# Patient Record
Sex: Female | Born: 1995 | Race: White | Hispanic: No | Marital: Single | Smoking: Never smoker
Health system: Southern US, Community
[De-identification: ages and names within clinical notes are randomized; demographics above are authoritative.]

## PROBLEM LIST (undated history)

## (undated) ENCOUNTER — Ambulatory Visit (HOSPITAL_COMMUNITY): Admission: EM | Payer: Self-pay | Source: Home / Self Care

## (undated) ENCOUNTER — Inpatient Hospital Stay (HOSPITAL_COMMUNITY): Payer: Self-pay

## (undated) DIAGNOSIS — Z789 Other specified health status: Secondary | ICD-10-CM

## (undated) DIAGNOSIS — N812 Incomplete uterovaginal prolapse: Secondary | ICD-10-CM

## (undated) HISTORY — PX: EYE SURGERY: SHX253

## (undated) HISTORY — DX: Other specified health status: Z78.9

---
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
## 2017-01-09 ENCOUNTER — Ambulatory Visit: Payer: BLUE CROSS/BLUE SHIELD | Admitting: Adult Health

## 2017-01-09 ENCOUNTER — Encounter: Payer: Self-pay | Admitting: Adult Health

## 2017-01-09 ENCOUNTER — Encounter (INDEPENDENT_AMBULATORY_CARE_PROVIDER_SITE_OTHER): Payer: Self-pay

## 2017-01-09 VITALS — BP 98/50 | HR 70 | Resp 18 | Ht 59.0 in | Wt 91.0 lb

## 2017-01-09 DIAGNOSIS — N898 Other specified noninflammatory disorders of vagina: Secondary | ICD-10-CM | POA: Diagnosis not present

## 2017-01-09 DIAGNOSIS — B373 Candidiasis of vulva and vagina: Secondary | ICD-10-CM | POA: Insufficient documentation

## 2017-01-09 DIAGNOSIS — B3731 Acute candidiasis of vulva and vagina: Secondary | ICD-10-CM

## 2017-01-09 LAB — POCT WET PREP (WET MOUNT)

## 2017-01-09 MED ORDER — FLUCONAZOLE 150 MG PO TABS: ORAL_TABLET | ORAL | 0 refills | Status: DC

## 2017-01-09 NOTE — Progress Notes (Signed)
Subjective:     Patient ID: Mary Singleton, female   DOB: 04-30-1995, 21 y.o.   MRN: [id number]  HPI Mary Singleton is a 21 year old white female, in complaining of vaginal discharge for about a week, with some burning. She says it can be yellowish.   Review of Systems +vaginal discharge +burns in vagina No sex since 4315 Reviewed past medical,surgical, social and family history. Reviewed medications and allergies.     Objective:   Physical Exam BP (!) 98/50 (BP Location: Left Arm, Patient Position: Sitting, Cuff Size: Normal)   Pulse 70   Resp 18   Ht 4\' 11"  (1.499 m)   Wt 91 lb (41.3 kg)   BMI 18.38 kg/m PHQ 2 score 0. Skin warm and dry.Pelvic: external genitalia is normal in appearance no lesions, vagina: white discharge without odor,side walls red,urethra has no lesions or masses noted, cervix:smooth and tiny, uterus: normal size, shape and contour, non tender, no masses felt, adnexa: no masses or tenderness noted. Bladder is non tender and no masses felt. Wet prep: + few yeast and +WBCs.     Assessment:       1. Vaginal discharge   2. Yeast vaginitis       Plan:    Review handout on yeast infections Rx Diflucan 150 mg #2 take 1 now and 1 in 3 days if needed F/U prn

## 2017-01-09 NOTE — Patient Instructions (Signed)

## 2017-03-12 ENCOUNTER — Other Ambulatory Visit (HOSPITAL_COMMUNITY): Payer: Self-pay | Admitting: Family Medicine

## 2017-03-12 ENCOUNTER — Ambulatory Visit (HOSPITAL_COMMUNITY)
Admission: RE | Admit: 2017-03-12 | Discharge: 2017-03-12 | Disposition: A | Discharge status: Home / Self Care | Payer: BLUE CROSS/BLUE SHIELD | Source: Ambulatory Visit | Attending: Family Medicine | Admitting: Family Medicine

## 2017-03-12 DIAGNOSIS — R1031 Right lower quadrant pain: Secondary | ICD-10-CM | POA: Diagnosis not present

## 2017-03-12 MED ORDER — IOPAMIDOL (ISOVUE-300) INJECTION 61%
100.0000 mL | Freq: Once | INTRAVENOUS | Status: AC | PRN
  Administered 2017-03-12: 100 mL via INTRAVENOUS

## 2017-03-22 ENCOUNTER — Encounter (INDEPENDENT_AMBULATORY_CARE_PROVIDER_SITE_OTHER): Payer: Self-pay | Admitting: Internal Medicine

## 2017-03-22 ENCOUNTER — Encounter (INDEPENDENT_AMBULATORY_CARE_PROVIDER_SITE_OTHER): Payer: Self-pay | Admitting: *Deleted

## 2017-03-22 ENCOUNTER — Ambulatory Visit (INDEPENDENT_AMBULATORY_CARE_PROVIDER_SITE_OTHER): Payer: BLUE CROSS/BLUE SHIELD | Admitting: Internal Medicine

## 2017-03-22 ENCOUNTER — Encounter (HOSPITAL_COMMUNITY)
Admission: RE | Admit: 2017-03-22 | Discharge: 2017-03-22 | Disposition: A | Discharge status: Home / Self Care | Payer: BLUE CROSS/BLUE SHIELD | Source: Ambulatory Visit | Attending: Internal Medicine | Admitting: Internal Medicine

## 2017-03-22 VITALS — BP 108/80 | HR 76 | Temp 98.4°F | Ht 59.0 in | Wt 91.2 lb

## 2017-03-22 DIAGNOSIS — K311 Adult hypertrophic pyloric stenosis: Secondary | ICD-10-CM | POA: Diagnosis not present

## 2017-03-22 NOTE — Progress Notes (Signed)
   Subjective:    Patient ID: Mary Singleton, female    DOB: January 14, 1996, 22 y.o.   MRN: [id number]  HPI Referred by DR. Fusco for possible outlet obstruction. Saw Dr Sherwood GamblerFusco with rt upper abdominal. She says the pain started about 2 weeks ago. She says she can feel a lump in her abdomen. No fever. No nausea or vomiting.  Having 2 -3 BMs a day.  Stools are loose and watery. Stools have been loose for about 2 weeks. Her father states her appetite is not as good as it usually is. The pain usually starts after she eats.  Occasionally takes Advil when she has her period. Denies ever having this type of pain before.  Rates the pain 6/10. She has not eaten today.      On birth control. She is not sexually active. LMP 1 day ago.   Underwent a CT abdomen/pelvis with CM for abdominal pain which revealed;  IMPRESSION: 1. No evidence of mass in the subcutaneous tissues of the infraumbilical region in the area of palpable concern. The distal stomach underlies this location. 2. Wall thickening involving the gastric antrum, duodenal bulb and descending duodenum. 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 If the patient currently has abdominal pain, gastritis and duodenitis can have a similar appearance, though this is felt less likely. 3. No acute or significant abnormalities otherwise.    Review of Systems History reviewed. No pertinent past medical history.  Past Surgical History:  Procedure Laterality Date  . EYE SURGERY      No Known Allergies  Current Outpatient Medications on File Prior to Visit  Medication Sig Dispense Refill  . JUNEL 1/20 1-20 MG-MCG tablet Take 1 tablet by mouth daily.  6   No current facility-administered medications on file prior to visit.         Objective:   Physical Exam Blood pressure  108/80, pulse 76, temperature 98.4 F (36.9 C), height 4\' 11"  (1.499 m), weight 91 lb 3.2 oz (41.4 kg), last menstrual period 02/26/2017. Alert and oriented. Skin warm and dry. Oral mucosa is moist.   . Sclera anicteric, conjunctivae is pink. Thyroid not enlarged. No cervical lymphadenopathy. Lungs clear. Heart regular rate and rhythm.  Abdomen is soft. Bowel sounds are positive. No hepatomegaly. No abdominal masses felt. Tenderness rt mid abdomen.  No edema to lower extremities.  .        Assessment & Plan:  Possible gastric out obstruction: I discussed with Dr. Karilyn Cotaehman, EGD.

## 2017-03-22 NOTE — Patient Instructions (Signed)
Mary Singleton  03/22/2017     @PREFPERIOPPHARMACY @   Your procedure is scheduled on  03/25/2017 .  Report to [facility] at   1045   A.M.  Call this number if you have problems the morning of surgery:  [phone]   Remember:  Do not eat food or drink liquids after midnight.  Take these medicines the morning of surgery with A SIP OF WATER  None   Do not wear jewelry, make-up or nail polish.  Do not wear lotions, powders, or perfumes, or deodorant.  Do not shave 48 hours prior to surgery.  Men may shave face and neck.  Do not bring valuables to the hospital.  [facility] is not responsible for any belongings or valuables.  Contacts, dentures or bridgework may not be worn into surgery.  Leave your suitcase in the car.  After surgery it may be brought to your room.  For patients admitted to the hospital, discharge time will be determined by your treatment team.  Patients discharged the day of surgery will not be allowed to drive home.   Name and phone number of your driver:   family Special instructions:  Follow the diet instructions given to you by Dr Patty Sermons office.  Please read over the following fact sheets that you were given. Anesthesia Post-op Instructions and Care and Recovery After Surgery       Esophagogastroduodenoscopy Esophagogastroduodenoscopy (EGD) is a procedure to examine the lining of the esophagus, stomach, and first part of the small intestine (duodenum). This procedure is done to check for problems such as inflammation, bleeding, ulcers, or growths. During this procedure, a long, flexible, lighted tube with a camera attached (endoscope) is inserted down the throat. Tell a health care provider about:  Any allergies you have.  All medicines you are taking, including vitamins, herbs, eye drops, creams, and over-the-counter medicines.  Any problems you or family members have had with anesthetic medicines.  Any blood disorders you  have.  Any surgeries you have had.  Any medical conditions you have.  Whether you are pregnant or may be pregnant. What are the risks? Generally, this is a safe procedure. However, problems may occur, including:  Infection.  Bleeding.  A tear (perforation) in the esophagus, stomach, or duodenum.  Trouble breathing.  Excessive sweating.  Spasms of the larynx.  A slowed heartbeat.  Low blood pressure.  What happens before the procedure?  Follow instructions from your health care provider about eating or drinking restrictions.  Ask your health care provider about: ? Changing or stopping your regular medicines. This is especially important if you are taking diabetes medicines or blood thinners. ? Taking medicines such as aspirin and ibuprofen. These medicines can thin your blood. Do not take these medicines before your procedure if your health care provider instructs you not to.  Plan to have someone take you home after the procedure.  If you wear dentures, be ready to remove them before the procedure. What happens during the procedure?  To reduce your risk of infection, your health care team will wash or sanitize their hands.  An IV tube will be put in a vein in your hand or arm. You will get medicines and fluids through this tube.  You will be given one or more of the following: ? A medicine to help you relax (sedative). ? A medicine to numb the area (local anesthetic). This medicine may be sprayed into  your throat. It will make you feel more comfortable and keep you from gagging or coughing during the procedure. ? A medicine for pain.  A mouth guard may be placed in your mouth to protect your teeth and to keep you from biting on the endoscope.  You will be asked to lie on your left side.  The endoscope will be lowered down your throat into your esophagus, stomach, and duodenum.  Air will be put into the endoscope. This will help your health care provider see  better.  The lining of your esophagus, stomach, and duodenum will be examined.  Your health care provider may: ? Take a tissue sample so it can be looked at in a lab (biopsy). ? Remove growths. ? Remove objects (foreign bodies) that are stuck. ? Treat any bleeding with medicines or other devices that stop tissue from bleeding. ? Widen (dilate) or stretch narrowed areas of your esophagus and stomach.  The endoscope will be taken out. The procedure may vary among health care providers and hospitals. What happens after the procedure?  Your blood pressure, heart rate, breathing rate, and blood oxygen level will be monitored often until the medicines you were given have worn off.  Do not eat or drink anything until the numbing medicine has worn off and your gag reflex has returned. This information is not intended to replace advice given to you by your health care provider. Make sure you discuss any questions you have with your health care provider. Document Released: 06/22/2004 Document Revised: 07/28/2015 Document Reviewed: 01/13/2015 Elsevier Interactive Patient Education  2018 [employer]. Esophagogastroduodenoscopy, Care After Refer to this sheet in the next few weeks. These instructions provide you with information about caring for yourself after your procedure. Your health care provider may also give you more specific instructions. Your treatment has been planned according to current medical practices, but problems sometimes occur. Call your health care provider if you have any problems or questions after your procedure. What can I expect after the procedure? After the procedure, it is common to have:  A sore throat.  Nausea.  Bloating.  Dizziness.  Fatigue.  Follow these instructions at home:  Do not eat or drink anything until the numbing medicine (local anesthetic) has worn off and your gag reflex has returned. You will know that the local anesthetic has worn off when you  can swallow comfortably.  Do not drive for 24 hours if you received a medicine to help you relax (sedative).  If your health care provider took a tissue sample for testing during the procedure, make sure to get your test results. This is your responsibility. Ask your health care provider or the department performing the test when your results will be ready.  Keep all follow-up visits as told by your health care provider. This is important. Contact a health care provider if:  You cannot stop coughing.  You are not urinating.  You are urinating less than usual. Get help right away if:  You have trouble swallowing.  You cannot eat or drink.  You have throat or chest pain that gets worse.  You are dizzy or light-headed.  You faint.  You have nausea or vomiting.  You have chills.  You have a fever.  You have severe abdominal pain.  You have black, tarry, or bloody stools. This information is not intended to replace advice given to you by your health care provider. Make sure you discuss any questions you have with your health care  provider. Document Released: 02/06/2012 Document Revised: 07/28/2015 Document Reviewed: 01/13/2015 Elsevier Interactive Patient Education  2018 [employer]  Monitored Anesthesia Care Anesthesia is a term that refers to techniques, procedures, and medicines that help a person stay safe and comfortable during a medical procedure. Monitored anesthesia care, or sedation, is one type of anesthesia. Your anesthesia specialist may recommend sedation if you will be having a procedure that does not require you to be unconscious, such as:  Cataract surgery.  A dental procedure.  A biopsy.  A colonoscopy.  During the procedure, you may receive a medicine to help you relax (sedative). There are three levels of sedation:  Mild sedation. At this level, you may feel awake and relaxed. You will be able to follow directions.  Moderate sedation. At this  level, you will be sleepy. You may not remember the procedure.  Deep sedation. At this level, you will be asleep. You will not remember the procedure.  The more medicine you are given, the deeper your level of sedation will be. Depending on how you respond to the procedure, the anesthesia specialist may change your level of sedation or the type of anesthesia to fit your needs. An anesthesia specialist will monitor you closely during the procedure. Let your health care provider know about:  Any allergies you have.  All medicines you are taking, including vitamins, herbs, eye drops, creams, and over-the-counter medicines.  Any use of steroids (by mouth or as a cream).  Any problems you or family members have had with sedatives and anesthetic medicines.  Any blood disorders you have.  Any surgeries you have had.  Any medical conditions you have, such as sleep apnea.  Whether you are pregnant or may be pregnant.  Any use of cigarettes, alcohol, or street drugs. What are the risks? Generally, this is a safe procedure. However, problems may occur, including:  Getting too much medicine (oversedation).  Nausea.  Allergic reaction to medicines.  Trouble breathing. If this happens, a breathing tube may be used to help with breathing. It will be removed when you are awake and breathing on your own.  Heart trouble.  Lung trouble.  Before the procedure Staying hydrated Follow instructions from your health care provider about hydration, which may include:  Up to 2 hours before the procedure - you may continue to drink clear liquids, such as water, clear fruit juice, black coffee, and plain tea.  Eating and drinking restrictions Follow instructions from your health care provider about eating and drinking, which may include:  8 hours before the procedure - stop eating heavy meals or foods such as meat, fried foods, or fatty foods.  6 hours before the procedure - stop eating light  meals or foods, such as toast or cereal.  6 hours before the procedure - stop drinking milk or drinks that contain milk.  2 hours before the procedure - stop drinking clear liquids.  Medicines Ask your health care provider about:  Changing or stopping your regular medicines. This is especially important if you are taking diabetes medicines or blood thinners.  Taking medicines such as aspirin and ibuprofen. These medicines can thin your blood. Do not take these medicines before your procedure if your health care provider instructs you not to.  Tests and exams  You will have a physical exam.  You may have blood tests done to show: ? How well your kidneys and liver are working. ? How well your blood can clot.  General instructions  Plan to  have someone take you home from the hospital or clinic.  If you will be going home right after the procedure, plan to have someone with you for 24 hours.  What happens during the procedure?  Your blood pressure, heart rate, breathing, level of pain and overall condition will be monitored.  An IV tube will be inserted into one of your veins.  Your anesthesia specialist will give you medicines as needed to keep you comfortable during the procedure. This may mean changing the level of sedation.  The procedure will be performed. After the procedure  Your blood pressure, heart rate, breathing rate, and blood oxygen level will be monitored until the medicines you were given have worn off.  Do not drive for 24 hours if you received a sedative.  You may: ? Feel sleepy, clumsy, or nauseous. ? Feel forgetful about what happened after the procedure. ? Have a sore throat if you had a breathing tube during the procedure. ? Vomit. This information is not intended to replace advice given to you by your health care provider. Make sure you discuss any questions you have with your health care provider. Document Released: 11/15/2004 Document Revised:  07/29/2015 Document Reviewed: 06/12/2015 Elsevier Interactive Patient Education  2018 [employer]. Monitored Anesthesia Care, Care After These instructions provide you with information about caring for yourself after your procedure. Your health care provider may also give you more specific instructions. Your treatment has been planned according to current medical practices, but problems sometimes occur. Call your health care provider if you have any problems or questions after your procedure. What can I expect after the procedure? After your procedure, it is common to:  Feel sleepy for several hours.  Feel clumsy and have poor balance for several hours.  Feel forgetful about what happened after the procedure.  Have poor judgment for several hours.  Feel nauseous or vomit.  Have a sore throat if you had a breathing tube during the procedure.  Follow these instructions at home: For at least 24 hours after the procedure:   Do not: ? Participate in activities in which you could fall or become injured. ? Drive. ? Use heavy machinery. ? Drink alcohol. ? Take sleeping pills or medicines that cause drowsiness. ? Make important decisions or sign legal documents. ? Take care of children on your own.  Rest. Eating and drinking  Follow the diet that is recommended by your health care provider.  If you vomit, drink water, juice, or soup when you can drink without vomiting.  Make sure you have little or no nausea before eating solid foods. General instructions  Have a responsible adult stay with you until you are awake and alert.  Take over-the-counter and prescription medicines only as told by your health care provider.  If you smoke, do not smoke without supervision.  Keep all follow-up visits as told by your health care provider. This is important. Contact a health care provider if:  You keep feeling nauseous or you keep vomiting.  You feel light-headed.  You develop a  rash.  You have a fever. Get help right away if:  You have trouble breathing. This information is not intended to replace advice given to you by your health care provider. Make sure you discuss any questions you have with your health care provider. Document Released: 06/12/2015 Document Revised: 10/12/2015 Document Reviewed: 06/12/2015 Elsevier Interactive Patient Education  [employer].

## 2017-03-22 NOTE — Progress Notes (Signed)
Pt. Arrived for pre-op interview with her father.  Upon assessment pt. Stated that she did not understand procedure & did not want to have it done.  Pt. Became very tearful & emotional. Father was very adamant that she needed to have the procedure done.  I explained to the pt. & father that it was ultimately her decision & that I would get Dr. Karilyn Cotaehman to come & speak with her.  Dr. Karilyn Cotaehman came & talked to the pt. & her father.  And it was decided that the procedure could held off for now.  Pt. Was agreeable.

## 2017-03-25 ENCOUNTER — Ambulatory Visit (HOSPITAL_COMMUNITY)
Admission: RE | Admit: 2017-03-25 | Payer: BLUE CROSS/BLUE SHIELD | Source: Ambulatory Visit | Admitting: Internal Medicine

## 2017-03-25 ENCOUNTER — Encounter (HOSPITAL_COMMUNITY): Admission: RE | Payer: Self-pay | Source: Ambulatory Visit

## 2017-03-25 SURGERY — ESOPHAGOGASTRODUODENOSCOPY (EGD) WITH PROPOFOL
Anesthesia: Monitor Anesthesia Care

## 2017-07-11 ENCOUNTER — Other Ambulatory Visit (INDEPENDENT_AMBULATORY_CARE_PROVIDER_SITE_OTHER): Payer: Self-pay | Admitting: Internal Medicine

## 2017-07-11 ENCOUNTER — Encounter (INDEPENDENT_AMBULATORY_CARE_PROVIDER_SITE_OTHER): Payer: Self-pay | Admitting: Internal Medicine

## 2017-07-11 ENCOUNTER — Ambulatory Visit (INDEPENDENT_AMBULATORY_CARE_PROVIDER_SITE_OTHER): Payer: BLUE CROSS/BLUE SHIELD | Admitting: Internal Medicine

## 2017-07-11 VITALS — BP 118/74 | HR 80 | Temp 98.2°F | Ht 59.0 in | Wt 86.5 lb

## 2017-07-11 DIAGNOSIS — K267 Chronic duodenal ulcer without hemorrhage or perforation: Secondary | ICD-10-CM | POA: Diagnosis not present

## 2017-07-11 DIAGNOSIS — K311 Adult hypertrophic pyloric stenosis: Secondary | ICD-10-CM

## 2017-07-11 NOTE — Progress Notes (Signed)
   Subjective:    Patient ID: Mary Singleton, female    DOB: 02-27-1996, 22 y.o.   MRN: [id number]  HPIHere today for f/u. Last seen in January as a new referral for possible outlet obstruction.  She was scheduled for an EGD but cancelled the procedure. She tells me she still has a "knot" in her abdomen. She is having pain in her epigastric region.  She is eating okay. She is having a BM daily. Stools are formed. She states she is ready to go ahead and have the procedure done.  Rates pain 6/10.  She rarely take ASA.  Takes Advil when she has a period (1) a day.    Underwent a CT abdomen/pelvis with CM for abdominal pain which revealed;  IMPRESSION: 1. No evidence of mass in the subcutaneous tissues of the infraumbilical region in the area of palpable concern. The distal stomach underlies this location. 2. Wall thickening involving the gastric antrum, duodenal bulb and descending duodenum. 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 If the patient currently has abdominal pain, gastritis and duodenitis can have a similar appearance, though this is felt less likely. 3. No acute or significant abnormalities otherwise.  Review of Systems History reviewed. No pertinent past medical history.  Past Surgical History:  Procedure Laterality Date  . EYE SURGERY      No Known Allergies  Current Outpatient Medications on File Prior to Visit  Medication Sig Dispense Refill  . JUNEL 1/20 1-20 MG-MCG tablet Take 1 tablet by mouth daily.  6   No current facility-administered medications on file prior to visit.         Objective:   Physical Exam Blood pressure 118/74, pulse 80, temperature 98.2 F (36.8 C), height  (1.499 m), weight 86 lb 8 oz (39.2 kg). Alert and oriented. Skin warm and dry. Oral mucosa is moist.   . Sclera  anicteric, conjunctivae is pink. Thyroid not enlarged. No cervical lymphadenopathy. Lungs clear. Heart regular rate and rhythm.  Abdomen is soft. Bowel sounds are positive. No hepatomegaly. No abdominal masses   Tenderness just below umbilicus.  No edema to lower extremities.         Assessment & Plan:  Possible outlet obstruction. Needs EGD with propofol.

## 2017-07-12 ENCOUNTER — Encounter (INDEPENDENT_AMBULATORY_CARE_PROVIDER_SITE_OTHER): Payer: Self-pay | Admitting: *Deleted

## 2017-08-12 ENCOUNTER — Encounter (HOSPITAL_COMMUNITY)
Admission: RE | Admit: 2017-08-12 | Discharge: 2017-08-12 | Disposition: A | Discharge status: Home / Self Care | Payer: BLUE CROSS/BLUE SHIELD | Source: Ambulatory Visit | Attending: Internal Medicine | Admitting: Internal Medicine

## 2017-08-12 ENCOUNTER — Other Ambulatory Visit: Payer: Self-pay

## 2017-08-12 ENCOUNTER — Encounter (HOSPITAL_COMMUNITY): Payer: Self-pay

## 2017-08-12 DIAGNOSIS — K311 Adult hypertrophic pyloric stenosis: Secondary | ICD-10-CM | POA: Diagnosis not present

## 2017-08-12 DIAGNOSIS — Z01812 Encounter for preprocedural laboratory examination: Secondary | ICD-10-CM | POA: Diagnosis present

## 2017-08-12 LAB — CBC WITH DIFFERENTIAL/PLATELET
BASOS PCT: 0 %
Basophils Absolute: 0 10*3/uL (ref 0.0–0.1)
EOS ABS: 0.3 10*3/uL (ref 0.0–0.7)
EOS PCT: 4 %
HCT: 38.9 % (ref 36.0–46.0)
HEMOGLOBIN: 12.3 g/dL (ref 12.0–15.0)
LYMPHS ABS: 2.8 10*3/uL (ref 0.7–4.0)
Lymphocytes Relative: 37 %
MCH: 29.1 pg (ref 26.0–34.0)
MCHC: 31.6 g/dL (ref 30.0–36.0)
MCV: 92.2 fL (ref 78.0–100.0)
MONOS PCT: 6 %
Monocytes Absolute: 0.5 10*3/uL (ref 0.1–1.0)
NEUTROS PCT: 52 %
Neutro Abs: 3.9 10*3/uL (ref 1.7–7.7)
PLATELETS: 247 10*3/uL (ref 150–400)
RBC: 4.22 MIL/uL (ref 3.87–5.11)
RDW: 13.2 % (ref 11.5–15.5)
WBC: 7.6 10*3/uL (ref 4.0–10.5)

## 2017-08-12 LAB — HCG, SERUM, QUALITATIVE: PREG SERUM: NEGATIVE

## 2017-08-16 ENCOUNTER — Ambulatory Visit (HOSPITAL_COMMUNITY): Payer: BLUE CROSS/BLUE SHIELD | Admitting: Anesthesiology

## 2017-08-16 ENCOUNTER — Encounter (HOSPITAL_COMMUNITY): Payer: Self-pay | Admitting: *Deleted

## 2017-08-16 ENCOUNTER — Encounter (HOSPITAL_COMMUNITY)
Admission: RE | Disposition: A | Discharge status: Home / Self Care | Payer: Self-pay | Source: Ambulatory Visit | Attending: Internal Medicine

## 2017-08-16 ENCOUNTER — Ambulatory Visit (HOSPITAL_COMMUNITY)
Admission: RE | Admit: 2017-08-16 | Discharge: 2017-08-16 | Disposition: A | Discharge status: Home / Self Care | Payer: BLUE CROSS/BLUE SHIELD | Source: Ambulatory Visit | Attending: Internal Medicine | Admitting: Internal Medicine

## 2017-08-16 DIAGNOSIS — Z809 Family history of malignant neoplasm, unspecified: Secondary | ICD-10-CM | POA: Insufficient documentation

## 2017-08-16 DIAGNOSIS — K449 Diaphragmatic hernia without obstruction or gangrene: Secondary | ICD-10-CM | POA: Insufficient documentation

## 2017-08-16 DIAGNOSIS — R933 Abnormal findings on diagnostic imaging of other parts of digestive tract: Secondary | ICD-10-CM | POA: Diagnosis not present

## 2017-08-16 DIAGNOSIS — F419 Anxiety disorder, unspecified: Secondary | ICD-10-CM | POA: Diagnosis not present

## 2017-08-16 DIAGNOSIS — R1013 Epigastric pain: Secondary | ICD-10-CM | POA: Insufficient documentation

## 2017-08-16 DIAGNOSIS — K311 Adult hypertrophic pyloric stenosis: Secondary | ICD-10-CM | POA: Insufficient documentation

## 2017-08-16 DIAGNOSIS — K228 Other specified diseases of esophagus: Secondary | ICD-10-CM | POA: Diagnosis not present

## 2017-08-16 HISTORY — PX: BIOPSY: SHX5522

## 2017-08-16 HISTORY — PX: ESOPHAGOGASTRODUODENOSCOPY (EGD) WITH PROPOFOL: SHX5813

## 2017-08-16 SURGERY — ESOPHAGOGASTRODUODENOSCOPY (EGD) WITH PROPOFOL
Anesthesia: Monitor Anesthesia Care

## 2017-08-16 MED ORDER — PROMETHAZINE HCL 25 MG/ML IJ SOLN: 6.2500 mg | INTRAMUSCULAR | Status: DC | PRN

## 2017-08-16 MED ORDER — MIDAZOLAM HCL 5 MG/5ML IJ SOLN
INTRAMUSCULAR | Status: DC | PRN
  Administered 2017-08-16: 2 mg via INTRAVENOUS
  Administered 2017-08-16 (×2): 1 mg via INTRAVENOUS

## 2017-08-16 MED ORDER — CHLORHEXIDINE GLUCONATE CLOTH 2 % EX PADS: 6.0000 | MEDICATED_PAD | Freq: Once | CUTANEOUS | Status: DC

## 2017-08-16 MED ORDER — SODIUM CHLORIDE 0.9 % IJ SOLN
INTRAMUSCULAR | Status: AC
  Filled 2017-08-16: qty 10

## 2017-08-16 MED ORDER — PROPOFOL 500 MG/50ML IV EMUL
INTRAVENOUS | Status: DC | PRN
  Administered 2017-08-16: 100 ug/kg/min via INTRAVENOUS

## 2017-08-16 MED ORDER — HYDROMORPHONE HCL 1 MG/ML IJ SOLN: 0.2500 mg | INTRAMUSCULAR | Status: DC | PRN

## 2017-08-16 MED ORDER — MIDAZOLAM HCL 2 MG/2ML IJ SOLN
INTRAMUSCULAR | Status: AC
  Filled 2017-08-16: qty 2

## 2017-08-16 MED ORDER — MEPERIDINE HCL 100 MG/ML IJ SOLN: 6.2500 mg | INTRAMUSCULAR | Status: DC | PRN

## 2017-08-16 MED ORDER — HYDROCODONE-ACETAMINOPHEN 7.5-325 MG PO TABS: 1.0000 | ORAL_TABLET | Freq: Once | ORAL | Status: DC | PRN

## 2017-08-16 MED ORDER — GLYCOPYRROLATE 0.2 MG/ML IJ SOLN
INTRAMUSCULAR | Status: AC
  Filled 2017-08-16: qty 1

## 2017-08-16 MED ORDER — LACTATED RINGERS IV SOLN: INTRAVENOUS | Status: DC

## 2017-08-16 MED ORDER — MIDAZOLAM HCL 2 MG/2ML IJ SOLN: INTRAMUSCULAR | Status: DC | PRN

## 2017-08-16 MED ORDER — PROPOFOL 10 MG/ML IV BOLUS
INTRAVENOUS | Status: AC
  Filled 2017-08-16: qty 20

## 2017-08-16 MED ORDER — LACTATED RINGERS IV SOLN
INTRAVENOUS | Status: DC
  Administered 2017-08-16: 07:00:00 via INTRAVENOUS

## 2017-08-16 MED ORDER — EPHEDRINE SULFATE 50 MG/ML IJ SOLN
INTRAMUSCULAR | Status: AC
  Filled 2017-08-16: qty 1

## 2017-08-16 MED ORDER — GLYCOPYRROLATE 0.2 MG/ML IJ SOLN
INTRAMUSCULAR | Status: DC | PRN
  Administered 2017-08-16: 0.2 mg via INTRAVENOUS

## 2017-08-16 NOTE — Op Note (Signed)
[facility] Patient Name: Mary KidneyChristen Mower Procedure Date: 08/16/2017 8:30 AM MRN: [id number] Date of Birth: Nov 26, 1995 Attending MD: Lionel DecemberNajeeb Marney Treloar , MD CSN: [id number] Age: [age] Admit Type: Outpatient Procedure:                Upper GI endoscopy Indications:              Epigastric abdominal pain, Abnormal CT of the GI                            tract Providers:                Lionel DecemberNajeeb Matisse Roskelley, MD, Loma MessingLurae B. Patsy LagerAlbert RN, RN, Burke Keelsrisann                            Tilley, Technician Referring MD:             Elfredia NevinsLawrence Fusco, MD Medicines:                Propofol per Anesthesia Complications:            No immediate complications. Estimated Blood Loss:     Estimated blood loss was minimal. Procedure:                Pre-Anesthesia Assessment:                           - Prior to the procedure, a History and Physical                            was performed, and patient medications and                            allergies were reviewed. The patient's tolerance of                            previous anesthesia was also reviewed. The risks                            and benefits of the procedure and the sedation                            options and risks were discussed with the patient.                            All questions were answered, and informed consent                            was obtained. Prior Anticoagulants: The patient has                            taken no previous anticoagulant or antiplatelet                            agents. ASA Grade Assessment: II - A patient with  mild systemic disease. After reviewing the risks                            and benefits, the patient was deemed in                            satisfactory condition to undergo the procedure.                           After obtaining informed consent, the endoscope was                            passed under direct vision. Throughout the                            procedure, the  patient's blood pressure, pulse, and                            oxygen saturations were monitored continuously. The                            EG-[id number] ([id number]) scope was introduced through the                            and advanced to the third part of duodenum. The                            upper GI endoscopy was accomplished without                            difficulty. The patient tolerated the procedure                            well. Scope In: 8:56:33 AM Scope Out: 9:06:02 AM Total Procedure Duration: 0 hours 9 minutes 29 seconds  Findings:      The examined esophagus was normal.      The Z-line was irregular and was found 35 cm from the incisors.      A 2 cm hiatal hernia was present.      The entire examined stomach was normal.      The duodenal bulb was normal.      Dilated 2nd and 3rd part of duodenum. This was biopsied with a cold       forceps for histology. Impression:               - Normal esophagus.                           - Z-line irregular, 35 cm from the incisors.                           - 2 cm hiatal hernia.                           - Dilated but normal stomach.                           -  Normal duodenal bulb.                           -Dilated 2nd and 3rd part od duodenum. Biopsied.                           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 The signs and symptoms of potential                            delayed complications were discussed with the                            patient. Return to normal activities tomorrow.                            Written discharge instructions were provided to the                            patient.                           - Resume previous diet today.                           - Continue present medications.                           - Await pathology  results. Procedure Code(s):        --- Professional ---                           [phone], Esophagogastroduodenoscopy, flexible,                            transoral; with biopsy, single or multiple Diagnosis Code(s):        --- Professional ---                           K22.8, Other specified diseases of esophagus                           K44.9, Diaphragmatic hernia without obstruction or                            gangrene                           R10.13, Epigastric pain                           R93.3, Abnormal findings on diagnostic imaging of  other parts of digestive tract CPT copyright 2017 American Medical Association. All rights reserved. The codes documented in this report are preliminary and upon coder review may  be revised to meet current compliance requirements. Lionel December, MD Lionel December, MD 08/16/2017 9:19:56 AM This report has been signed electronically. Number of Addenda: 0

## 2017-08-16 NOTE — H&P (Signed)
Mary Singleton is an 22 y.o. female.   Chief Complaint: Patient is here for EGD. HPI: Patient is 22 year old Caucasian female who according to her father has had stomach problems for years.  She has been having postprandial epigastric pain and has noted lump in her upper abdomen.  She has sporadic vomiting when she vomited food that she just seeking.  There is no history of hematemesis melena or rectal bleeding.  She does not take OTC NSAIDs.  She had abdominopelvic CT on 03/12/2017 revealing markedly dilated stomach with wall thickening to gastric antrum bulb and descending duodenum.  This study also suggested not correct her duodenum.  History reviewed. No pertinent past medical history.  Past Surgical History:  Procedure Laterality Date  . EYE SURGERY      Family History  Problem Relation Age of Onset  . Cancer Maternal Grandmother    Social History:  reports that she has never smoked. She has never used smokeless tobacco. She reports that she does not drink alcohol or use drugs.  Allergies: No Known Allergies  Medications Prior to Admission  Medication Sig Dispense Refill  . JUNEL 1/20 1-20 MG-MCG tablet Take 1 tablet by mouth daily.  6    No results found for this or any previous visit (from the past 48 hour(s)). No results found.  ROS  Blood pressure 113/71, pulse 95, resp. rate 15, last menstrual period 08/05/2017, SpO2 100 %. Physical Exam  Constitutional:  Thin Caucasian female who appears quite anxious.  HENT:  Mouth/Throat: Oropharynx is clear and moist.  Eyes: Conjunctivae are normal.  Neck: No thyromegaly present.  Cardiovascular: Normal rate, regular rhythm and normal heart sounds.  No murmur heard. Respiratory: Effort normal and breath sounds normal.  GI:  Abdomen is flat.  It is soft with mild tenderness below the right costal margin.  No organomegaly or masses.  Musculoskeletal: She exhibits no edema.  Lymphadenopathy:    She has no cervical adenopathy.   Neurological: She is alert.  Skin: Skin is warm and dry.     Assessment/Plan Epigastric pain nausea and vomiting. Markedly dilated stomach with thickening to antral and duodenal wall. Diagnostic EGD.  Lionel DecemberNajeeb Rehman, MD 08/16/2017, 7:32 AM

## 2017-08-16 NOTE — Anesthesia Preprocedure Evaluation (Signed)
Anesthesia Evaluation  Patient identified by MRN, date of birth, ID band Patient awake    Reviewed: Allergy & Precautions, H&P , NPO status , Patient's Chart, lab work & pertinent test results, reviewed documented beta blocker date and time   Airway Mallampati: II  TM Distance: >3 FB Neck ROM: full    Dental no notable dental hx. 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 Father denies any pre-existing medical hx, no home meds  Reproductive/Obstetrics negative OB ROS                             Anesthesia Physical Anesthesia Plan  ASA: II  Anesthesia Plan: MAC   Post-op Pain Management:    Induction:   PONV Risk Score and Plan:   Airway Management Planned:   Additional Equipment:   Intra-op Plan:   Post-operative Plan:   Informed Consent: I have reviewed the patients History and Physical, chart, labs and discussed the procedure including the risks, benefits and alternatives for the proposed anesthesia with the patient or authorized representative who has indicated his/her understanding and acceptance.   Dental Advisory Given  Plan Discussed with: CRNA and Anesthesiologist  Anesthesia Plan Comments:         Anesthesia Quick Evaluation

## 2017-08-16 NOTE — Anesthesia Postprocedure Evaluation (Signed)
Anesthesia Post Note  Patient: Mary Singleton  Procedure(s) Performed: ESOPHAGOGASTRODUODENOSCOPY (EGD) WITH PROPOFOL (N/A ) BIOPSY  Patient location during evaluation: PACU Anesthesia Type: MAC Level of consciousness: awake and patient cooperative Pain management: pain level controlled Vital Signs Assessment: post-procedure vital signs reviewed and stable Respiratory status: spontaneous breathing, nonlabored ventilation and respiratory function stable Cardiovascular status: blood pressure returned to baseline Postop Assessment: no apparent nausea or vomiting Anesthetic complications: no     Last Vitals:  Vitals:   08/16/17 0835 08/16/17 0840  BP:    Pulse:    Resp: 16 15  SpO2: 100% 100%    Last Pain:  Vitals:   08/16/17 0851  PainSc: 0-No pain                 Calista Crain J

## 2017-08-16 NOTE — Transfer of Care (Signed)
Immediate Anesthesia Transfer of Care Note  Patient: Mary Singleton  Procedure(s) Performed: ESOPHAGOGASTRODUODENOSCOPY (EGD) WITH PROPOFOL (N/A ) BIOPSY  Patient Location: PACU  Anesthesia Type:MAC  Level of Consciousness: awake and patient cooperative  Airway & Oxygen Therapy: Patient Spontanous Breathing  Post-op Assessment: Report given to RN, Post -op Vital signs reviewed and stable and Patient moving all extremities  Post vital signs: Reviewed and stable  Last Vitals:  Vitals Value Taken Time  BP    Temp    Pulse    Resp    SpO2      Last Pain:  Vitals:   08/16/17 0851  PainSc: 0-No pain      Patients Stated Pain Goal: 5 (11/55/20 8022)  Complications: No apparent anesthesia complications

## 2017-08-16 NOTE — Discharge Instructions (Addendum)
No aspirin or NSAIDs for 24 hours Resume usual medications and diet as before. No driving for 24 hours. Physician will call with biopsy results. 

## 2017-08-23 ENCOUNTER — Encounter (HOSPITAL_COMMUNITY): Payer: Self-pay | Admitting: Internal Medicine

## 2017-12-06 ENCOUNTER — Ambulatory Visit: Payer: BLUE CROSS/BLUE SHIELD | Admitting: Adult Health

## 2017-12-23 ENCOUNTER — Ambulatory Visit: Payer: BLUE CROSS/BLUE SHIELD | Admitting: Adult Health

## 2017-12-23 ENCOUNTER — Encounter: Payer: Self-pay | Admitting: Adult Health

## 2017-12-23 ENCOUNTER — Other Ambulatory Visit: Payer: Self-pay

## 2017-12-23 VITALS — BP 110/68 | HR 94 | Ht 59.0 in | Wt 89.0 lb

## 2017-12-23 DIAGNOSIS — N361 Urethral diverticulum: Secondary | ICD-10-CM

## 2017-12-23 NOTE — Progress Notes (Signed)
  Subjective:     Patient ID: Mary Singleton, female   DOB: 08-Jan-1996, 22 y.o.   MRN: [id number]  HPI Demetris is a 22 year old white female in complaining of lump in vagina, has noticed about 8 months.Occasional discomfort. Her boyfriend is with her.  Review of Systems +lump in vagina Reviewed past medical,surgical, social and family history. Reviewed medications and allergies.     Objective:   Physical Exam BP 110/68 (BP Location: Right Arm, Patient Position: Sitting, Cuff Size: Normal)   Pulse 94   Ht 4\' 11"  (1.499 m)   Wt 89 lb (40.4 kg)   LMP 12/17/2017   BMI 17.98 kg/m  Skin warm and dry.Pelvic: external genitalia is normal in appearance no lesions, vagina: pink with good moisture and rugae,urethra has no lesions, has small mid uretal diverticulum, cervix is nulliparous, everted at os, deviated to right and low, no CMT, uterus: normal size, shape and contour, non tender, no masses felt, adnexa: no masses or tenderness noted. Bladder is non tender and no masses felt. Co-exam with Dr Despina Hidden.  Tried to explain to pt and partner.     Assessment:     1. Urethral diverticulum       Plan:     F/U prn

## 2018-02-12 ENCOUNTER — Telehealth (INDEPENDENT_AMBULATORY_CARE_PROVIDER_SITE_OTHER): Payer: Self-pay | Admitting: *Deleted

## 2018-02-12 NOTE — Telephone Encounter (Signed)
Patient currently has appointment with Terri on 02/20/2018, may change this for her to see Dr.Rehman , per Dr. Karilyn Cotaehman.

## 2018-02-20 ENCOUNTER — Ambulatory Visit (INDEPENDENT_AMBULATORY_CARE_PROVIDER_SITE_OTHER): Payer: BLUE CROSS/BLUE SHIELD | Admitting: Internal Medicine

## 2018-03-04 ENCOUNTER — Ambulatory Visit (INDEPENDENT_AMBULATORY_CARE_PROVIDER_SITE_OTHER): Payer: BLUE CROSS/BLUE SHIELD | Admitting: Internal Medicine

## 2018-03-11 ENCOUNTER — Encounter (INDEPENDENT_AMBULATORY_CARE_PROVIDER_SITE_OTHER): Payer: Self-pay | Admitting: Internal Medicine

## 2018-03-11 ENCOUNTER — Ambulatory Visit (INDEPENDENT_AMBULATORY_CARE_PROVIDER_SITE_OTHER): Payer: BLUE CROSS/BLUE SHIELD | Admitting: Internal Medicine

## 2018-03-11 VITALS — BP 98/70 | HR 74 | Temp 97.9°F | Resp 18 | Ht 59.0 in | Wt 93.9 lb

## 2018-03-11 DIAGNOSIS — K5900 Constipation, unspecified: Secondary | ICD-10-CM

## 2018-03-11 DIAGNOSIS — K551 Chronic vascular disorders of intestine: Secondary | ICD-10-CM | POA: Diagnosis not present

## 2018-03-11 MED ORDER — BISACODYL 10 MG RE SUPP: 10.0000 mg | RECTAL | 0 refills | Status: DC | PRN

## 2018-03-11 NOTE — Progress Notes (Signed)
Presenting complaint;  Follow-up for SMA syndrome. Patient complains of constipation.  Database and subjective:  Patient is 23 year old Caucasian female who is here for scheduled visit accompanied by her father. She was initially seen 1 year ago for 2-week history of epigastric pain and CT revealed dilated stomach with antral and duodenal wall thickening.  There was question of SMA syndrome on CT.  Or she did not had any vomiting.  It was decided to monitor patient's symptoms.  She returned in May last year and was complaining of postprandial fullness not in her upper abdomen.  She therefore underwent EGD in June 2019.  She was found to have small sliding hiatal hernia without changes of reflux esophagitis large stomach but mucosa was normal.  Pyloric channel was patent.  Bowel was normal.  Second and third part of duodenum was dilated but no mucosal abnormalities noted biopsy was similarly unremarkable.  Patient is a new patient was advised to try to gain weight.  She was also complaining of constipation and was advised to try Colace and MiraLAX on as-needed basis.  She states her appetite is fair.  She eats 1 meal a day.  She eats snacks in between.  She has gained 7 pounds since her last visit.  She denies postprandial fullness nausea vomiting or pain.  She complains of constipation.  She says she did very well for a while but she has had problems for about 2 to 3 months.  She can go as long as 1 week without a bowel movement.  If she goes too many days her stool becomes hard and she is has difficulty expelling it.  She denies melena or rectal bleeding. She is planning to go back to school.  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 Her father is very concerned about her short stature.  He states he also had growth issues.  He recalls he was sent to [facility] and was given some shots and gained some height.  She is never been seen by an endocrinologist.  Current Medications: Outpatient Encounter  Medications as of 03/11/2018  Medication Sig  . JUNEL 1/20 1-20 MG-MCG tablet Take 1 tablet by mouth daily.   No facility-administered encounter medications on file as of 03/11/2018.      Objective: Blood pressure 98/70, pulse 74, temperature 97.9 F (36.6 C), temperature source Oral, resp. rate 18, height 4\' 11"  (1.499 m), weight 93 lb 14.4 oz (42.6 kg). Patient is alert and in no acute distress. Conjunctiva is pink. Sclera is nonicteric Oropharyngeal mucosa is normal. No neck masses or thyromegaly noted. Cardiac exam with regular rhythm normal S1 and S2. No murmur or gallop noted. Lungs are clear to auscultation. Abdomen is symmetrical.  On palpation is soft and nontender.  No succussion splash noted over epigastric region. No LE edema or clubbing noted.   Assessment:  #1.  Superior mesenteric artery syndrome.  She has quite impressive for focal narrowing involving third part of the duodenum with aorta in the back and SMA in the front.  However she does not have any symptoms to suggest outlet syndrome.  It is reassuring to note that she has gained few pounds.  Hopefully she can get a few more pounds.  #2.  Constipation.  Constipation appears to be due to her poor eating habits.   Plan:  Patient advised to eat 3 meals a day.  She needs to be eating fiber rich foods.  She also needs to drink more fluids/water. She can use Dulcolax  suppository every third day unless she has spontaneous bowel movement. Check serum TSH and calcium. She will keep stool diary for the next 8 weeks. Weight check in 8 weeks. Patient advised to talk with Dr. Sherwood Gambler about patient's short stature issue. Office visit in 6 months.

## 2018-03-11 NOTE — Patient Instructions (Addendum)
Increase intake of fiber rich foods as discussed. Try to eat at least two meals a day if not three. Use Dulcolax suppository if you go 3 days without a bowel movement. Stool diary for 8 weeks.

## 2018-03-12 LAB — CALCIUM: Calcium: 9.9 mg/dL (ref 8.6–10.2)

## 2018-03-12 LAB — TSH: TSH: 1.87 mIU/L

## 2018-04-13 IMAGING — CT CT ABD-PELV W/ CM
2 of 4 series · 14 of 46 positions shown, 16 images · IV contrast (Isovue)
Comparison: No prior CT. Acute abdomen series 01/04/2014. Upper GI
series 05/12/2004.

CLINICAL DATA: 21-year-old presenting with an area of palpable
concern in the infraumbilical anterior abdominal wall initially
noted approximately 1 week ago.

EXAM:
CT ABDOMEN AND PELVIS WITH CONTRAST
TECHNIQUE: Multidetector CT imaging of the abdomen and pelvis was performed
using the standard protocol following bolus administration of
intravenous contrast. The area of palpable concern was marked with
metallic BBs.
CONTRAST:  100mL [id number] IOPAMIDOL INJECTION 61% IV. Oral
contrast was also administered.

[Series 2: axial st · axial · 0.57mm/px · z∈[+1030,+1350]mm · 11 of 78 slices shown, 13 images]
[im 7/78  soft-tissue]
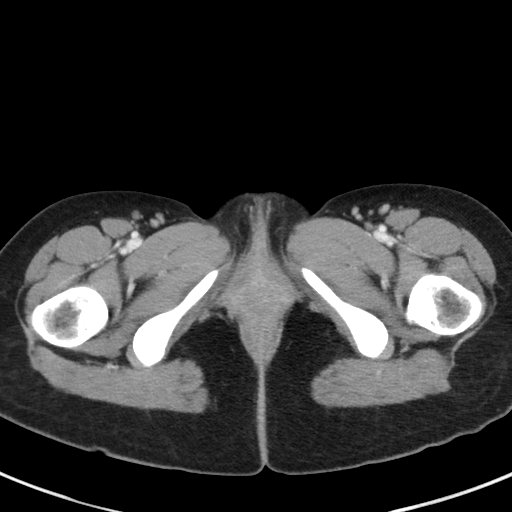
[im 7/78  bone]
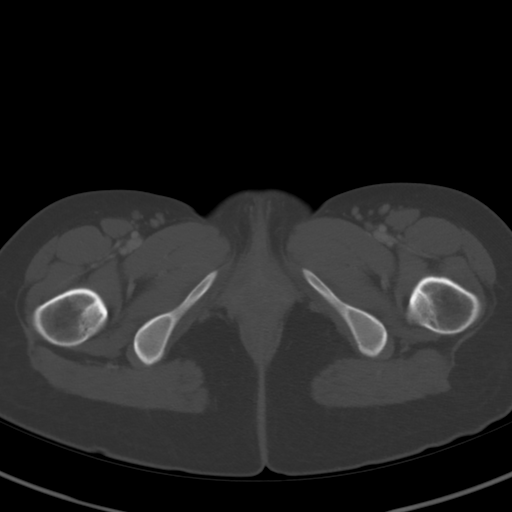
[im 13/78  soft-tissue]
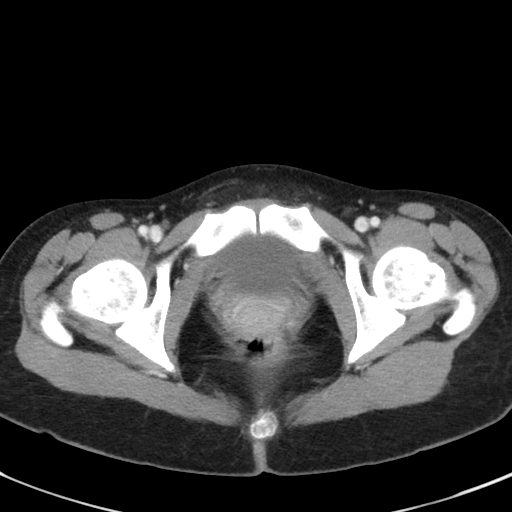
[im 19/78  soft-tissue]
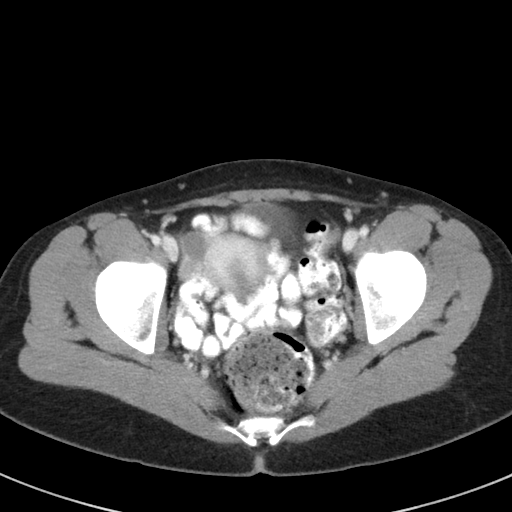
[im 25/78  soft-tissue]
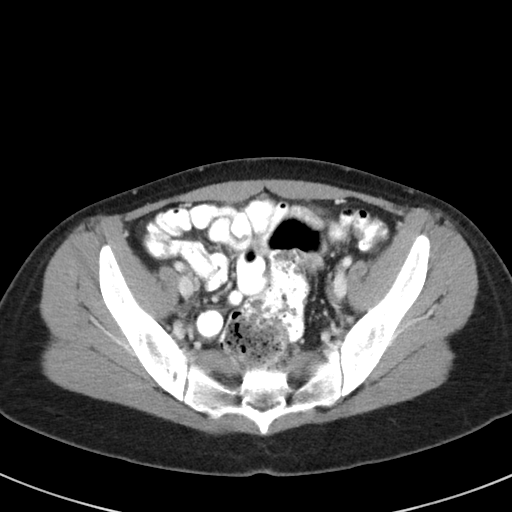
[im 31/78  soft-tissue]
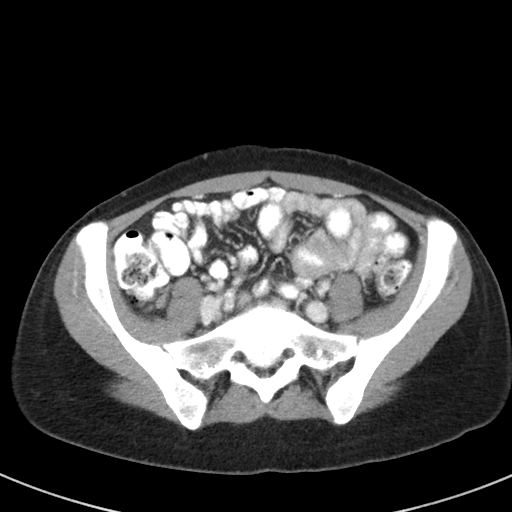
[im 41/78  soft-tissue]
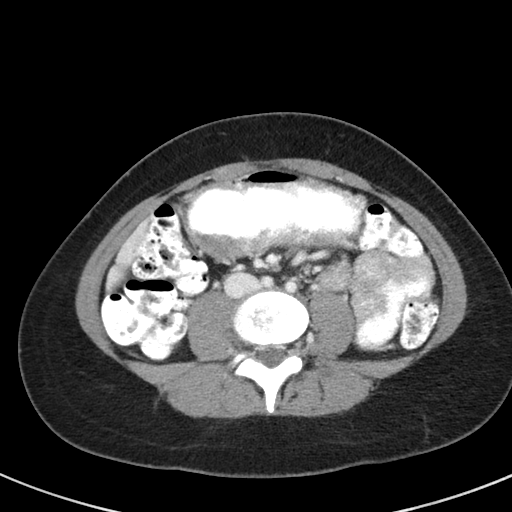
[im 47/78  soft-tissue]
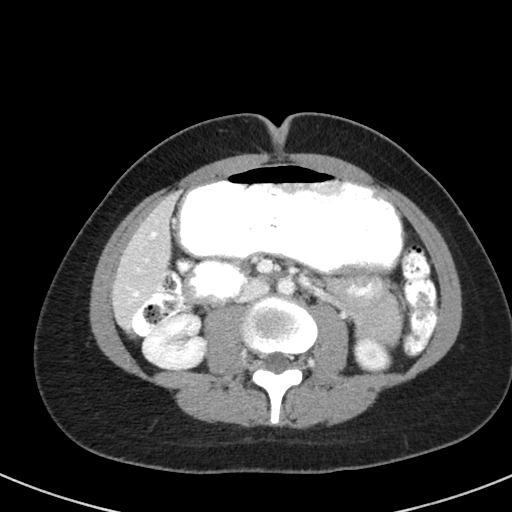
[im 53/78  soft-tissue]
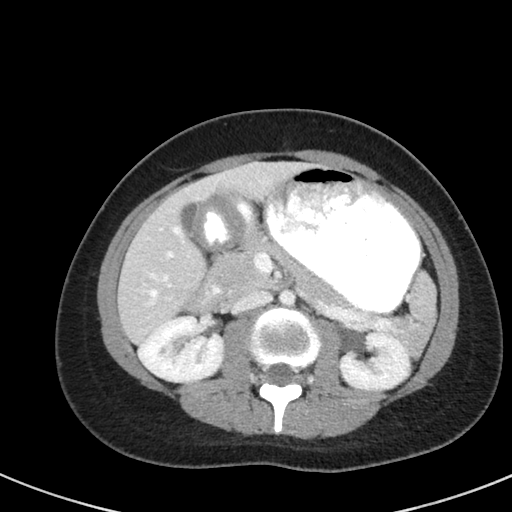
[im 59/78  soft-tissue]
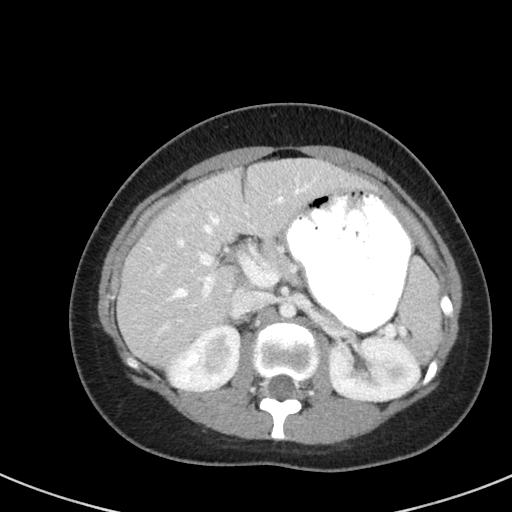
[im 59/78  bone]
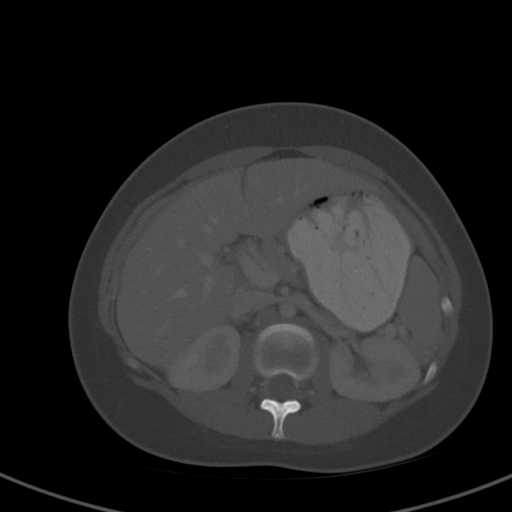
[im 65/78  soft-tissue]
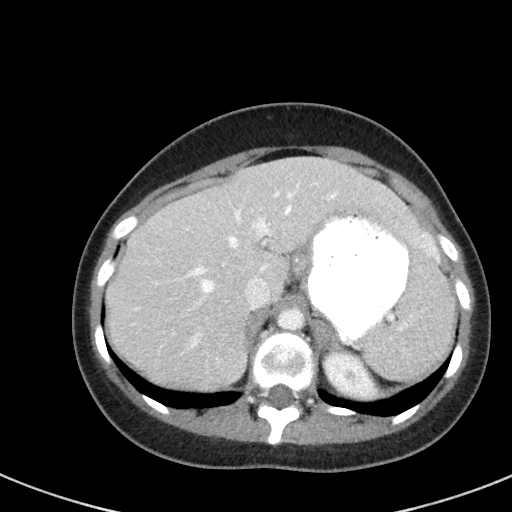
[im 71/78  soft-tissue]
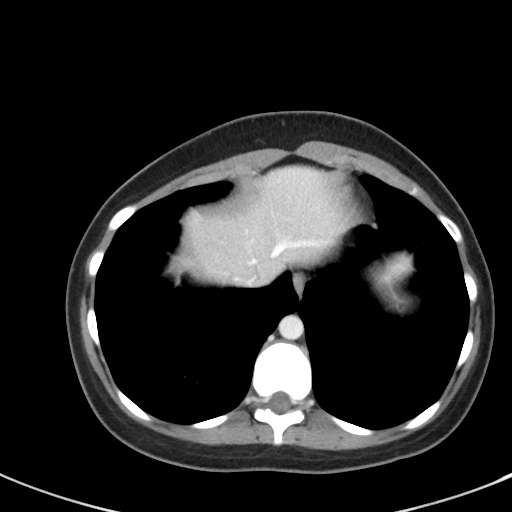

[Series 5: coronal st · coronal · 0.53mm/px · 3 of 68 slices shown]
[im 23/68  soft-tissue]
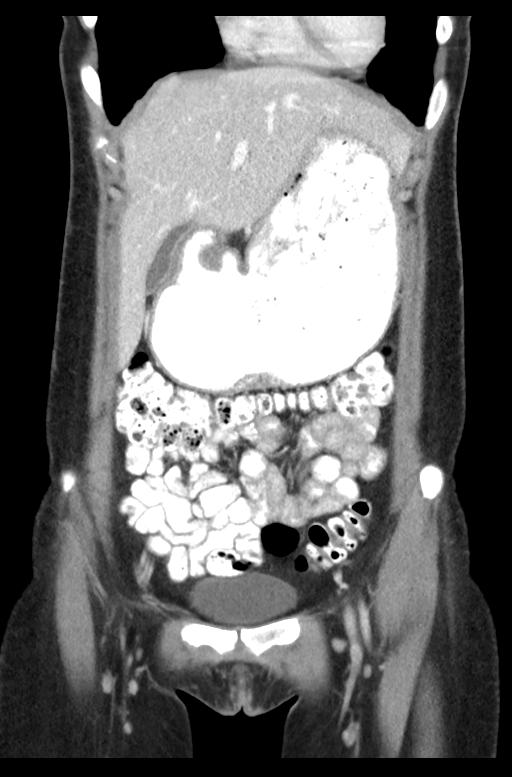
[im 30/68  soft-tissue]
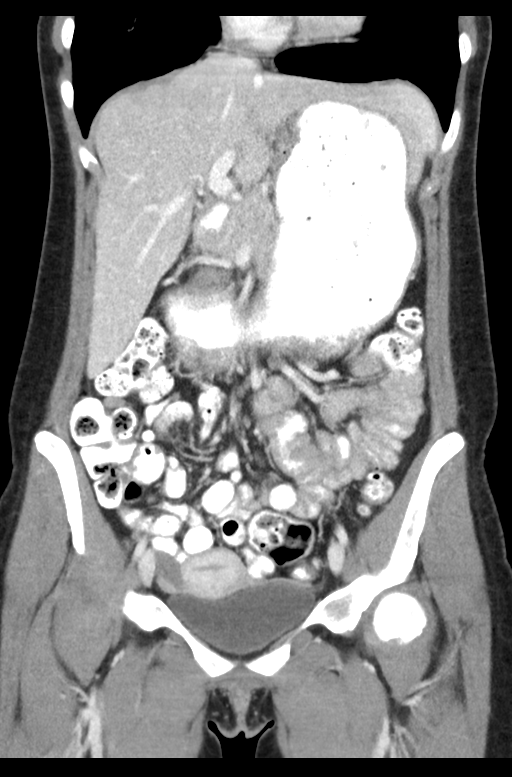
[im 38/68  soft-tissue]
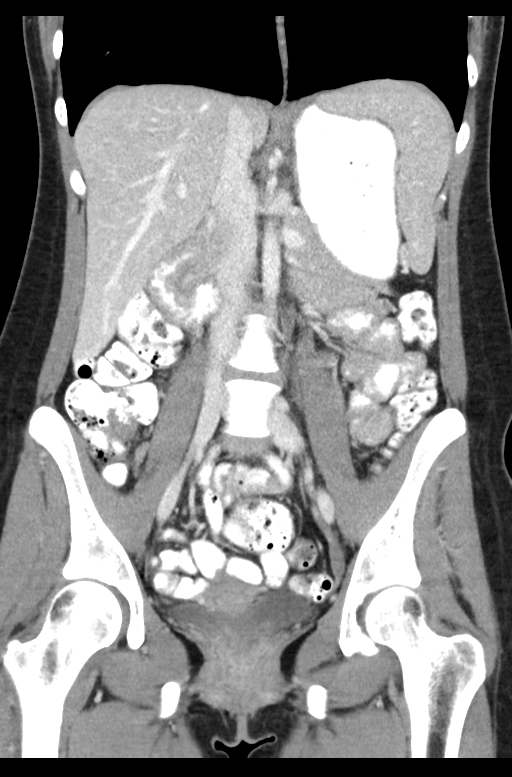

[14 of 46 positions shown; findings below may reference images not displayed]

FINDINGS: Lower chest: Heart size normal.  Visualized lung bases clear.

Hepatobiliary: Liver normal in size and appearance. Gallbladder
normal in appearance without calcified gallstones. No biliary ductal
dilation. Normal anatomic variants of the liver include a Riedel's
lobe and extension of the left lobe across the midline into the left
upper quadrant.

Pancreas: Normal in appearance without evidence of mass, ductal
dilation, or inflammation.

Spleen: Normal in size and appearance.

Adrenals/Urinary Tract: Normal appearing adrenal glands. Kidneys
normal in size and appearance without focal parenchymal abnormality.
No evidence of urinary tract calculi or obstruction.
Normal-appearing urinary bladder.

Stomach/Bowel: Stomach distended with oral contrast. Thickening of
the wall of the gastric antrum, duodenal bulb and descending
duodenum. Compression of the transverse duodenum between the aorta
and the superior mesenteric artery. Remainder of the small bowel
normal in appearance. Moderate colonic stool burden, with a large
rectal stool burden. Normal appearing colon. Nondistended short
appendix in the right upper pelvis which does not fill with oral
contrast.

Vascular/Lymphatic: No visible aortoiliofemoral atherosclerosis.
Widely patent visceral arteries. Normal-appearing portal venous and
systemic venous systems.

No pathologic lymphadenopathy.

Reproductive: Normal appearing uterus. Approximate 2.3 cm cyst
arising from the right ovary. No left ovarian cysts. No free fluid
in the pelvic cul-de-sac.

Other: No mass in the subcutaneous tissues of the infraumbilical
anterior abdominal wall in the area of palpable concern. The distal
portion of the stomach underlies the area of palpable concern.

Musculoskeletal: Schmorl's node involving the lower endplate of L5.
No acute osseous abnormalities.
IMPRESSION: 1. No evidence of mass in the subcutaneous tissues of the
infraumbilical region in the area of palpable concern. The distal
stomach underlies this location.
2. Wall thickening involving the gastric antrum, duodenal bulb and
descending duodenum. As there is suggestion of "nutcracker"
phenomenon (compression of the transverse duodenum between the aorta
and the SMA), this may represent muscular hypertrophy due to mild
chronic outlet obstruction. If the patient currently has abdominal
pain, gastritis and duodenitis can have a similar appearance, though
this is felt less likely.
3. No acute or significant abnormalities otherwise.

## 2018-04-15 ENCOUNTER — Telehealth: Payer: Self-pay | Admitting: Adult Health

## 2018-04-15 NOTE — Telephone Encounter (Signed)
Wants to talk to Mary Singleton about changing her birth control causing her to have cramping and her cycles are not right she said

## 2018-04-15 NOTE — Telephone Encounter (Signed)
Spoke with pt. Pt is on Junel. She is having cramps and is just bleeding one day and usually bleeds 3 days. Has been on this birth control for a while. Pt wants something different. Does she need an appt for this? Thanks!! JSY

## 2018-04-16 NOTE — Telephone Encounter (Signed)
Left message to call and schedule an appt. JSY 

## 2018-04-18 ENCOUNTER — Encounter: Payer: Self-pay | Admitting: Adult Health

## 2018-04-18 ENCOUNTER — Ambulatory Visit: Payer: BLUE CROSS/BLUE SHIELD | Admitting: Adult Health

## 2018-04-18 ENCOUNTER — Other Ambulatory Visit: Payer: Self-pay

## 2018-04-18 VITALS — BP 97/63 | HR 95 | Ht 59.0 in | Wt 93.0 lb

## 2018-04-18 DIAGNOSIS — N946 Dysmenorrhea, unspecified: Secondary | ICD-10-CM

## 2018-04-18 DIAGNOSIS — Z3041 Encounter for surveillance of contraceptive pills: Secondary | ICD-10-CM | POA: Diagnosis not present

## 2018-04-18 MED ORDER — NORGESTIMATE-ETH ESTRADIOL 0.25-35 MG-MCG PO TABS: 1.0000 | ORAL_TABLET | Freq: Every day | ORAL | 11 refills | Status: DC

## 2018-04-18 NOTE — Progress Notes (Signed)
Patient ID: [name], female   DOB: Dec 29, 1995, 23 y.o.   MRN: [id number] History of Present Illness: Mary Singleton is a 58 year old white female, single,G0P0 in with boyfriend complaining of period cramps and periods spotty. PCP is Dr Sherwood Gambler.    Current Medications, Allergies, Past Medical History, Past Surgical History, Family History and Social History were reviewed in [employer] electronic medical record.     Review of Systems: Having period cramps and sometimes when take the pill Periods spotty   Physical Exam:BP 97/63 (BP Location: Right Arm, Patient Position: Sitting, Cuff Size: Normal)   Pulse 95   Ht 4\' 11"  (1.499 m)   Wt 93 lb (42.2 kg)   LMP 04/08/2018   BMI 18.78 kg/m  General:  Well developed, well nourished, no acute distress Skin:  Warm and dry Lungs; Clear to auscultation bilaterally Cardiovascular: Regular rate and rhythm Psych:  No mood changes, alert and cooperative,seems happy Fall risk is low. Finish current pack of OCs, then start new pack, and use condoms for 1 pack.Eat before taking the pill.   Impression:  1. Menstrual cramps   2. Encounter for surveillance of contraceptive pills      Plan: Meds ordered this encounter  Medications  . norgestimate-ethinyl estradiol (ORTHO-CYCLEN,SPRINTEC,PREVIFEM) 0.25-35 MG-MCG tablet    Sig: Take 1 tablet by mouth daily.    Dispense:  1 Package    Refill:  11    Order Specific Question:   Supervising Provider    Answer:   Duane Lope H [[id number]]  F/U in 3 months

## 2018-06-08 ENCOUNTER — Other Ambulatory Visit: Payer: Self-pay | Admitting: Adult Health

## 2018-07-17 ENCOUNTER — Encounter: Payer: Self-pay | Admitting: Adult Health

## 2018-07-17 ENCOUNTER — Other Ambulatory Visit: Payer: Self-pay

## 2018-07-17 ENCOUNTER — Ambulatory Visit: Payer: BLUE CROSS/BLUE SHIELD | Admitting: Adult Health

## 2018-07-17 VITALS — BP 106/72 | HR 88 | Temp 98.2°F | Ht 59.0 in | Wt 95.2 lb

## 2018-07-17 DIAGNOSIS — Z3041 Encounter for surveillance of contraceptive pills: Secondary | ICD-10-CM | POA: Diagnosis not present

## 2018-07-17 DIAGNOSIS — R42 Dizziness and giddiness: Secondary | ICD-10-CM | POA: Insufficient documentation

## 2018-07-17 MED ORDER — MECLIZINE HCL 25 MG PO TABS: 25.0000 mg | ORAL_TABLET | Freq: Two times a day (BID) | ORAL | 1 refills | Status: DC | PRN

## 2018-07-17 NOTE — Patient Instructions (Signed)
Call me about dizzy spells and if antivert helps Near-Syncope Near-syncope is when you suddenly become weak or dizzy, or you feel like you might pass out (faint). During an episode of near-syncope, you may:  Feel dizzy or light-headed.  Feel nauseous.  See all white or all black in your field of vision.  Have cold, clammy skin. This condition is caused by a sudden decrease in blood flow to the brain. This decrease can result from various causes, but most of those causes are not dangerous. However, near-syncope can be a sign of a serious medical problem, so it is important to seek medical care. If you fainted, get medical help right away.Call your local emergency services (911 in the [place] Do not drive yourself to the hospital. Follow these instructions at home: Pay attention to any changes in your symptoms. Take these actions to help with your condition:  Have someone stay with you until you feel stable.  Do not drive, use machinery, or play sports until your health care provider says it is okay.  Keep all follow-up visits as told by your health care provider. This is important.  If you start to feel like you might faint, lie down right away and raise (elevate) your feet above the level of your heart. Breathe deeply and steadily. Wait until all of the symptoms have passed.  Drink enough fluid to keep your urine clear or pale yellow.  If you are taking blood pressure or heart medicine, get up slowly and take several minutes to sit and then stand. This can reduce dizziness.  Take over-the-counter and prescription medicines only as told by your health care provider. Get help right away if:  You have a severe headache.  You have unusual pain in your chest, abdomen, or back.  You are bleeding from your mouth or rectum, or you have black or tarry stool.  You have a very fast or irregular heartbeat (palpitations).  You faint once or repeatedly.  You have a seizure.  You are  confused.  You have trouble walking.  You have severe weakness.  You have vision problems. These symptoms may represent a serious problem that is an emergency. Do not wait to see if your symptoms will go away. Get medical help right away. Call your local emergency services (911 in the [place] Do not drive yourself to the hospital. This information is not intended to replace advice given to you by your health care provider. Make sure you discuss any questions you have with your health care provider. Document Released: 02/19/2005 Document Revised: 04/03/2016 Document Reviewed: 11/03/2014 Elsevier Interactive Patient Education  2019 [employer].

## 2018-07-17 NOTE — Progress Notes (Signed)
Patient ID: Mary Singleton, female   DOB: 1995/12/06, 23 y.o.   MRN: [id number] History of Present Illness: Mary Singleton is a 23 year old white female, single, G0P0,back in follow up on starting sprintec in February, was having spotting and cramps, and that is much better.She is complaining of being dizzy at times. PCP is Dr Sherwood Gambler.    Current Medications, Allergies, Past Medical History, Past Surgical History, Family History and Social History were reviewed in [employer] electronic medical record.     Review of Systems: Periods are good, no  Spotting Cramps much better Dizzy at times Denies headaches or weakness     Physical Exam:BP 106/72 (BP Location: Right Arm, Patient Position: Sitting, Cuff Size: Normal)   Pulse 88   Temp 98.2 F (36.8 C)   Ht 4\' 11"  (1.499 m)   Wt 95 lb 3.2 oz (43.2 kg)   LMP 07/04/2018   BMI 19.23 kg/m  General:  Well developed, well nourished, no acute distress Skin:  Warm and dry Lungs; Clear to auscultation bilaterally Cardiovascular: Regular rate and rhythm Psych:  No mood changes, alert and cooperative,seems happy Fall risk is low.   Impression and Plan:  1. Encounter for surveillance of contraceptive pills -continue sprintec, has refills -use condoms  2. Dizzy spells Will try antivert to see if helps Eat often and make sure getting plenty of fluids Review handout on Near-syncope Return in about 8 months for first pap and physical or sooner if needed Call me in follow up on trying antivert

## 2018-09-09 ENCOUNTER — Ambulatory Visit (INDEPENDENT_AMBULATORY_CARE_PROVIDER_SITE_OTHER): Payer: BLUE CROSS/BLUE SHIELD | Admitting: Internal Medicine

## 2018-12-19 ENCOUNTER — Ambulatory Visit
Admission: EM | Admit: 2018-12-19 | Discharge: 2018-12-19 | Disposition: A | Discharge status: Home / Self Care | Payer: BC Managed Care – PPO | Attending: Emergency Medicine | Admitting: Emergency Medicine

## 2018-12-19 ENCOUNTER — Other Ambulatory Visit: Payer: Self-pay

## 2018-12-19 DIAGNOSIS — Z3202 Encounter for pregnancy test, result negative: Secondary | ICD-10-CM | POA: Diagnosis not present

## 2018-12-19 LAB — POCT URINE PREGNANCY: Preg Test, Ur: NEGATIVE

## 2018-12-19 NOTE — ED Triage Notes (Signed)
Pt presents to [facility] requesting pregnancy test, denies late period

## 2018-12-19 NOTE — ED Provider Notes (Addendum)
[place]   [id number] 12/19/18 Arrival Time: 5885   CC: Pregnancy test  SUBJECTIVE:  Mary Singleton is a 23 y.o. female who presents for pregnancy test.  LMP was 11/22/18, states it was light brown and lasted 1 day.  Currently on BC, but does not take consistently.  Last unprotected sex 3 months ago with boyfriend who passed away 3 months ago.  NOT actively trying to get pregnant. She complains of weight gain, breast fullness, and possible mood swings.  She denies fever, chills, nausea, vomiting, fatigue, abdominal or pelvic pain, dysuria, increased urinary frequency or urgency, vaginal bleeding.    Patient's last menstrual period was 11/26/2018 (approximate).  ROS: As per HPI.  All other pertinent ROS negative.     History reviewed. No pertinent past medical history. Past Surgical History:  Procedure Laterality Date  . BIOPSY  08/16/2017   Procedure: BIOPSY;  Surgeon: Rogene Houston, MD;  Location: [facility];  Service: Endoscopy;;  duodenum  . ESOPHAGOGASTRODUODENOSCOPY (EGD) WITH PROPOFOL N/A 08/16/2017   Procedure: ESOPHAGOGASTRODUODENOSCOPY (EGD) WITH PROPOFOL;  Surgeon: Rogene Houston, MD;  Location: [facility];  Service: Endoscopy;  Laterality: N/A;  8:30  . EYE SURGERY     No Known Allergies No current facility-administered medications on file prior to encounter.    Current Outpatient Medications on File Prior to Encounter  Medication Sig Dispense Refill  . bisacodyl (DULCOLAX) 10 MG suppository Place 1 suppository (10 mg total) rectally as needed for moderate constipation. 12 suppository 0  . clonazePAM (KLONOPIN) 0.5 MG tablet     . meclizine (ANTIVERT) 25 MG tablet Take 1 tablet (25 mg total) by mouth 2 (two) times daily as needed for dizziness. 30 tablet 1  . SPRINTEC 28 0.25-35 MG-MCG tablet TAKE 1 TABLET BY MOUTH EVERY DAY 84 tablet 4   Social History   Socioeconomic History  . Marital status: Single    Spouse name: Not on file  . Number  of children: 0  . Years of education: Not on file  . Highest education level: Not on file  Occupational History  . Not on file  Social Needs  . Financial resource strain: Not on file  . Food insecurity    Worry: Not on file    Inability: Not on file  . Transportation needs    Medical: Not on file    Non-medical: Not on file  Tobacco Use  . Smoking status: Never Smoker  . Smokeless tobacco: Never Used  Substance and Sexual Activity  . Alcohol use: No  . Drug use: No  . Sexual activity: Yes    Birth control/protection: Pill  Lifestyle  . Physical activity    Days per week: Not on file    Minutes per session: Not on file  . Stress: Not on file  Relationships  . Social Herbalist on phone: Not on file    Gets together: Not on file    Attends religious service: Not on file    Active member of club or organization: Not on file    Attends meetings of clubs or organizations: Not on file    Relationship status: Not on file  . Intimate partner violence    Fear of current or ex partner: Not on file    Emotionally abused: Not on file    Physically abused: Not on file    Forced sexual activity: Not on file  Other Topics Concern  . Not on file  Social History Narrative  . Not on file   Family History  Problem Relation Age of Onset  . Cancer Maternal Grandmother     OBJECTIVE:  Vitals:   12/19/18 1659  BP: 116/72  Pulse: 98  Resp: 20  Temp: (!) 97.5 F (36.4 C)  SpO2: 100%     General appearance: alert, NAD, appears stated age Head: NCAT Eyes: EOMI grossly Lungs: CTA bilaterally without adventitious breath sounds Heart: regular rate and rhythm Abdomen: soft, non-tender; bowel sounds normal; no guarding Skin: warm and dry Psychological:  Alert and cooperative. Normal mood and affect.  LABS:  Results for orders placed or performed during the hospital encounter of 12/19/18  POCT urine pregnancy  Result Value Ref Range   Preg Test, Ur Negative  Negative    Labs Reviewed  POCT URINE PREGNANCY    ASSESSMENT & PLAN:  1. Urine pregnancy test negative    Urine pregnancy was negative Rest and push fluids Follow up with PCP as needed Return or go to the ED if you have any of the following: fever, chills, persistent vomiting, abdominal pain, vaginal bleeding, vaginal pain, vaginal discharge or odor, etc...    Reviewed expectations re: course of current medical issues. Questions answered. Outlined signs and symptoms indicating need for more acute intervention. Patient verbalized understanding. After Visit Summary given.       Rennis Harding, PA-C 12/19/18 1724    Alvino Chapel [place], PA-C 02/04/20 1813

## 2018-12-19 NOTE — Discharge Instructions (Addendum)
Urine pregnancy was negative Rest and push fluids Follow up with PCP as needed Return or go to the ED if you have any of the following: fever, chills, persistent vomiting, abdominal pain, vaginal bleeding, vaginal pain, vaginal discharge or odor, etc..Marland Kitchen

## 2019-04-06 ENCOUNTER — Other Ambulatory Visit: Payer: Self-pay

## 2019-04-06 ENCOUNTER — Ambulatory Visit
Admission: EM | Admit: 2019-04-06 | Discharge: 2019-04-06 | Disposition: A | Discharge status: Home / Self Care | Payer: BC Managed Care – PPO | Attending: Emergency Medicine | Admitting: Emergency Medicine

## 2019-04-06 DIAGNOSIS — Z20822 Contact with and (suspected) exposure to covid-19: Secondary | ICD-10-CM | POA: Diagnosis not present

## 2019-04-06 NOTE — ED Provider Notes (Signed)
[place]   [id number] 04/06/19 Arrival Time: 2355   CC: COVID symptoms; exposure  SUBJECTIVE: History from: patient.  Mary Singleton is a 24 y.o. female who presents with headache x 1 day.  Father tested positive for COVID.  Denies recent travel.  Denies aggravating or alleviating factors.  Denies previous COVID infection in the past.   Denies fever, chills, fatigue, sinus pain, rhinorrhea, sore throat, cough, SOB, wheezing, chest pain, nausea, vomiting, changes in bowel or bladder habits.    ROS: As per HPI.  All other pertinent ROS negative.     History reviewed. No pertinent past medical history. Past Surgical History:  Procedure Laterality Date  . BIOPSY  08/16/2017   Procedure: BIOPSY;  Surgeon: Rogene Houston, MD;  [facility]: [facility];  Service: Endoscopy;;  duodenum  . ESOPHAGOGASTRODUODENOSCOPY (EGD) WITH PROPOFOL N/A 08/16/2017   Procedure: ESOPHAGOGASTRODUODENOSCOPY (EGD) WITH PROPOFOL;  Surgeon: Rogene Houston, MD;  Location: [facility];  Service: Endoscopy;  Laterality: N/A;  8:30  . EYE SURGERY     No Known Allergies No current facility-administered medications on file prior to encounter.   Current Outpatient Medications on File Prior to Encounter  Medication Sig Dispense Refill  . SPRINTEC 28 0.25-35 MG-MCG tablet TAKE 1 TABLET BY MOUTH EVERY DAY 84 tablet 4  . [DISCONTINUED] clonazePAM (KLONOPIN) 0.5 MG tablet      Social History   Socioeconomic History  . Marital status: Single    Spouse name: Not on file  . Number of children: 0  . Years of education: Not on file  . Highest education level: Not on file  Occupational History  . Not on file  Tobacco Use  . Smoking status: Never Smoker  . Smokeless tobacco: Never Used  Substance and Sexual Activity  . Alcohol use: No  . Drug use: No  . Sexual activity: Yes    Birth control/protection: Pill  Other Topics Concern  . Not on file  Social History Narrative  . Not on file   Social  Determinants of Health   Financial Resource Strain:   . Difficulty of Paying Living Expenses: Not on file  Food Insecurity:   . Worried About Charity fundraiser in the Last Year: Not on file  . Ran Out of Food in the Last Year: Not on file  Transportation Needs:   . Lack of Transportation (Medical): Not on file  . Lack of Transportation (Non-Medical): Not on file  Physical Activity:   . Days of Exercise per Week: Not on file  . Minutes of Exercise per Session: Not on file  Stress:   . Feeling of Stress : Not on file  Social Connections:   . Frequency of Communication with Friends and Family: Not on file  . Frequency of Social Gatherings with Friends and Family: Not on file  . Attends Religious Services: Not on file  . Active Member of Clubs or Organizations: Not on file  . Attends Archivist Meetings: Not on file  . Marital Status: Not on file  Intimate Partner Violence:   . Fear of Current or Ex-Partner: Not on file  . Emotionally Abused: Not on file  . Physically Abused: Not on file  . Sexually Abused: Not on file   Family History  Problem Relation Age of Onset  . Cancer Maternal Grandmother   . Healthy Mother     OBJECTIVE:  Vitals:   04/06/19 1316  BP: 104/67  Pulse: 98  Resp: 16  Temp: 98.5 F (36.9 C)  TempSrc: Oral  SpO2: 98%     General appearance: alert; well appearing, nontoxic; speaking in full sentences and tolerating own secretions HEENT: NCAT; Ears: EACs clear, TMs pearly gray; Eyes: PERRL.  EOM grossly intact. Nose: nares patent without rhinorrhea, Throat: oropharynx clear, tonsils non erythematous or enlarged, uvula midline  Neck: supple without LAD Lungs: unlabored respirations, symmetrical air entry; cough: absent; no respiratory distress; CTAB Heart: regular rate and rhythm.  Skin: warm and dry Psychological: alert and cooperative; normal mood and affect  ASSESSMENT & PLAN:  1. Exposure to COVID-19 virus    COVID testing  ordered.  It will take between 5-7 days for test results.  Someone will contact you regarding abnormal results.    In the meantime: You should remain isolated in your home for 10 days from symptom onset AND greater than 72 hours after symptoms resolution (absence of fever without the use of fever-reducing medication and improvement in respiratory symptoms), whichever is longer OR 14 days from exposure Get plenty of rest and push fluids Use OTC zyrtec for nasal congestion, runny nose, and/or sore throat Use OTC flonase for nasal congestion and runny nose Use medications daily for symptom relief Use OTC medications like ibuprofen or tylenol as needed fever or pain Call or go to the ED if you have any new or worsening symptoms such as fever, cough, shortness of breath, chest tightness, chest pain, turning blue, changes in mental status, etc...   Reviewed expectations re: course of current medical issues. Questions answered. Outlined signs and symptoms indicating need for more acute intervention. Patient verbalized understanding. After Visit Summary given.         Rennis Harding, PA-C 04/06/19 1402

## 2019-04-06 NOTE — ED Triage Notes (Signed)
Pt presents to UC for covid test. States father has covid and she has been exposed to him. States she has slight headache which started this morning.

## 2019-04-06 NOTE — Discharge Instructions (Signed)

## 2019-04-07 LAB — NOVEL CORONAVIRUS, NAA: SARS-CoV-2, NAA: DETECTED — AB

## 2019-04-09 ENCOUNTER — Encounter (HOSPITAL_COMMUNITY): Payer: Self-pay

## 2019-04-09 ENCOUNTER — Telehealth (HOSPITAL_COMMUNITY): Payer: Self-pay | Admitting: Emergency Medicine

## 2019-04-09 NOTE — Telephone Encounter (Signed)

## 2019-08-08 ENCOUNTER — Other Ambulatory Visit: Payer: Self-pay | Admitting: Adult Health

## 2020-02-04 ENCOUNTER — Other Ambulatory Visit: Payer: Self-pay

## 2020-02-04 ENCOUNTER — Ambulatory Visit
Admission: RE | Admit: 2020-02-04 | Discharge: 2020-02-04 | Disposition: A | Discharge status: Home / Self Care | Payer: BC Managed Care – PPO | Source: Ambulatory Visit | Attending: Emergency Medicine | Admitting: Emergency Medicine

## 2020-02-04 VITALS — BP 107/67 | HR 78 | Temp 98.3°F | Resp 18

## 2020-02-04 DIAGNOSIS — J039 Acute tonsillitis, unspecified: Secondary | ICD-10-CM

## 2020-02-04 DIAGNOSIS — J358 Other chronic diseases of tonsils and adenoids: Secondary | ICD-10-CM | POA: Diagnosis not present

## 2020-02-04 MED ORDER — MELOXICAM 7.5 MG PO TABS: 7.5000 mg | ORAL_TABLET | Freq: Every day | ORAL | 0 refills | Status: DC

## 2020-02-04 NOTE — Discharge Instructions (Signed)
Tonsil stone present Mobic for pain Perform salt water/ mouth wash gargles this may help to dislodge the stone Drink warm or cool liquids, use throat lozenges, or popsicles to help alleviate symptoms Follow up with PCP if symptoms persists Return or go to ER if patient has any new or worsening symptoms such as fever, chills, nausea, vomiting, worsening sore throat, cough, abdominal pain, chest pain, changes in bowel or bladder habits, etc..Marland Kitchen

## 2020-02-04 NOTE — ED Triage Notes (Signed)
Pt presents with c/o sore throat , no other symptoms

## 2020-02-04 NOTE — ED Provider Notes (Signed)
[facility]   [id number] 02/04/20 Arrival Time: 1711  BH:ALPF THROAT  SUBJECTIVE: History from: patient.  Mary Singleton is a 24 y.o. female who presents with sore scratchy throat x 2-3 days.  Denies sick exposure to strep, flu or mono, or precipitating event.  Denies alleviating factors.  Symptoms are made worse with swallowing, but tolerating liquids and own secretions without difficulty. Also reports nasal congestion. Denies previous symptoms in the past.  Denies fever, chills, fatigue, ear pain, sinus pain, rhinorrhea, nasal congestion, cough, SOB, wheezing, chest pain, nausea, rash, changes in bowel or bladder habits.     ROS: As per HPI.  All other pertinent ROS negative.     History reviewed. No pertinent past medical history. Past Surgical History:  Procedure Laterality Date  . BIOPSY  08/16/2017   Procedure: BIOPSY;  Surgeon: Malissa Hippo, MD;  [facility];  Service: Endoscopy;;  duodenum  . ESOPHAGOGASTRODUODENOSCOPY (EGD) WITH PROPOFOL N/A 08/16/2017   Procedure: ESOPHAGOGASTRODUODENOSCOPY (EGD) WITH PROPOFOL;  Surgeon: Malissa Hippo, MD;  [facility];  Service: Endoscopy;  Laterality: N/A;  8:30  . EYE SURGERY     No Known Allergies No current facility-administered medications on file prior to encounter.   Current Outpatient Medications on File Prior to Encounter  Medication Sig Dispense Refill  . SPRINTEC 28 0.25-35 MG-MCG tablet TAKE 1 TABLET BY MOUTH EVERY DAY 84 tablet 4  . [DISCONTINUED] clonazePAM (KLONOPIN) 0.5 MG tablet      Social History   Socioeconomic History  . Marital status: Single    Spouse name: Not on file  . Number of children: 0  . Years of education: Not on file  . Highest education level: Not on file  Occupational History  . Not on file  Tobacco Use  . Smoking status: Never Smoker  . Smokeless tobacco: Never Used  Vaping Use  . Vaping Use: Never used  Substance and Sexual Activity  . Alcohol  use: No  . Drug use: No  . Sexual activity: Yes    Birth control/protection: Pill  Other Topics Concern  . Not on file  Social History Narrative  . Not on file   Social Determinants of Health   Financial Resource Strain:   . Difficulty of Paying Living Expenses: Not on file  Food Insecurity:   . Worried About Programme researcher, broadcasting/film/video in the Last Year: Not on file  . Ran Out of Food in the Last Year: Not on file  Transportation Needs:   . Lack of Transportation (Medical): Not on file  . Lack of Transportation (Non-Medical): Not on file  Physical Activity:   . Days of Exercise per Week: Not on file  . Minutes of Exercise per Session: Not on file  Stress:   . Feeling of Stress : Not on file  Social Connections:   . Frequency of Communication with Friends and Family: Not on file  . Frequency of Social Gatherings with Friends and Family: Not on file  . Attends Religious Services: Not on file  . Active Member of Clubs or Organizations: Not on file  . Attends Banker Meetings: Not on file  . Marital Status: Not on file  Intimate Partner Violence:   . Fear of Current or Ex-Partner: Not on file  . Emotionally Abused: Not on file  . Physically Abused: Not on file  . Sexually Abused: Not on file   Family History  Problem Relation Age of Onset  .  Cancer Maternal Grandmother   . Healthy Mother     OBJECTIVE:  Vitals:   02/04/20 1745  BP: 107/67  Pulse: 78  Resp: 18  Temp: 98.3 F (36.8 C)  SpO2: 98%     General appearance: alert; well-appearing, nontoxic; speaking in full sentences and tolerating own secretions HEENT: NCAT; Ears: EACs clear, TMs pearly gray; Eyes: PERRL.  EOM grossly intact.Nose: nares patent without rhinorrhea, Throat: oropharynx clear, tonsils non erythematous 2-3+, tonsil stone present in RT tonsil, uvula midline  Neck: supple without LAD Lungs: unlabored respirations, symmetrical air entry; cough: absent; no respiratory distress; CTAB Heart:  regular rate and rhythm.  Skin: warm and dry Psychological: alert and cooperative; normal mood and affect   ASSESSMENT & PLAN:  1. Tonsil stone   2. Acute tonsillitis, unspecified etiology     Meds ordered this encounter  Medications  . meloxicam (MOBIC) 7.5 MG tablet    Sig: Take 1 tablet (7.5 mg total) by mouth daily.    Dispense:  30 tablet    Refill:  0    Order Specific Question:   Supervising Provider    Answer:   Eustace Moore [[id number]]   Tonsil stone present Mobic for pain Perform salt water/ mouth wash gargles this may help to dislodge the stone Drink warm or cool liquids, use throat lozenges, or popsicles to help alleviate symptoms Follow up with PCP if symptoms persists Return or go to ER if patient has any new or worsening symptoms such as fever, chills, nausea, vomiting, worsening sore throat, cough, abdominal pain, chest pain, changes in bowel or bladder habits, etc...   Reviewed expectations re: course of current medical issues. Questions answered. Outlined signs and symptoms indicating need for more acute intervention. Patient verbalized understanding. After Visit Summary given.        Rennis Harding, PA-C 02/04/20 1814

## 2020-03-01 ENCOUNTER — Inpatient Hospital Stay (HOSPITAL_COMMUNITY)
Admission: AD | Admit: 2020-03-01 | Discharge: 2020-03-01 | Disposition: A | Discharge status: Home / Self Care | Payer: BC Managed Care – PPO | Attending: Family Medicine | Admitting: Family Medicine

## 2020-03-01 ENCOUNTER — Encounter (HOSPITAL_COMMUNITY): Payer: Self-pay | Admitting: Family Medicine

## 2020-03-01 ENCOUNTER — Other Ambulatory Visit: Payer: Self-pay

## 2020-03-01 DIAGNOSIS — Z3490 Encounter for supervision of normal pregnancy, unspecified, unspecified trimester: Secondary | ICD-10-CM

## 2020-03-01 DIAGNOSIS — Z3201 Encounter for pregnancy test, result positive: Secondary | ICD-10-CM | POA: Diagnosis present

## 2020-03-01 DIAGNOSIS — O3680X0 Pregnancy with inconclusive fetal viability, not applicable or unspecified: Secondary | ICD-10-CM | POA: Diagnosis not present

## 2020-03-01 NOTE — MAU Provider Note (Signed)
Event Date/Time   First Provider Initiated Contact with Patient 03/01/20 1241      S [name] is a 24 y.o. G0P0000 patient who presents to MAU today with complaint of (+) HPT x 2. She is requesting pregnancy confirmation. She denies pain or vaginal bleeding  O BP 107/78 (BP Location: Right Arm)   Pulse 88   Temp 98.1 F (36.7 C) (Oral)   Resp 18   Ht 4\' 11"  (1.499 m)   Wt 38.3 kg   LMP 01/21/2020 Comment: bled for a day about 2 wks ago  SpO2 100%   BMI 17.07 kg/m    Physical Exam Vitals and nursing note reviewed.  Constitutional:      Appearance: Normal appearance. She is normal weight.  HENT:     Head: Normocephalic and atraumatic.  Cardiovascular:     Rate and Rhythm: Normal rate.  Pulmonary:     Effort: Pulmonary effort is normal.  Musculoskeletal:        General: Normal range of motion.     Cervical back: Normal range of motion.  Skin:    General: Skin is warm and dry.  Neurological:     Mental Status: She is alert and oriented to person, place, and time.  Psychiatric:        Attention and Perception: Attention normal.        Mood and Affect: Mood normal.        Speech: Speech normal.        Behavior: Behavior normal.        Thought Content: Thought content normal.        Cognition and Memory: Cognition normal.        Judgment: Judgment normal.    A Medical screening exam complete Pregnancy, unspecified gestational age - Plan: Discharge patient   P Discharge from MAU in stable condition List of [facility] OB providers given  Warning signs for worsening condition that would warrant emergency follow-up discussed Patient may return to MAU as needed   [facility], CNM 03/01/2020 12:43 PM

## 2020-03-01 NOTE — MAU Note (Signed)
thinks she is pregnant,  +HPT x2.  Here for confirmation.   Denies bleeding or pain.

## 2020-03-22 ENCOUNTER — Other Ambulatory Visit: Payer: Self-pay

## 2020-03-22 ENCOUNTER — Ambulatory Visit
Admission: EM | Admit: 2020-03-22 | Discharge: 2020-03-22 | Discharge status: Absent without leave | Payer: BC Managed Care – PPO

## 2020-03-23 ENCOUNTER — Encounter: Payer: Self-pay | Admitting: Women's Health

## 2020-03-23 ENCOUNTER — Ambulatory Visit (INDEPENDENT_AMBULATORY_CARE_PROVIDER_SITE_OTHER): Payer: BC Managed Care – PPO | Admitting: Women's Health

## 2020-03-23 ENCOUNTER — Other Ambulatory Visit: Payer: Self-pay

## 2020-03-23 ENCOUNTER — Other Ambulatory Visit (HOSPITAL_COMMUNITY)
Admission: RE | Admit: 2020-03-23 | Discharge: 2020-03-23 | Disposition: A | Discharge status: Home / Self Care | Payer: BC Managed Care – PPO | Source: Ambulatory Visit | Attending: Obstetrics & Gynecology | Admitting: Obstetrics & Gynecology

## 2020-03-23 ENCOUNTER — Other Ambulatory Visit: Payer: Self-pay | Admitting: Women's Health

## 2020-03-23 VITALS — BP 93/64 | HR 75 | Ht <= 58 in | Wt 84.0 lb

## 2020-03-23 DIAGNOSIS — N9089 Other specified noninflammatory disorders of vulva and perineum: Secondary | ICD-10-CM | POA: Insufficient documentation

## 2020-03-23 DIAGNOSIS — Z113 Encounter for screening for infections with a predominantly sexual mode of transmission: Secondary | ICD-10-CM

## 2020-03-23 MED ORDER — VALACYCLOVIR HCL 1 G PO TABS: 1000.0000 mg | ORAL_TABLET | Freq: Two times a day (BID) | ORAL | 0 refills | Status: DC

## 2020-03-23 NOTE — Progress Notes (Signed)
   GYN VISIT Patient name: Mary Singleton MRN [id number]  Date of birth: [birth year] Chief Complaint:   Gynecologic Exam (Has bumps all over vaginal area, happened after shaving. )  History of Present Illness:   Mary Singleton is a 25 y.o. G0P0000 Caucasian female being seen today for report of painful irritated bumps on vulva after shaving. Tried neosporin & oatmeal bath that didn't help.  Depression screen [facility] 2/9 01/09/2017  Decreased Interest 0  Down, Depressed, Hopeless 0  PHQ - 2 Score 0    Patient's last menstrual period was 03/07/2020. The current method of family planning is OCP (estrogen/progesterone).  Last pap never. Results were:  n/a Review of Systems:   Pertinent items are noted in HPI Denies fever/chills, dizziness, headaches, visual disturbances, fatigue, shortness of breath, chest pain, abdominal pain, vomiting, abnormal vaginal discharge/itching/odor/irritation, problems with periods, bowel movements, urination, or intercourse unless otherwise stated above.  Pertinent History Reviewed:  Reviewed past medical,surgical, social, obstetrical and family history.  Reviewed problem list, medications and allergies. Physical Assessment:   Vitals:   03/23/20 1048  BP: 93/64  Pulse: 75  Weight: 84 lb (38.1 kg)  Height: 4\' 10"  (1.473 m)  Body mass index is 17.56 kg/m.       Physical Examination:   General appearance: alert, well appearing, and in no distress  Mental status: alert, oriented to person, place, and time  Skin: warm & dry   Cardiovascular: normal heart rate noted  Respiratory: normal respiratory effort, no distress  Abdomen: soft, non-tender   Pelvic: lower mons and outside of labia majora with red raised bumps w/ dot in center that appear consistent w/ molluscum contagiosum, then has ulcerated lesions in labia minora and near introitus- HSV culture obtained, blind vaginal CV swab obtained  Extremities: no edema   Chaperone:    No  results found for this or any previous visit (from the past 24 hour(s)).  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 valACYclovir (VALTREX) 1000 MG tablet    Sig: Take 1 tablet (1,000 mg total) by mouth 2 (two) times daily. X 10 days    Dispense:  20 tablet    Refill:  0    Order Specific Question:   Supervising Provider    Answer:   Faith Rogue H [[id number]]    Orders Placed This Encounter  Procedures  . Herpes simplex virus culture    Return in about 4 weeks (around 04/20/2020) for Pap & physical.  04/22/2020 CNM, [facility] 03/23/2020 11:37 AM

## 2020-03-24 LAB — CERVICOVAGINAL ANCILLARY ONLY
Chlamydia: NEGATIVE
Comment: NEGATIVE
Comment: NEGATIVE
Comment: NORMAL
Neisseria Gonorrhea: NEGATIVE
Trichomonas: NEGATIVE

## 2020-03-25 LAB — NOVEL CORONAVIRUS, NAA: SARS-CoV-2, NAA: NOT DETECTED

## 2020-03-25 LAB — SARS-COV-2, NAA 2 DAY TAT

## 2020-03-28 ENCOUNTER — Encounter: Payer: Self-pay | Admitting: *Deleted

## 2020-03-29 ENCOUNTER — Other Ambulatory Visit: Payer: Self-pay | Admitting: Women's Health

## 2020-04-20 ENCOUNTER — Other Ambulatory Visit: Payer: BC Managed Care – PPO | Admitting: Women's Health

## 2020-05-13 ENCOUNTER — Other Ambulatory Visit: Payer: Self-pay

## 2020-05-20 ENCOUNTER — Other Ambulatory Visit: Payer: Self-pay

## 2020-05-20 MED ORDER — VALACYCLOVIR HCL 1 G PO TABS: 1000.0000 mg | ORAL_TABLET | Freq: Two times a day (BID) | ORAL | 0 refills | Status: DC

## 2020-06-08 ENCOUNTER — Emergency Department (HOSPITAL_COMMUNITY)
Admission: EM | Admit: 2020-06-08 | Discharge: 2020-06-08 | Disposition: A | Discharge status: Home / Self Care | Payer: BC Managed Care – PPO | Attending: Emergency Medicine | Admitting: Emergency Medicine

## 2020-06-08 ENCOUNTER — Encounter (HOSPITAL_COMMUNITY): Payer: Self-pay

## 2020-06-08 ENCOUNTER — Other Ambulatory Visit: Payer: Self-pay

## 2020-06-08 DIAGNOSIS — J039 Acute tonsillitis, unspecified: Secondary | ICD-10-CM | POA: Diagnosis not present

## 2020-06-08 DIAGNOSIS — R070 Pain in throat: Secondary | ICD-10-CM | POA: Diagnosis present

## 2020-06-08 LAB — GROUP A STREP BY PCR: Group A Strep by PCR: NOT DETECTED

## 2020-06-08 LAB — MONONUCLEOSIS SCREEN: Mono Screen: NEGATIVE

## 2020-06-08 MED ORDER — LIDOCAINE VISCOUS HCL 2 % MT SOLN
15.0000 mL | Freq: Once | OROMUCOSAL | Status: AC
  Administered 2020-06-08: 15 mL via ORAL
  Filled 2020-06-08: qty 15

## 2020-06-08 MED ORDER — CHLORASEPTIC 1.4 % MT LIQD: 1.0000 | OROMUCOSAL | 0 refills | Status: DC | PRN

## 2020-06-08 MED ORDER — DEXAMETHASONE 4 MG PO TABS
10.0000 mg | ORAL_TABLET | Freq: Once | ORAL | Status: AC
  Administered 2020-06-08: 10 mg via ORAL
  Filled 2020-06-08: qty 3

## 2020-06-08 MED ORDER — ALUM & MAG HYDROXIDE-SIMETH 200-200-20 MG/5ML PO SUSP
30.0000 mL | Freq: Once | ORAL | Status: AC
  Administered 2020-06-08: 30 mL via ORAL
  Filled 2020-06-08: qty 30

## 2020-06-08 NOTE — Discharge Instructions (Signed)
Your strep screen and mono screen were both negative.  Your symptoms are likely due to a viral illness.  We recommend Tylenol for management of.  You may use Chloraseptic spray.  Drink plenty of warm liquids as this may be soothing for your throat.  Follow-up with your primary care doctor if symptoms persist.

## 2020-06-08 NOTE — ED Provider Notes (Signed)
MSE was initiated and I personally evaluated the patient and placed orders (if any) at  3:43 AM on June 08, 2020.  Patient to ED with sore throat x 1 day. Able to swallow. No fever.   Today's Vitals   06/08/20 0328 06/08/20 0331  BP: 123/79   Pulse: (!) 105   Resp: 16   Temp: 97.6 F (36.4 C)   TempSrc: Oral   SpO2: 100%   Weight:  38.1 kg  Height:  4\' 10"  (1.473 m)  PainSc:  0-No pain   Body mass index is 17.55 kg/m.  Tonsillar swelling bilaterally with minimal exudates. +cervical lymph nodes. Mild LUQ tenderness without splenomegaly.  Strep, mono pending  The patient appears stable so that the remainder of the MSE may be completed by another provider.   , PA-C 06/08/20 0345    08/08/20, MD 06/08/20 [phone]

## 2020-06-08 NOTE — ED Provider Notes (Signed)
[facility] EMERGENCY DEPARTMENT Provider Note   CSN: [id number] Arrival date & time: 06/08/20  6644     History Chief Complaint  Patient presents with  . Sore Throat    Mary Singleton is a 25 y.o. female.  The history is provided by the patient. No language interpreter was used.  Sore Throat This is a new problem. The current episode started 6 to 12 hours ago. The problem occurs constantly. The problem has not changed since onset.The symptoms are aggravated by swallowing. Nothing relieves the symptoms. Treatments tried: Aleve. The treatment provided no relief.       History reviewed. No pertinent past medical history.  Patient Active Problem List   Diagnosis Date Noted  . Dizzy spells 07/17/2018  . Gastric outlet obstruction 03/22/2017    Past Surgical History:  Procedure Laterality Date  . BIOPSY  08/16/2017   Procedure: BIOPSY;  Surgeon: Malissa Hippo, MD;  [facility];  Service: Endoscopy;;  duodenum  . ESOPHAGOGASTRODUODENOSCOPY (EGD) WITH PROPOFOL N/A 08/16/2017   Procedure: ESOPHAGOGASTRODUODENOSCOPY (EGD) WITH PROPOFOL;  Surgeon: Malissa Hippo, MD;  [facility];  Service: Endoscopy;  Laterality: N/A;  8:30  . EYE SURGERY       OB History    Gravida  0   Para  0   Term  0   Preterm  0   AB  0   Living  0     SAB  0   IAB  0   Ectopic  0   Multiple  0   Live Births  0           Family History  Problem Relation Age of Onset  . Cancer Maternal Grandmother   . Healthy Mother     Social History   Tobacco Use  . Smoking status: Never Smoker  . Smokeless tobacco: Never Used  Vaping Use  . Vaping Use: Never used  Substance Use Topics  . Alcohol use: No  . Drug use: No    Home Medications Prior to Admission medications   Medication Sig Start Date End Date Taking? Authorizing Provider  phenol (CHLORASEPTIC) 1.4 % LIQD Use as directed 1 spray in the mouth or throat as needed for throat  irritation / pain. 06/08/20  Yes Antony Madura, PA-C  levonorgestrel-ethinyl estradiol (ALESSE) 0.1-20 MG-MCG tablet Take 1 tablet by mouth daily.    [provider]  valACYclovir (VALTREX) 1000 MG tablet Take 1 tablet (1,000 mg total) by mouth 2 (two) times daily. X 10 days 05/20/20   Cheral Marker, CNM  clonazePAM Scarlette Calico) 0.5 MG tablet  04/09/18 04/06/19  [provider]    Allergies    Meloxicam  Review of Systems   Review of Systems  Ten systems reviewed and are negative for acute change, except as noted in the HPI.    Physical Exam Updated Vital Signs BP 123/79 (BP Location: Right Arm)   Pulse (!) 105   Temp 97.6 F (36.4 C) (Oral)   Resp 16   Ht 4\' 10"  (1.473 m)   Wt 38.1 kg   SpO2 100%   BMI 17.55 kg/m   Physical Exam Vitals and nursing note reviewed.  Constitutional:      General: She is not in acute distress.    Appearance: She is well-developed. She is not diaphoretic.     Comments: Nontoxic-appearing and in no distress  HENT:     Head: Normocephalic and atraumatic.  Mouth/Throat:     Comments: Tonsillitis.  Uvula midline.  There are punctate exudates on the tonsils.  No inability to swallow, drooling.  Normal phonation.  Tongue midline. Eyes:     General: No scleral icterus.    Conjunctiva/sclera: Conjunctivae normal.  Neck:     Comments: No nuchal rigidity or meningismus Pulmonary:     Effort: Pulmonary effort is normal. No respiratory distress.     Comments: Respirations even and unlabored Musculoskeletal:        General: Normal range of motion.     Cervical back: Normal range of motion.  Skin:    General: Skin is warm and dry.     Coloration: Skin is not pale.     Findings: No erythema or rash.  Neurological:     Mental Status: She is alert and oriented to person, place, and time.     Coordination: Coordination normal.  Psychiatric:        Behavior: Behavior normal.     ED Results / Procedures / Treatments   Labs (all  labs ordered are listed, but only abnormal results are displayed) Labs Reviewed  GROUP A STREP BY PCR  MONONUCLEOSIS SCREEN    EKG None  Radiology No results found.  Procedures Procedures   Medications Ordered in ED Medications  dexamethasone (DECADRON) tablet 10 mg (has no administration in time range)  alum & mag hydroxide-simeth (MAALOX/MYLANTA) 200-200-20 MG/5ML suspension 30 mL (has no administration in time range)    And  lidocaine (XYLOCAINE) 2 % viscous mouth solution 15 mL (has no administration in time range)    ED Course  I have reviewed the triage vital signs and the nursing notes.  Pertinent labs & imaging results that were available during my care of the patient were reviewed by me and considered in my medical decision making (see chart for details).    MDM Rules/Calculators/A&P                          25 year old female presents to the emergency department for sore throat x1 day.  She has evidence of tonsillitis bilaterally with punctate exudates.  Uvula midline.  Tolerating secretions without difficulty.  She is afebrile with reassuring vital signs.  No shortness of breath, meningismus, stridor.  Phonation normal.  Strep screen and Monospot both negative today.  Her symptoms are likely related to viral etiology.  Discussed supportive care as well as primary care follow-up.  Discharged in stable condition.   Final Clinical Impression(s) / ED Diagnoses Final diagnoses:  Tonsillitis    Rx / DC Orders ED Discharge Orders         Ordered    phenol (CHLORASEPTIC) 1.4 % LIQD  As needed        06/08/20 0533           Antony Madura, PA-C 06/08/20 1610    Tegeler, Canary Brim, MD 06/08/20 [phone]

## 2020-06-08 NOTE — ED Triage Notes (Signed)
Per pt" feels like there is air in my throat" denies any pain, coughing and fever. Able to speak in full sentences and swallow own secretions.

## 2020-08-01 ENCOUNTER — Inpatient Hospital Stay (HOSPITAL_COMMUNITY)
Admission: AD | Admit: 2020-08-01 | Discharge: 2020-08-01 | Disposition: A | Discharge status: Home / Self Care | Payer: BC Managed Care – PPO | Attending: Obstetrics and Gynecology | Admitting: Obstetrics and Gynecology

## 2020-08-01 DIAGNOSIS — Z3202 Encounter for pregnancy test, result negative: Secondary | ICD-10-CM | POA: Diagnosis present

## 2020-08-01 LAB — POCT PREGNANCY, URINE: Preg Test, Ur: NEGATIVE

## 2020-08-01 LAB — HCG, QUANTITATIVE, PREGNANCY: hCG, Beta Chain, Quant, S: <1 m[IU]/mL (ref <5)

## 2020-08-01 NOTE — MAU Note (Signed)
Pt reports she has had 2 positive pregnancy test and then tested negative today. Just wants confirmation. Denies any abd pain or cramping . No vag bleeding or discharge reported.

## 2020-08-01 NOTE — MAU Provider Note (Signed)
Event Date/Time   First Provider Initiated Contact with Patient 08/01/20 1617      S [name] is a 25 y.o. G0P0000 patient who presents to MAU today with complaint of wanting to know if she is pregnant. Patient reports she had a positive pregnancy test at home yesterday morning, a negative test last night and a positive test this morning. Patient reports she has been trying to get pregnant since December and came in today because she wants to know if she is pregnant or not. Patient reports she does have an OB/GYN in [place] and goes to [facility] for care. Patient denies pain or bleeding today.  O BP 122/79   Pulse 95   Temp 98.6 F (37 C)   Resp 18   Ht 4\' 11"  (1.499 m)   Wt 39.5 kg   LMP 06/17/2020 (Within Days)   BMI 17.57 kg/m    Patient Vitals for the past 24 hrs:  BP Temp Pulse Resp Height Weight  08/01/20 1547 122/79 98.6 F (37 C) 95 18 4\' 11"  (1.499 m) 39.5 kg   Physical Exam Vitals and nursing note reviewed.  Constitutional:      General: She is not in acute distress.    Appearance: Normal appearance. She is not ill-appearing, toxic-appearing or diaphoretic.  HENT:     Head: Normocephalic and atraumatic.  Pulmonary:     Effort: Pulmonary effort is normal.  Neurological:     Mental Status: She is alert and oriented to person, place, and time.  Psychiatric:        Mood and Affect: Mood normal.        Behavior: Behavior normal.        Thought Content: Thought content normal.        Judgment: Judgment normal.    A Medical screening exam complete UPT negative hCG drawn  P Discharge from MAU in stable condition Pt aware results will be provided via MyChart message, agrees with plan Warning signs for worsening condition that would warrant emergency follow-up discussed Patient may return to MAU as needed   Arianny Pun, 08/03/20, NP 08/01/2020 4:30 PM

## 2020-08-16 ENCOUNTER — Ambulatory Visit (INDEPENDENT_AMBULATORY_CARE_PROVIDER_SITE_OTHER): Payer: BC Managed Care – PPO

## 2020-08-16 ENCOUNTER — Other Ambulatory Visit: Payer: Self-pay

## 2020-08-16 VITALS — BP 99/65 | HR 91 | Ht 59.0 in | Wt 86.0 lb

## 2020-08-16 DIAGNOSIS — Z3200 Encounter for pregnancy test, result unknown: Secondary | ICD-10-CM

## 2020-08-16 DIAGNOSIS — Z3201 Encounter for pregnancy test, result positive: Secondary | ICD-10-CM

## 2020-08-16 LAB — POCT URINE PREGNANCY: Preg Test, Ur: POSITIVE — AB

## 2020-08-16 NOTE — Progress Notes (Addendum)
   NURSE VISIT- PREGNANCY CONFIRMATION   SUBJECTIVE:  Mary Singleton is a 25 y.o. G1P0000 female at [redacted]w[redacted]d by certain LMP of Patient's last menstrual period was 07/19/2020 (exact date). Here for pregnancy confirmation.  Home pregnancy test: positive x 2   She reports nausea.  She is taking prenatal vitamins.    OBJECTIVE:  BP 99/65 (BP Location: Right Arm, Patient Position: Sitting, Cuff Size: Normal)   Pulse 91   Ht 4\' 11"  (1.499 m)   Wt 86 lb (39 kg)   LMP 07/19/2020 (Exact Date)   BMI 17.37 kg/m   Appears well, in no apparent distress OB History  Gravida Para Term Preterm AB Living  1 0 0 0 0 0  SAB IAB Ectopic Multiple Live Births  0 0 0 0 0    # Outcome Date GA Lbr Len/2nd Weight Sex Delivery Anes PTL Lv  1 Current             Results for orders placed or performed in visit on 08/16/20 (from the past 24 hour(s))  POCT urine pregnancy   Collection Time: 08/16/20  2:54 PM  Result Value Ref Range   Preg Test, Ur Positive (A) Negative    ASSESSMENT: Positive pregnancy test, [redacted]w[redacted]d by LMP    PLAN: Schedule for dating ultrasound in 3-5 weeks Prenatal vitamins: continue   Nausea medicines: not currently needed   OB packet given: Yes  Jon Kasparek A Travin Marik  08/16/2020 2:56 PM   Chart reviewed for nurse visit. Agree with plan of care.  08/18/2020, Cheral Marker 08/16/2020 6:23 PM

## 2020-08-22 ENCOUNTER — Ambulatory Visit: Payer: Self-pay

## 2020-09-12 ENCOUNTER — Other Ambulatory Visit: Payer: Self-pay | Admitting: Obstetrics & Gynecology

## 2020-09-12 DIAGNOSIS — O3680X0 Pregnancy with inconclusive fetal viability, not applicable or unspecified: Secondary | ICD-10-CM

## 2020-09-13 ENCOUNTER — Ambulatory Visit (INDEPENDENT_AMBULATORY_CARE_PROVIDER_SITE_OTHER): Payer: BC Managed Care – PPO

## 2020-09-13 ENCOUNTER — Other Ambulatory Visit: Payer: Self-pay

## 2020-09-13 ENCOUNTER — Other Ambulatory Visit: Payer: Self-pay | Admitting: Obstetrics & Gynecology

## 2020-09-13 DIAGNOSIS — O3680X0 Pregnancy with inconclusive fetal viability, not applicable or unspecified: Secondary | ICD-10-CM | POA: Diagnosis not present

## 2020-09-13 DIAGNOSIS — Z3A08 8 weeks gestation of pregnancy: Secondary | ICD-10-CM | POA: Diagnosis not present

## 2020-09-13 NOTE — Progress Notes (Signed)
[place] DI/DI twins 9 wks,normal ovaries Baby A:right, CRL 22.14 mm,FHR 176 bpm Baby B: left,CRL 23.14 mm,FHR 169 bpm

## 2020-10-06 DIAGNOSIS — O0990 Supervision of high risk pregnancy, unspecified, unspecified trimester: Secondary | ICD-10-CM | POA: Insufficient documentation

## 2020-10-06 DIAGNOSIS — O30049 Twin pregnancy, dichorionic/diamniotic, unspecified trimester: Secondary | ICD-10-CM | POA: Insufficient documentation

## 2020-10-10 ENCOUNTER — Other Ambulatory Visit: Payer: Self-pay | Admitting: Obstetrics & Gynecology

## 2020-10-10 DIAGNOSIS — O30042 Twin pregnancy, dichorionic/diamniotic, second trimester: Secondary | ICD-10-CM

## 2020-10-10 DIAGNOSIS — Z3682 Encounter for antenatal screening for nuchal translucency: Secondary | ICD-10-CM

## 2020-10-11 ENCOUNTER — Ambulatory Visit (INDEPENDENT_AMBULATORY_CARE_PROVIDER_SITE_OTHER): Payer: BC Managed Care – PPO | Admitting: Women's Health

## 2020-10-11 ENCOUNTER — Encounter: Payer: Self-pay | Admitting: Women's Health

## 2020-10-11 ENCOUNTER — Ambulatory Visit: Payer: BC Managed Care – PPO | Admitting: *Deleted

## 2020-10-11 ENCOUNTER — Other Ambulatory Visit: Payer: Self-pay

## 2020-10-11 ENCOUNTER — Ambulatory Visit (INDEPENDENT_AMBULATORY_CARE_PROVIDER_SITE_OTHER): Payer: BC Managed Care – PPO

## 2020-10-11 VITALS — BP 95/65 | HR 85 | Wt 85.0 lb

## 2020-10-11 DIAGNOSIS — Z3682 Encounter for antenatal screening for nuchal translucency: Secondary | ICD-10-CM | POA: Diagnosis not present

## 2020-10-11 DIAGNOSIS — O30042 Twin pregnancy, dichorionic/diamniotic, second trimester: Secondary | ICD-10-CM

## 2020-10-11 DIAGNOSIS — O0992 Supervision of high risk pregnancy, unspecified, second trimester: Secondary | ICD-10-CM

## 2020-10-11 DIAGNOSIS — O30041 Twin pregnancy, dichorionic/diamniotic, first trimester: Secondary | ICD-10-CM

## 2020-10-11 DIAGNOSIS — Z3A13 13 weeks gestation of pregnancy: Secondary | ICD-10-CM

## 2020-10-11 DIAGNOSIS — O0991 Supervision of high risk pregnancy, unspecified, first trimester: Secondary | ICD-10-CM

## 2020-10-11 LAB — POCT URINALYSIS DIPSTICK OB
Blood, UA: NEGATIVE
Glucose, UA: NEGATIVE
Leukocytes, UA: NEGATIVE
Nitrite, UA: NEGATIVE

## 2020-10-11 MED ORDER — BLOOD PRESSURE MONITOR MISC: 0 refills | Status: DC

## 2020-10-11 MED ORDER — ASPIRIN 81 MG PO TBEC: 81.0000 mg | DELAYED_RELEASE_TABLET | Freq: Every day | ORAL | 3 refills | Status: DC

## 2020-10-11 NOTE — Progress Notes (Signed)
[place] 13 wks,DI/DI twins BABY A,right,anterior placenta,fhr 162 bpm,CRL 69.66 mm,NB present,NT 1.2 mm BABY B,left,anterior placenta,frh 162 bpm,CRL 70.03 mm,NB present,NT 1.3 mm

## 2020-10-11 NOTE — Progress Notes (Signed)
INITIAL OBSTETRICAL VISIT Patient name: Mary Singleton MRN [id number]  Date of birth: 02/10/1996 Chief Complaint:   Initial Prenatal Visit and Pregnancy Ultrasound  History of Present Illness:   Mary Singleton is a 25 y.o. G45P0000 Caucasian female at [redacted]w[redacted]d by [place] at 9 weeks with an Estimated Date of Delivery: 04/18/21 being seen today for her initial obstetrical visit.   Patient's last menstrual period was 07/19/2020. Her obstetrical history is significant for primigravida.   Today she reports no complaints.  Last pap never. Results were: N/A  Depression screen [facility] 2/9 10/11/2020 01/09/2017  Decreased Interest 0 0  Down, Depressed, Hopeless 0 0  PHQ - 2 Score 0 0  Altered sleeping 0 -  Tired, decreased energy 0 -  Change in appetite 0 -  Feeling bad or failure about yourself  0 -  Trouble concentrating 1 -  Moving slowly or fidgety/restless 0 -  Suicidal thoughts 0 -  PHQ-9 Score 1 -     GAD 7 : Generalized Anxiety Score 10/11/2020  Nervous, Anxious, on Edge 1  Control/stop worrying 0  Worry too much - different things 0  Trouble relaxing 0  Restless 0  Easily annoyed or irritable 1  Afraid - awful might happen 0  Total GAD 7 Score 2     Review of Systems:   Pertinent items are noted in HPI Denies cramping/contractions, leakage of fluid, vaginal bleeding, abnormal vaginal discharge w/ itching/odor/irritation, headaches, visual changes, shortness of breath, chest pain, abdominal pain, severe nausea/vomiting, or problems with urination or bowel movements unless otherwise stated above.  Pertinent History Reviewed:  Reviewed past medical,surgical, social, obstetrical and family history.  Reviewed problem list, medications and allergies. OB History  Gravida Para Term Preterm AB Living  1 0 0 0 0 0  SAB IAB Ectopic Multiple Live Births  0 0 0 1 0    # Outcome Date GA Lbr Len/2nd Weight Sex Delivery Anes PTL Lv  1 Current            Physical Assessment:   Vitals:    10/11/20 1441  BP: 95/65  Pulse: 85  Weight: 85 lb (38.6 kg)  Body mass index is 17.17 kg/m.       Physical Examination:  General appearance - well appearing, and in no distress  Mental status - alert, oriented to person, place, and time  Psych:  She has a normal mood and affect  Skin - warm and dry, normal color, no suspicious lesions noted  Chest - effort normal, all lung fields clear to auscultation bilaterally  Heart - normal rate and regular rhythm  Abdomen - soft, nontender  Extremities:  No swelling or varicosities noted  Pelvic - VULVA: normal appearing vulva with no masses, tenderness or lesions  VAGINA: normal appearing vagina with normal color and discharge, no lesions  CERVIX: normal appearing cervix without discharge or lesions, no CMT  Thin prep pap is done w/ HR HPV cotesting  Chaperone: Peggy Dones    TODAY'S NT  [place] 13 wks,DI/DI twins BABY A,right,anterior placenta,fhr 162 bpm,CRL 69.66 mm,NB present,NT 1.2 mm BABY B,left,anterior placenta,frh 162 bpm,CRL 70.03 mm,NB present,NT 1.3 mm  Results for orders placed or performed in visit on 10/11/20 (from the past 24 hour(s))  POC Urinalysis Dipstick OB   Collection Time: 10/11/20  3:01 PM  Result Value Ref Range   Color, UA     Clarity, UA     Glucose, UA Negative Negative   Bilirubin,  UA     Ketones, UA trace    Spec Grav, UA     Blood, UA neg    pH, UA     POC,PROTEIN,UA Trace Negative, Trace, Small (1+), Moderate (2+), Large (3+), 4+   Urobilinogen, UA     Nitrite, UA neg    Leukocytes, UA Negative Negative   Appearance     Odor      Assessment & Plan:  1) High-Risk Pregnancy G1P0000 at [redacted]w[redacted]d with an Estimated Date of Delivery: 04/18/21   2) Initial OB visit  3) Di-Di twins> start ASA  Meds:  Meds ordered this encounter  Medications   Blood Pressure Monitor MISC    Sig: For regular home bp monitoring during pregnancy    Dispense:  1 each    Refill:  0    Please mail to patient   aspirin 81 MG  EC tablet    Sig: Take 1 tablet (81 mg total) by mouth daily. Swallow whole.    Dispense:  90 tablet    Refill:  3    Order Specific Question:   Supervising Provider    Answer:   Duane Lope H [[id number]]    Initial labs obtained Continue prenatal vitamins Reviewed n/v relief measures and warning s/s to report Reviewed recommended weight gain based on pre-gravid BMI Encouraged well-balanced diet Genetic & carrier screening discussed: requests Panorama, NT/IT, and Horizon  Ultrasound discussed; fetal survey: requested CCNC completed> form faxed if has or is planning to apply for medicaid The nature of [facility] [employer] with multiple MDs and other Advanced Practice Providers was explained to patient; also emphasized that fellows, residents, and students are part of our team. Does not have home bp cuff. Office bp cuff given: yes. Rx sent: no. Check bp weekly, let us know if consistently >140/90.   Indications for ASA therapy (per uptodate) One of the following: Multifetal gestation Yes  Follow-up: Return in about 3 weeks (around 11/01/2020) for HROB, 2nd IT, MD or CNM, in person.   Orders Placed This Encounter  Procedures   Urine Culture   Integrated 1   CBC/D/Plt+RPR+Rh+ABO+RubIgG...   Pain Management Screening Profile (10S)   POC Urinalysis Dipstick OB    Cheral Marker CNM, [facility] 10/11/2020 3:26 PM

## 2020-10-11 NOTE — Patient Instructions (Signed)
Mary Singleton, thank you for choosing our office today! We appreciate the opportunity to meet your healthcare needs. You may receive a short survey by mail, e-mail, or through [employer]. If you are happy with your care we would appreciate if you could take just a few minutes to complete the survey questions. We read all of your comments and take your feedback very seriously. Thank you again for choosing our office.  [facility] at [place] at [facility] ([place], [place] [address]) Entrance C, located off of [address]   Nausea & Vomiting Have saltine crackers or pretzels by your bed and eat a few bites before you raise your head out of bed in the morning Eat small frequent meals throughout the day instead of large meals Drink plenty of fluids throughout the day to stay hydrated, just don't drink a lot of fluids with your meals.  This can make your stomach fill up faster making you feel sick Do not brush your teeth right after you eat Products with real ginger are good for nausea, like ginger ale and ginger hard candy Make sure it says made with real ginger! Sucking on sour candy like lemon heads is also good for nausea If your prenatal vitamins make you nauseated, take them at night so you will sleep through the nausea Sea Bands If you feel like you need medicine for the nausea & vomiting please let us know If you are unable to keep any fluids or food down please let us know   Constipation Drink plenty of fluid, preferably water, throughout the day Eat foods high in fiber such as fruits, vegetables, and grains Exercise, such as walking, is a good way to keep your bowels regular Drink warm fluids, especially warm prune juice, or decaf coffee Eat a 1/2 cup of real oatmeal (not instant), 1/2 cup applesauce, and 1/2-1 cup warm prune juice every day If needed, you may take Colace (docusate sodium) stool  softener once or twice a day to help keep the stool soft.  If you still are having problems with constipation, you may take Miralax once daily as needed to help keep your bowels regular.   Home Blood Pressure Monitoring for Patients   Your provider has recommended that you check your blood pressure (BP) at least once a week at home. If you do not have a blood pressure cuff at home, one will be provided for you. Contact your provider if you have not received your monitor within 1 week.   Helpful Tips for Accurate Home Blood Pressure Checks  Don't smoke, exercise, or drink caffeine 30 minutes before checking your BP Use the restroom before checking your BP (a full bladder can raise your pressure) Relax in a comfortable upright chair Feet on the ground Left arm resting comfortably on a flat surface at the level of your heart Legs uncrossed Back supported Sit quietly and don't talk Place the cuff on your bare arm Adjust snuggly, so that only two fingertips can fit between your skin and the top of the cuff Check 2 readings separated by at least one minute Keep a log of your BP readings For a visual, please reference this diagram: http://ccnc.care/bpdiagram  Provider Name: [facility]     Phone: [phone]  Zone 1: ALL CLEAR  Continue to monitor your symptoms:  BP reading is less than 140 (top number) or less than 90 (bottom  number)  No right upper stomach pain No headaches or seeing spots No feeling nauseated or throwing up No swelling in face and hands  Zone 2: CAUTION Call your doctor's office for any of the following:  BP reading is greater than 140 (top number) or greater than 90 (bottom number)  Stomach pain under your ribs in the middle or right side Headaches or seeing spots Feeling nauseated or throwing up Swelling in face and hands  Zone 3: EMERGENCY  Seek immediate medical care if you have any of the following:  BP reading is greater than160 (top number) or  greater than 110 (bottom number) Severe headaches not improving with Tylenol Serious difficulty catching your breath Any worsening symptoms from Zone 2    First Trimester of Pregnancy The first trimester of pregnancy is from week 1 until the end of week 12 (months 1 through 3). A week after a sperm fertilizes an egg, the egg will implant on the wall of the uterus. This embryo will begin to develop into a baby. Genes from you and your partner are forming the baby. The female genes determine whether the baby is a boy or a girl. At 6-8 weeks, the eyes and face are formed, and the heartbeat can be seen on ultrasound. At the end of 12 weeks, all the baby's organs are formed.  Now that you are pregnant, you will want to do everything you can to have a healthy baby. Two of the most important things are to get good prenatal care and to follow your health care provider's instructions. Prenatal care is all the medical care you receive before the baby's birth. This care will help prevent, find, and treat any problems during the pregnancy and childbirth. BODY CHANGES Your body goes through many changes during pregnancy. The changes vary from woman to woman.  You may gain or lose a couple of pounds at first. You may feel sick to your stomach (nauseous) and throw up (vomit). If the vomiting is uncontrollable, call your health care provider. You may tire easily. You may develop headaches that can be relieved by medicines approved by your health care provider. You may urinate more often. Painful urination may mean you have a bladder infection. You may develop heartburn as a result of your pregnancy. You may develop constipation because certain hormones are causing the muscles that push waste through your intestines to slow down. You may develop hemorrhoids or swollen, bulging veins (varicose veins). Your breasts may begin to grow larger and become tender. Your nipples may stick out more, and the tissue that  surrounds them (areola) may become darker. Your gums may bleed and may be sensitive to brushing and flossing. Dark spots or blotches (chloasma, mask of pregnancy) may develop on your face. This will likely fade after the baby is born. Your menstrual periods will stop. You may have a loss of appetite. You may develop cravings for certain kinds of food. You may have changes in your emotions from day to day, such as being excited to be pregnant or being concerned that something may go wrong with the pregnancy and baby. You may have more vivid and strange dreams. You may have changes in your hair. These can include thickening of your hair, rapid growth, and changes in texture. Some women also have hair loss during or after pregnancy, or hair that feels dry or thin. Your hair will most likely return to normal after your baby is born. WHAT TO EXPECT AT YOUR PRENATAL  VISITS During a routine prenatal visit: You will be weighed to make sure you and the baby are growing normally. Your blood pressure will be taken. Your abdomen will be measured to track your baby's growth. The fetal heartbeat will be listened to starting around week 10 or 12 of your pregnancy. Test results from any previous visits will be discussed. Your health care provider may ask you: How you are feeling. If you are feeling the baby move. If you have had any abnormal symptoms, such as leaking fluid, bleeding, severe headaches, or abdominal cramping. If you have any questions. Other tests that may be performed during your first trimester include: Blood tests to find your blood type and to check for the presence of any previous infections. They will also be used to check for low iron levels (anemia) and Rh antibodies. Later in the pregnancy, blood tests for diabetes will be done along with other tests if problems develop. Urine tests to check for infections, diabetes, or protein in the urine. An ultrasound to confirm the proper growth  and development of the baby. An amniocentesis to check for possible genetic problems. Fetal screens for spina bifida and Down syndrome. You may need other tests to make sure you and the baby are doing well. HOME CARE INSTRUCTIONS  Medicines Follow your health care provider's instructions regarding medicine use. Specific medicines may be either safe or unsafe to take during pregnancy. Take your prenatal vitamins as directed. If you develop constipation, try taking a stool softener if your health care provider approves. Diet Eat regular, well-balanced meals. Choose a variety of foods, such as meat or vegetable-based protein, fish, milk and low-fat dairy products, vegetables, fruits, and whole grain breads and cereals. Your health care provider will help you determine the amount of weight gain that is right for you. Avoid raw meat and uncooked cheese. These carry germs that can cause birth defects in the baby. Eating four or five small meals rather than three large meals a day may help relieve nausea and vomiting. If you start to feel nauseous, eating a few soda crackers can be helpful. Drinking liquids between meals instead of during meals also seems to help nausea and vomiting. If you develop constipation, eat more high-fiber foods, such as fresh vegetables or fruit and whole grains. Drink enough fluids to keep your urine clear or pale yellow. Activity and Exercise Exercise only as directed by your health care provider. Exercising will help you: Control your weight. Stay in shape. Be prepared for labor and delivery. Experiencing pain or cramping in the lower abdomen or low back is a good sign that you should stop exercising. Check with your health care provider before continuing normal exercises. Try to avoid standing for long periods of time. Move your legs often if you must stand in one place for a long time. Avoid heavy lifting. Wear low-heeled shoes, and practice good posture. You may  continue to have sex unless your health care provider directs you otherwise. Relief of Pain or Discomfort Wear a good support bra for breast tenderness.   Take warm sitz baths to soothe any pain or discomfort caused by hemorrhoids. Use hemorrhoid cream if your health care provider approves.   Rest with your legs elevated if you have leg cramps or low back pain. If you develop varicose veins in your legs, wear support hose. Elevate your feet for 15 minutes, 3-4 times a day. Limit salt in your diet. Prenatal Care Schedule your prenatal visits by the  twelfth week of pregnancy. They are usually scheduled monthly at first, then more often in the last 2 months before delivery. Write down your questions. Take them to your prenatal visits. Keep all your prenatal visits as directed by your health care provider. Safety Wear your seat belt at all times when driving. Make a list of emergency phone numbers, including numbers for family, friends, the hospital, and police and fire departments. General Tips Ask your health care provider for a referral to a local prenatal education class. Begin classes no later than at the beginning of month 6 of your pregnancy. Ask for help if you have counseling or nutritional needs during pregnancy. Your health care provider can offer advice or refer you to specialists for help with various needs. Do not use hot tubs, steam rooms, or saunas. Do not douche or use tampons or scented sanitary pads. Do not cross your legs for long periods of time. Avoid cat litter boxes and soil used by cats. These carry germs that can cause birth defects in the baby and possibly loss of the fetus by miscarriage or stillbirth. Avoid all smoking, herbs, alcohol, and medicines not prescribed by your health care provider. Chemicals in these affect the formation and growth of the baby. Schedule a dentist appointment. At home, brush your teeth with a soft toothbrush and be gentle when you floss. SEEK  MEDICAL CARE IF:  You have dizziness. You have mild pelvic cramps, pelvic pressure, or nagging pain in the abdominal area. You have persistent nausea, vomiting, or diarrhea. You have a bad smelling vaginal discharge. You have pain with urination. You notice increased swelling in your face, hands, legs, or ankles. SEEK IMMEDIATE MEDICAL CARE IF:  You have a fever. You are leaking fluid from your vagina. You have spotting or bleeding from your vagina. You have severe abdominal cramping or pain. You have rapid weight gain or loss. You vomit blood or material that looks like coffee grounds. You are exposed to [place] measles and have never had them. You are exposed to fifth disease or chickenpox. You develop a severe headache. You have shortness of breath. You have any kind of trauma, such as from a fall or a car accident. Document Released: 02/13/2001 Document Revised: 07/06/2013 Document Reviewed: 12/30/2012 [facility] Patient Information 2015 [place], [place]. This information is not intended to replace advice given to you by your health care provider. Make sure you discuss any questions you have with your health care provider.

## 2020-10-12 LAB — PMP SCREEN PROFILE (10S), URINE
Amphetamine Scrn, Ur: NEGATIVE ng/mL
BARBITURATE SCREEN URINE: NEGATIVE ng/mL
BENZODIAZEPINE SCREEN, URINE: NEGATIVE ng/mL
CANNABINOIDS UR QL SCN: NEGATIVE ng/mL
Cocaine (Metab) Scrn, Ur: NEGATIVE ng/mL
Creatinine(Crt), U: 169.8 mg/dL (ref 20.0–300.0)
Methadone Screen, Urine: NEGATIVE ng/mL
OXYCODONE+OXYMORPHONE UR QL SCN: NEGATIVE ng/mL
Opiate Scrn, Ur: NEGATIVE ng/mL
Ph of Urine: 5.7 (ref 4.5–8.9)
Phencyclidine Qn, Ur: NEGATIVE ng/mL
Propoxyphene Scrn, Ur: NEGATIVE ng/mL

## 2020-10-13 LAB — URINE CULTURE

## 2020-10-15 LAB — INTEGRATED 1
Crown Rump Length Twin B: 70 mm
Crown Rump Length: 69.7 mm
Gest. Age on Collection Date: 13 weeks
Maternal Age at EDD: 23.2 yr
NT Twin B: 1.3 mm
Nuchal Translucency (NT): 1.2 mm
Number of Fetuses: 2
PAPP-A Value: 4458.3 ng/mL
Weight: 85 [lb_av]

## 2020-10-15 LAB — CBC/D/PLT+RPR+RH+ABO+RUBIGG...
Antibody Screen: NEGATIVE
Basophils Absolute: 0 10*3/uL (ref 0.0–0.2)
Basos: 0 %
EOS (ABSOLUTE): 0.1 10*3/uL (ref 0.0–0.4)
Eos: 1 %
HCV Ab: <0.1 s/co ratio (ref 0.0–0.9)
HIV Screen 4th Generation wRfx: NONREACTIVE
Hematocrit: 33.5 % — ABNORMAL LOW (ref 34.0–46.6)
Hemoglobin: 11.5 g/dL (ref 11.1–15.9)
Hepatitis B Surface Ag: NEGATIVE
Immature Grans (Abs): 0 10*3/uL (ref 0.0–0.1)
Immature Granulocytes: 0 %
Lymphocytes Absolute: 2.1 10*3/uL (ref 0.7–3.1)
Lymphs: 21 %
MCH: 29.9 pg (ref 26.6–33.0)
MCHC: 34.3 g/dL (ref 31.5–35.7)
MCV: 87 fL (ref 79–97)
Monocytes Absolute: 0.6 10*3/uL (ref 0.1–0.9)
Monocytes: 6 %
Neutrophils Absolute: 7.1 10*3/uL — ABNORMAL HIGH (ref 1.4–7.0)
Neutrophils: 72 %
Platelets: 209 10*3/uL (ref 150–450)
RBC: 3.85 x10E6/uL (ref 3.77–5.28)
RDW: 13.4 % (ref 11.7–15.4)
RPR Ser Ql: NONREACTIVE
Rh Factor: POSITIVE
Rubella Antibodies, IGG: 1.28 index (ref >0.99)
WBC: 9.9 10*3/uL (ref 3.4–10.8)

## 2020-10-15 LAB — HCV INTERPRETATION

## 2020-11-01 ENCOUNTER — Other Ambulatory Visit: Payer: Self-pay

## 2020-11-01 ENCOUNTER — Ambulatory Visit (INDEPENDENT_AMBULATORY_CARE_PROVIDER_SITE_OTHER): Payer: BC Managed Care – PPO | Admitting: Women's Health

## 2020-11-01 ENCOUNTER — Encounter: Payer: Self-pay | Admitting: Women's Health

## 2020-11-01 VITALS — BP 110/73 | HR 103 | Wt 88.0 lb

## 2020-11-01 DIAGNOSIS — Z1379 Encounter for other screening for genetic and chromosomal anomalies: Secondary | ICD-10-CM

## 2020-11-01 DIAGNOSIS — O30042 Twin pregnancy, dichorionic/diamniotic, second trimester: Secondary | ICD-10-CM

## 2020-11-01 DIAGNOSIS — O0992 Supervision of high risk pregnancy, unspecified, second trimester: Secondary | ICD-10-CM

## 2020-11-01 DIAGNOSIS — Z363 Encounter for antenatal screening for malformations: Secondary | ICD-10-CM

## 2020-11-01 DIAGNOSIS — B009 Herpesviral infection, unspecified: Secondary | ICD-10-CM

## 2020-11-01 DIAGNOSIS — N9089 Other specified noninflammatory disorders of vulva and perineum: Secondary | ICD-10-CM

## 2020-11-01 MED ORDER — DOXYLAMINE-PYRIDOXINE 10-10 MG PO TBEC: DELAYED_RELEASE_TABLET | ORAL | 6 refills | Status: DC

## 2020-11-01 NOTE — Patient Instructions (Signed)
Mary Singleton, thank you for choosing our office today! We appreciate the opportunity to meet your healthcare needs. You may receive a short survey by mail, e-mail, or through MyChart. If you are happy with your care we would appreciate if you could take just a few minutes to complete the survey questions. We read all of your comments and take your feedback very seriously. Thank you again for choosing our office.  [facility] Team at [facility] [place] ([address] [place], [place] [address]) Entrance C, located off of [address] Free 24/7 valet parking  Go to [email or web address] to register for FREE online childbirth classes  Call the office ([phone]) or go to [facility] if: You begin to severe cramping Your water breaks.  Sometimes it is a big gush of fluid, sometimes it is just a trickle that keeps getting your panties wet or running down your legs You have vaginal bleeding.  It is normal to have a small amount of spotting if your cervix was checked.   [place] Pediatricians/Family Doctors [facility] ([facility]): [address], [phone]           [facility]: [address], [phone]                [facility] ([facility]): [address], [phone] (call to ask if accepting patients) [facility]: [address], [place], [phone]    [place] Pediatricians/Family Doctors [facility] ([facility]): [address], [phone] [facility]: [address], [phone] [facility]: [address], [phone]  [place] [facility]  [facility] ([facility]): [phone] [facility]: [address], [phone]   [place] [facility]: [address], [phone]  [place] [facility]  [facility]: [phone], [phone]  Home Blood Pressure Monitoring for Patients   Your provider has recommended that you check your blood pressure (BP) at least once a week at home. If you do not have a blood pressure cuff at home, one will be provided for you. Contact your provider if you have not received your monitor within 1 week.   Helpful Tips for Accurate Home Blood Pressure Checks  Don't smoke, exercise, or drink caffeine 30 minutes before checking your BP Use the restroom before checking your BP (a full bladder can raise your pressure) Relax in a comfortable upright chair Feet on the ground Left arm resting comfortably on a flat surface at the level of your heart Legs uncrossed Back supported Sit quietly and don't talk Place the cuff on your bare arm Adjust snuggly, so that only two fingertips can fit between your skin and the top of the cuff Check 2 readings separated by at least one minute Keep a log of your BP readings For a visual, please reference this diagram: http://[facility].care/bpdiagram  Provider Name: [facility]     Phone: [phone]  Zone 1: ALL CLEAR  Continue to monitor your symptoms:  BP reading is less than 140 (top number) or less than 90 (bottom number)  No right upper stomach pain No headaches or seeing spots No feeling nauseated or throwing up No swelling in face and hands  Zone 2: CAUTION Call your doctor's office for any of the following:  BP reading is greater than 140 (top number) or greater than   90 (bottom number)  Stomach pain under your ribs in the middle or right side Headaches or seeing spots Feeling nauseated or throwing up Swelling in face and hands  Zone 3: EMERGENCY  Seek immediate medical care if you have any of the following:  BP reading is greater than160 (top number) or greater than 110 (bottom number) Severe headaches not improving with Tylenol Serious difficulty catching your breath Any worsening symptoms from  Zone 2     Second Trimester of Pregnancy The second trimester is from week 14 through week 27 (months 4 through 6). The second trimester is often a time when you feel your best. Your body has adjusted to being pregnant, and you begin to feel better physically. Usually, morning sickness has lessened or quit completely, you may have more energy, and you may have an increase in appetite. The second trimester is also a time when the fetus is growing rapidly. At the end of the sixth month, the fetus is about 9 inches long and weighs about 1 pounds. You will likely begin to feel the baby move (quickening) between 16 and 20 weeks of pregnancy. Body changes during your second trimester Your body continues to go through many changes during your second trimester. The changes vary from woman to woman. Your weight will continue to increase. You will notice your lower abdomen bulging out. You may begin to get stretch marks on your hips, abdomen, and breasts. You may develop headaches that can be relieved by medicines. The medicines should be approved by your health care provider. You may urinate more often because the fetus is pressing on your bladder. You may develop or continue to have heartburn as a result of your pregnancy. You may develop constipation because certain hormones are causing the muscles that push waste through your intestines to slow down. You may develop hemorrhoids or swollen, bulging veins (varicose veins). You may have back pain. This is caused by: Weight gain. Pregnancy hormones that are relaxing the joints in your pelvis. A shift in weight and the muscles that support your balance. Your breasts will continue to grow and they will continue to become tender. Your gums may bleed and may be sensitive to brushing and flossing. Dark spots or blotches (chloasma, mask of pregnancy) may develop on your face. This will likely fade after the baby is born. A dark line from your belly button to  the pubic area (linea nigra) may appear. This will likely fade after the baby is born. You may have changes in your hair. These can include thickening of your hair, rapid growth, and changes in texture. Some women also have hair loss during or after pregnancy, or hair that feels dry or thin. Your hair will most likely return to normal after your baby is born.  What to expect at prenatal visits During a routine prenatal visit: You will be weighed to make sure you and the fetus are growing normally. Your blood pressure will be taken. Your abdomen will be measured to track your baby's growth. The fetal heartbeat will be listened to. Any test results from the previous visit will be discussed.  Your health care provider may ask you: How you are feeling. If you are feeling the baby move. If you have had any abnormal symptoms, such as leaking fluid, bleeding, severe headaches, or abdominal cramping. If you are using any tobacco products, including cigarettes, chewing tobacco, and electronic cigarettes. If you have any questions.  Other tests that may be performed during   your second trimester include: Blood tests that check for: Low iron levels (anemia). High blood sugar that affects pregnant women (gestational diabetes) between 24 and 28 weeks. Rh antibodies. This is to check for a protein on red blood cells (Rh factor). Urine tests to check for infections, diabetes, or protein in the urine. An ultrasound to confirm the proper growth and development of the baby. An amniocentesis to check for possible genetic problems. Fetal screens for spina bifida and Down syndrome. HIV (human immunodeficiency virus) testing. Routine prenatal testing includes screening for HIV, unless you choose not to have this test.  Follow these instructions at home: Medicines Follow your health care provider's instructions regarding medicine use. Specific medicines may be either safe or unsafe to take during  pregnancy. Take a prenatal vitamin that contains at least 600 micrograms (mcg) of folic acid. If you develop constipation, try taking a stool softener if your health care provider approves. Eating and drinking Eat a balanced diet that includes fresh fruits and vegetables, whole grains, good sources of protein such as meat, eggs, or tofu, and low-fat dairy. Your health care provider will help you determine the amount of weight gain that is right for you. Avoid raw meat and uncooked cheese. These carry germs that can cause birth defects in the baby. If you have low calcium intake from food, talk to your health care provider about whether you should take a daily calcium supplement. Limit foods that are high in fat and processed sugars, such as fried and sweet foods. To prevent constipation: Drink enough fluid to keep your urine clear or pale yellow. Eat foods that are high in fiber, such as fresh fruits and vegetables, whole grains, and beans. Activity Exercise only as directed by your health care provider. Most women can continue their usual exercise routine during pregnancy. Try to exercise for 30 minutes at least 5 days a week. Stop exercising if you experience uterine contractions. Avoid heavy lifting, wear low heel shoes, and practice good posture. A sexual relationship may be continued unless your health care provider directs you otherwise. Relieving pain and discomfort Wear a good support bra to prevent discomfort from breast tenderness. Take warm sitz baths to soothe any pain or discomfort caused by hemorrhoids. Use hemorrhoid cream if your health care provider approves. Rest with your legs elevated if you have leg cramps or low back pain. If you develop varicose veins, wear support hose. Elevate your feet for 15 minutes, 3-4 times a day. Limit salt in your diet. Prenatal Care Write down your questions. Take them to your prenatal visits. Keep all your prenatal visits as told by your health  care provider. This is important. Safety Wear your seat belt at all times when driving. Make a list of emergency phone numbers, including numbers for family, friends, the hospital, and police and fire departments. General instructions Ask your health care provider for a referral to a local prenatal education class. Begin classes no later than the beginning of month 6 of your pregnancy. Ask for help if you have counseling or nutritional needs during pregnancy. Your health care provider can offer advice or refer you to specialists for help with various needs. Do not use hot tubs, steam rooms, or saunas. Do not douche or use tampons or scented sanitary pads. Do not cross your legs for long periods of time. Avoid cat litter boxes and soil used by cats. These carry germs that can cause birth defects in the baby and possibly loss of the   fetus by miscarriage or stillbirth. Avoid all smoking, herbs, alcohol, and unprescribed drugs. Chemicals in these products can affect the formation and growth of the baby. Do not use any products that contain nicotine or tobacco, such as cigarettes and e-cigarettes. If you need help quitting, ask your health care provider. Visit your dentist if you have not gone yet during your pregnancy. Use a soft toothbrush to brush your teeth and be gentle when you floss. Contact a health care provider if: You have dizziness. You have mild pelvic cramps, pelvic pressure, or nagging pain in the abdominal area. You have persistent nausea, vomiting, or diarrhea. You have a bad smelling vaginal discharge. You have pain when you urinate. Get help right away if: You have a fever. You are leaking fluid from your vagina. You have spotting or bleeding from your vagina. You have severe abdominal cramping or pain. You have rapid weight gain or weight loss. You have shortness of breath with chest pain. You notice sudden or extreme swelling of your face, hands, ankles, feet, or legs. You  have not felt your baby move in over an hour. You have severe headaches that do not go away when you take medicine. You have vision changes. Summary The second trimester is from week 14 through week 27 (months 4 through 6). It is also a time when the fetus is growing rapidly. Your body goes through many changes during pregnancy. The changes vary from woman to woman. Avoid all smoking, herbs, alcohol, and unprescribed drugs. These chemicals affect the formation and growth your baby. Do not use any tobacco products, such as cigarettes, chewing tobacco, and e-cigarettes. If you need help quitting, ask your health care provider. Contact your health care provider if you have any questions. Keep all prenatal visits as told by your health care provider. This is important. This information is not intended to replace advice given to you by your health care provider. Make sure you discuss any questions you have with your health care provider. Document Released: 02/13/2001 Document Revised: 07/28/2015 Document Reviewed: 04/22/2012 Elsevier Interactive Patient Education  2017 Elsevier Inc.  

## 2020-11-01 NOTE — Progress Notes (Signed)
HIGH-RISK PREGNANCY VISIT Patient name: Mary Singleton MRN [id number]  Date of birth: [birth year] Chief Complaint:   Routine Prenatal Visit  History of Present Illness:   LAYNE LEBON is a 25 y.o. G1P0000 female at [redacted]w[redacted]d with an Estimated Date of Delivery: 04/18/21 being seen today for ongoing management of a high-risk pregnancy complicated by multiple gestation DCDA twins.    Today she reports  itchy vulvar bumps (like those she had in Jan)-at that time some were suspicious for HSV, rx'd valtrex- states it helped. HSV culture never resulted. States happens after she uses new pink soap. . Nausea, decreased appetite, concerned not gaining weight.  Contractions: Not present. Vag. Bleeding: None.  Movement: Absent. denies leaking of fluid.   Depression screen [facility] 2/9 10/11/2020 01/09/2017  Decreased Interest 0 0  Down, Depressed, Hopeless 0 0  PHQ - 2 Score 0 0  Altered sleeping 0 -  Tired, decreased energy 0 -  Change in appetite 0 -  Feeling bad or failure about yourself  0 -  Trouble concentrating 1 -  Moving slowly or fidgety/restless 0 -  Suicidal thoughts 0 -  PHQ-9 Score 1 -     GAD 7 : Generalized Anxiety Score 10/11/2020  Nervous, Anxious, on Edge 1  Control/stop worrying 0  Worry too much - different things 0  Trouble relaxing 0  Restless 0  Easily annoyed or irritable 1  Afraid - awful might happen 0  Total GAD 7 Score 2     Review of Systems:   Pertinent items are noted in HPI Denies abnormal vaginal discharge w/ itching/odor/irritation, headaches, visual changes, shortness of breath, chest pain, abdominal pain, severe nausea/vomiting, or problems with urination or bowel movements unless otherwise stated above. Pertinent History Reviewed:  Reviewed past medical,surgical, social, obstetrical and family history.  Reviewed problem list, medications and allergies. Physical Assessment:   Vitals:   11/01/20 1409  BP: 110/73  Pulse: (!) 103  Weight: 88 lb (39.9 kg)   Body mass index is 17.77 kg/m.           Physical Examination:   General appearance: alert, well appearing, and in no distress  Mental status: alert, oriented to person, place, and time  Skin: warm & dry   Extremities: Edema: None    Cardiovascular: normal heart rate noted  Respiratory: normal respiratory effort, no distress  Abdomen: gravid, soft, non-tender  Pelvic:  multiple raised bumps on mons- doesn't appear to be HSV, culture obtained anyway.           Fetal Status: Fetal Heart Rate (bpm): + u/s x 2   Movement: Absent    Fetal Surveillance Testing today: informal TA u/s, +FCA and active fetus x 2  Chaperone: Peggy Dones    No results found for this or any previous visit (from the past 24 hour(s)).  Assessment & Plan:  High-risk pregnancy: G1P0000 at [redacted]w[redacted]d with an Estimated Date of Delivery: 04/18/21   1) DCDA twins, stable, ASA  2) Nausea/decreased appetite/BMI 17, rx diclegis, can drink Boost/Ensure  3) Vulvar bumps> HSV culture/labs, will wait on results  Meds:  Meds ordered this encounter  Medications   Doxylamine-Pyridoxine (DICLEGIS) 10-10 MG TBEC    Sig: 2 tabs q hs, if sx persist add 1 tab q am on day 3, if sx persist add 1 tab q afternoon on day 4    Dispense:  100 tablet    Refill:  6    Order Specific Question:  Supervising Provider    Answer:   Lazaro Arms [[id number]]     Labs/procedures today: 2nd IT and pelvic exam/labs  Treatment Plan:   U/S q4wks    2x/wk testing @ 36wks or weekly BPP    Deliver @ 38wks:____   Reviewed: Preterm labor symptoms and general obstetric precautions including but not limited to vaginal bleeding, contractions, leaking of fluid and fetal movement were reviewed in detail with the patient.  All questions were answered. Does have home bp cuff. Office bp cuff given: not applicable. Check bp weekly, let us know if consistently >140 and/or >90.  Follow-up: Return in about 3 weeks (around 11/22/2020) for HROB, Twins, QQ:VZDGLOV, MD  or CNM, in person.   Future Appointments  Date Time Provider Department Center  11/23/2020  3:00 PM [facility] [place] [facility] None  11/23/2020  4:10 PM Arabella Merles, CNM [facility]    Orders Placed This Encounter  Procedures   Herpes simplex virus culture   US OB Comp + 14 Wk   US OB Comp AddL Gest + 14 Wk   INTEGRATED 2   HSV(herpes simplex vrs) 1+2 ab-IgG   Cheral Marker CNM, [facility] 11/01/2020 3:26 PM

## 2020-11-03 DIAGNOSIS — B009 Herpesviral infection, unspecified: Secondary | ICD-10-CM | POA: Insufficient documentation

## 2020-11-03 LAB — HERPES SIMPLEX VIRUS CULTURE

## 2020-11-03 MED ORDER — ACYCLOVIR 400 MG PO TABS: 400.0000 mg | ORAL_TABLET | Freq: Three times a day (TID) | ORAL | 3 refills | Status: DC

## 2020-11-03 NOTE — Addendum Note (Signed)
Addended by: Shawna Clamp R on: 11/03/2020 05:01 PM   Modules accepted: Orders

## 2020-11-06 ENCOUNTER — Other Ambulatory Visit: Payer: Self-pay

## 2020-11-06 DIAGNOSIS — R102 Pelvic and perineal pain: Secondary | ICD-10-CM | POA: Insufficient documentation

## 2020-11-06 DIAGNOSIS — O26892 Other specified pregnancy related conditions, second trimester: Secondary | ICD-10-CM | POA: Diagnosis present

## 2020-11-06 DIAGNOSIS — Z7982 Long term (current) use of aspirin: Secondary | ICD-10-CM | POA: Insufficient documentation

## 2020-11-06 DIAGNOSIS — Z3A17 17 weeks gestation of pregnancy: Secondary | ICD-10-CM | POA: Insufficient documentation

## 2020-11-06 DIAGNOSIS — R109 Unspecified abdominal pain: Secondary | ICD-10-CM | POA: Insufficient documentation

## 2020-11-07 ENCOUNTER — Encounter (HOSPITAL_COMMUNITY): Payer: Self-pay

## 2020-11-07 ENCOUNTER — Emergency Department (HOSPITAL_COMMUNITY)
Admission: EM | Admit: 2020-11-07 | Discharge: 2020-11-07 | Disposition: A | Discharge status: Home / Self Care | Payer: BC Managed Care – PPO | Attending: Emergency Medicine | Admitting: Emergency Medicine

## 2020-11-07 ENCOUNTER — Other Ambulatory Visit: Payer: Self-pay

## 2020-11-07 DIAGNOSIS — N949 Unspecified condition associated with female genital organs and menstrual cycle: Secondary | ICD-10-CM

## 2020-11-07 DIAGNOSIS — O26892 Other specified pregnancy related conditions, second trimester: Secondary | ICD-10-CM | POA: Diagnosis not present

## 2020-11-07 LAB — HCG, QUANTITATIVE, PREGNANCY: hCG, Beta Chain, Quant, S: 62951 m[IU]/mL — ABNORMAL HIGH (ref <5)

## 2020-11-07 NOTE — ED Triage Notes (Signed)
Pt arrived via POV w/ mid right abdominal cramps [redacted] weeks pregnant with twins. Pt stated that she just found out that her grandfather passed away et thinks that maybe that is what caused the cramps to start

## 2020-11-07 NOTE — ED Provider Notes (Signed)
[facility] EMERGENCY DEPARTMENT Provider Note   CSN: [id number] Arrival date & time: 11/06/20  2354     History Chief Complaint  Patient presents with   Abdominal Pain    17 weeks with twins    Mary Singleton is a 25 y.o. female.  Patient presents to the emergency department for evaluation of abdominal pain.  Patient reports that she is [redacted] weeks pregnant with twins.  She started having some cramping pain on the right mid abdomen just before coming to the ER tonight.  She reports that her grandfather died a couple of weeks ago when she was getting very upset tonight about the loss before the pain started.  No vomiting, diarrhea, constipation.  She has not had any fever.  Patient denies vaginal bleeding, discharge.  She has not had any trauma.      History reviewed. No pertinent past medical history.  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   Dizzy spells 07/17/2018   Gastric outlet obstruction 03/22/2017    Past Surgical History:  Procedure Laterality Date   BIOPSY  08/16/2017   Procedure: BIOPSY;  Surgeon: Malissa Hippo, MD;  Location: [facility] SU[facility];  Service: Endoscopy;;  duodenum   ESOPHAGOGASTRODUODENOSCOPY (EGD) WITH PROPOFOL N/A 08/16/2017   Procedure: ESOPHAGOGASTRODUODENOSCOPY (EGD) WITH PROPOFOL;  Surgeon: Malissa Hippo, MD;  Location: [facility];  Service: Endoscopy;  Laterality: N/A;  8:30   EYE SURGERY       OB History     Gravida  1   Para  0   Term  0   Preterm  0   AB  0   Living  0      SAB  0   IAB  0   Ectopic  0   Multiple  1   Live Births  0           Family History  Problem Relation Age of Onset   Healthy Mother    Cancer Maternal Grandmother     Social History   Tobacco Use   Smoking status: Never   Smokeless tobacco: Never  Vaping Use   Vaping Use: Never used   Substance Use Topics   Alcohol use: No   Drug use: No    Home Medications Prior to Admission medications   Medication Sig Start Date End Date Taking? Authorizing Provider  acyclovir (ZOVIRAX) 400 MG tablet Take 1 tablet (400 mg total) by mouth 3 (three) times daily. 11/03/20   Cheral Marker, CNM  aspirin 81 MG EC tablet Take 1 tablet (81 mg total) by mouth daily. Swallow whole. 10/11/20   Cheral Marker, CNM  Blood Pressure Monitor MISC For regular home bp monitoring during pregnancy 10/11/20   Cheral Marker, CNM  Doxylamine-Pyridoxine (DICLEGIS) 10-10 MG TBEC 2 tabs q hs, if sx persist add 1 tab q am on day 3, if sx persist add 1 tab q afternoon on day 4 11/01/20   Cheral Marker, CNM  Prenatal Vit-Fe Fumarate-FA (MULTIVITAMIN-PRENATAL) 27-0.8 MG TABS tablet Take 1 tablet by mouth daily at 12 noon.    [provider]  clonazePAM Scarlette Calico) 0.5 MG tablet  04/09/18 04/06/19  [provider]    Allergies    Meloxicam  Review of Systems   Review of Systems  Gastrointestinal:  Positive for abdominal pain.  All other  systems reviewed and are negative.  Physical Exam Updated Vital Signs BP 98/61   Pulse 89   Temp 98.3 F (36.8 C) (Oral)   Resp 20   Ht 4\' 11"  (1.499 m)   Wt 39 kg   LMP 07/19/2020   SpO2 100%   BMI 17.37 kg/m   Physical Exam Vitals and nursing note reviewed.  Constitutional:      General: She is not in acute distress.    Appearance: Normal appearance. She is well-developed.  HENT:     Head: Normocephalic and atraumatic.     Right Ear: Hearing normal.     Left Ear: Hearing normal.     Nose: Nose normal.  Eyes:     Conjunctiva/sclera: Conjunctivae normal.     Pupils: Pupils are equal, round, and reactive to light.  Cardiovascular:     Rate and Rhythm: Regular rhythm.     Heart sounds: S1 normal and S2 normal. No murmur heard.   No friction rub. No gallop.  Pulmonary:     Effort: Pulmonary effort is normal. No respiratory  distress.     Breath sounds: Normal breath sounds.  Chest:     Chest wall: No tenderness.  Abdominal:     General: Bowel sounds are normal.     Palpations: Abdomen is soft.     Tenderness: There is no abdominal tenderness. There is no guarding or rebound. Negative signs include Murphy's sign and McBurney's sign.     Hernia: No hernia is present.     Comments: Gravid abdomen, no focal tenderness  Musculoskeletal:        General: Normal range of motion.     Cervical back: Normal range of motion and neck supple.  Skin:    General: Skin is warm and dry.     Findings: No rash.  Neurological:     Mental Status: She is alert and oriented to person, place, and time.     GCS: GCS eye subscore is 4. GCS verbal subscore is 5. GCS motor subscore is 6.     Cranial Nerves: No cranial nerve deficit.     Sensory: No sensory deficit.     Coordination: Coordination normal.  Psychiatric:        Speech: Speech normal.        Behavior: Behavior normal.        Thought Content: Thought content normal.    ED Results / Procedures / Treatments   Labs (all labs ordered are listed, but only abnormal results are displayed) Labs Reviewed  HCG, QUANTITATIVE, PREGNANCY    EKG None  Radiology No results found.  Procedures Procedures   Medications Ordered in ED Medications - No data to display  ED Course  I have reviewed the triage vital signs and the nursing notes.  Pertinent labs & imaging results that were available during my care of the patient were reviewed by me and considered in my medical decision making (see chart for details).    MDM Rules/Calculators/A&P                           Patient presents for evaluation of abdominal pain.  Patient is approximately [redacted] weeks pregnant with twins.  She reports that she has had some emotional distress recently after her grandfather died.  Pain began tonight.  She is describing mild cramps in the right mid abdomen.  No right upper quadrant  tenderness on exam.  No tenderness at  McBurney's point.  Vital signs are normal.  Fetal heart tones are identified.  She has not had any vaginal bleeding or discharge.  She has declined pelvic exam.  Exam is reassuring.  Will obtain beta hCG for OB/GYN follow-up.  Symptoms most likely round ligament pain.  Tylenol as needed for pain.  Return for fever, worsening pain, bleeding, discharge.  Final Clinical Impression(s) / ED Diagnoses Final diagnoses:  Round ligament pain    Rx / DC Orders ED Discharge Orders     None        Alphonsine Minium, Canary Brim, MD 11/07/20 [phone]

## 2020-11-07 NOTE — Discharge Instructions (Addendum)
Use Tylenol as needed for pain.  If your pain worsens, you develop fever, vaginal bleeding or vaginal discharge, return to the ER for repeat evaluation.  Call your OB/GYN Tuesday for follow-up.

## 2020-11-09 LAB — INTEGRATED 2
AFP MoM: 1.85
Alpha-Fetoprotein: 80.9 ng/mL
Crown Rump Length Twin B: 70 mm
Crown Rump Length: 69.7 mm
DIA MoM: 2.24
DIA Value: 526.6 pg/mL
Estriol, Unconjugated: 2.95 ng/mL
Gest. Age on Collection Date: 13 weeks
Gestational Age: 16 weeks
Maternal Age at EDD: 23.2 yr
NT MoM Twin B: 0.82
NT Twin B: 1.3 mm
Nuchal Translucency (NT): 1.2 mm
Nuchal Translucency MoM: 0.76
Number of Fetuses: 2
PAPP-A MoM: 1.96
PAPP-A Value: 4458.3 ng/mL
Weight: 85 [lb_av]
Weight: 85 [lb_av]
hCG MoM: 1.56
hCG Value: 78.6 IU/mL
uE3 MoM: 2.79

## 2020-11-10 ENCOUNTER — Encounter: Payer: Self-pay | Admitting: Obstetrics & Gynecology

## 2020-11-23 ENCOUNTER — Other Ambulatory Visit: Payer: Self-pay

## 2020-11-23 ENCOUNTER — Ambulatory Visit (INDEPENDENT_AMBULATORY_CARE_PROVIDER_SITE_OTHER): Payer: BC Managed Care – PPO

## 2020-11-23 ENCOUNTER — Ambulatory Visit (INDEPENDENT_AMBULATORY_CARE_PROVIDER_SITE_OTHER): Payer: BC Managed Care – PPO | Admitting: Advanced Practice Midwife

## 2020-11-23 VITALS — BP 101/72 | HR 95 | Wt 92.4 lb

## 2020-11-23 DIAGNOSIS — Z3A19 19 weeks gestation of pregnancy: Secondary | ICD-10-CM | POA: Diagnosis not present

## 2020-11-23 DIAGNOSIS — O0992 Supervision of high risk pregnancy, unspecified, second trimester: Secondary | ICD-10-CM

## 2020-11-23 DIAGNOSIS — O30042 Twin pregnancy, dichorionic/diamniotic, second trimester: Secondary | ICD-10-CM

## 2020-11-23 DIAGNOSIS — Z363 Encounter for antenatal screening for malformations: Secondary | ICD-10-CM | POA: Diagnosis not present

## 2020-11-23 NOTE — Progress Notes (Signed)
HIGH-RISK PREGNANCY VISIT Patient name: Mary Singleton MRN [id number]  Date of birth: 11-14-1995 Chief Complaint:   Routine Prenatal Visit  History of Present Illness:   Mary Singleton is a 25 y.o. G1P0000 female at [redacted]w[redacted]d with an Estimated Date of Delivery: 04/18/21 being seen today for ongoing management of a high-risk pregnancy complicated by multiple gestation DC/DA twins.    Today she reports no complaints. Contractions: Not present. Vag. Bleeding: None.  Movement: Present. denies leaking of fluid.   Depression screen [facility] 2/9 10/11/2020 01/09/2017  Decreased Interest 0 0  Down, Depressed, Hopeless 0 0  PHQ - 2 Score 0 0  Altered sleeping 0 -  Tired, decreased energy 0 -  Change in appetite 0 -  Feeling bad or failure about yourself  0 -  Trouble concentrating 1 -  Moving slowly or fidgety/restless 0 -  Suicidal thoughts 0 -  PHQ-9 Score 1 -     GAD 7 : Generalized Anxiety Score 10/11/2020  Nervous, Anxious, on Edge 1  Control/stop worrying 0  Worry too much - different things 0  Trouble relaxing 0  Restless 0  Easily annoyed or irritable 1  Afraid - awful might happen 0  Total GAD 7 Score 2     Review of Systems:   Pertinent items are noted in HPI Denies abnormal vaginal discharge w/ itching/odor/irritation, headaches, visual changes, shortness of breath, chest pain, abdominal pain, severe nausea/vomiting, or problems with urination or bowel movements unless otherwise stated above. Pertinent History Reviewed:  Reviewed past medical,surgical, social, obstetrical and family history.  Reviewed problem list, medications and allergies. Physical Assessment:   Vitals:   11/23/20 1603  BP: 101/72  Pulse: 95  Weight: 92 lb 6.4 oz (41.9 kg)  Body mass index is 18.66 kg/m.           Physical Examination:   General appearance: alert, well appearing, and in no distress  Mental status: alert, oriented to person, place, and time  Skin: warm & dry   Extremities: Edema:  None    Cardiovascular: normal heart rate noted  Respiratory: normal respiratory effort, no distress  Abdomen: gravid, soft, non-tender  Pelvic: Cervical exam deferred         Fetal Status: Fetal Heart Rate (bpm): 164/140 u/s   Movement: Present    Fetal Surveillance Testing today:  [place] 19+1 wks,DA/DC TWINS,normal ovaries,cx 2.9 cm BABY A:female,cephalic right,anterior placenta 0,FHR 164 bpm,SVP of fluid 5 cm,EFW 278 g 46%,anatomy complete,no obvious abnormalities  BABY B:female,cephalic left,anterior placenta gr 0,FHR 140 bpm,SVP of fluid 4.3 cm,EFW 288 g 57%.anatomy complete,no obvious abnormalities,discordance 3.5%     No results found for this or any previous visit (from the past 24 hour(s)).  Assessment & Plan:  High-risk pregnancy: G1P0000 at [redacted]w[redacted]d with an Estimated Date of Delivery: 04/18/21   1) DC/DA twins, stable; anatomy scan today; will continue w growth q 4wks   Meds: No orders of the defined types were placed in this encounter.   Labs/procedures today: U/S  Treatment Plan:  growth q 4wks; if concordant, weekly testing @ 36wks with IOL 38wks  Reviewed: Preterm labor symptoms and general obstetric precautions including but not limited to vaginal bleeding, contractions, leaking of fluid and fetal movement were reviewed in detail with the patient.  All questions were answered. Does have home bp cuff. Office bp cuff given: not applicable. Check bp weekly, let us know if consistently >140 and/or >90.  Follow-up: Return in about 5 weeks (around 12/28/2020)  for HROB, [place]: EFW, Twins.   Future Appointments  Date Time Provider Department Center  12/28/2020  3:00 PM [facility] [place] [facility] None  12/28/2020  4:10 PM Myna Hidalgo, DO [facility]    Orders Placed This Encounter  Procedures   US OB Follow Up   US OB Follow Up AddL Gest   Arabella Merles [facility] 11/23/2020 4:39 PM

## 2020-11-23 NOTE — Patient Instructions (Signed)
Evalise, thank you for choosing our office today! We appreciate the opportunity to meet your healthcare needs. You may receive a short survey by mail, e-mail, or through MyChart. If you are happy with your care we would appreciate if you could take just a few minutes to complete the survey questions. We read all of your comments and take your feedback very seriously. Thank you again for choosing our office.  [facility] Team at [facility] [place] ([address] [place], [place] [address]) Entrance C, located off of [address] Free 24/7 valet parking  Go to [email or web address] to register for FREE online childbirth classes  Call the office ([phone]) or go to [facility] if: You begin to severe cramping Your water breaks.  Sometimes it is a big gush of fluid, sometimes it is just a trickle that keeps getting your panties wet or running down your legs You have vaginal bleeding.  It is normal to have a small amount of spotting if your cervix was checked.   [place] Pediatricians/Family Doctors [facility] ([facility]): [address], [phone]           [facility]: [address], [phone]                [facility] ([facility]): [address], [phone] (call to ask if accepting patients) [facility]: [address], [place], [phone]    [place] Pediatricians/Family Doctors [facility] ([facility]): [address], [phone] [facility]: [address], [phone] [facility]: [address], [phone]  [place] [facility]  [facility] ([facility]): [phone] [facility]: [address], [phone]   [place] Family Doctors [facility]: [address], [phone]  [place] [facility]  [facility]: [address], [phone]  Home Blood Pressure Monitoring for Patients   Your provider has recommended that you check your blood pressure (BP) at least once a week at home. If you do not have a blood pressure cuff at home, one will be provided for you. Contact your provider if you have not received your monitor within 1 week.   Helpful Tips for Accurate Home Blood Pressure Checks  Don't smoke, exercise, or drink caffeine 30 minutes before checking your BP Use the restroom before checking your BP (a full bladder can raise your pressure) Relax in a comfortable upright chair Feet on the ground Left arm resting comfortably on a flat surface at the level of your heart Legs uncrossed Back supported Sit quietly and don't talk Place the cuff on your bare arm Adjust snuggly, so that only two fingertips can fit between your skin and the top of the cuff Check 2 readings separated by at least one minute Keep a log of your BP readings For a visual, please reference this diagram: http://[facility].care/bpdiagram  Provider Name: [facility]     Phone: [phone]  Zone 1: ALL CLEAR  Continue to monitor your symptoms:  BP reading is less than 140 (top number) or less than 90 (bottom number)  No right upper stomach pain No headaches or seeing spots No feeling nauseated or throwing up No swelling in face and hands  Zone 2: CAUTION Call your doctor's office for any of the following:  BP reading is greater than 140 (top number) or greater than   90 (bottom number)  Stomach pain under your ribs in the middle or right side Headaches or seeing spots Feeling nauseated or throwing up Swelling in face and hands  Zone 3: EMERGENCY  Seek immediate medical care if you have any of the following:  BP reading is greater than160 (top number) or greater than 110 (bottom number) Severe headaches not improving with Tylenol Serious difficulty catching your breath Any worsening symptoms from  Zone 2     Second Trimester of Pregnancy The second trimester is from week 14 through week 27 (months 4 through 6). The second trimester is often a time when you feel your best. Your body has adjusted to being pregnant, and you begin to feel better physically. Usually, morning sickness has lessened or quit completely, you may have more energy, and you may have an increase in appetite. The second trimester is also a time when the fetus is growing rapidly. At the end of the sixth month, the fetus is about 9 inches long and weighs about 1 pounds. You will likely begin to feel the baby move (quickening) between 16 and 20 weeks of pregnancy. Body changes during your second trimester Your body continues to go through many changes during your second trimester. The changes vary from woman to woman. Your weight will continue to increase. You will notice your lower abdomen bulging out. You may begin to get stretch marks on your hips, abdomen, and breasts. You may develop headaches that can be relieved by medicines. The medicines should be approved by your health care provider. You may urinate more often because the fetus is pressing on your bladder. You may develop or continue to have heartburn as a result of your pregnancy. You may develop constipation because certain hormones are causing the muscles that push waste through your intestines to slow down. You may develop hemorrhoids or swollen, bulging veins (varicose veins). You may have back pain. This is caused by: Weight gain. Pregnancy hormones that are relaxing the joints in your pelvis. A shift in weight and the muscles that support your balance. Your breasts will continue to grow and they will continue to become tender. Your gums may bleed and may be sensitive to brushing and flossing. Dark spots or blotches (chloasma, mask of pregnancy) may develop on your face. This will likely fade after the baby is born. A dark line from your belly button to  the pubic area (linea nigra) may appear. This will likely fade after the baby is born. You may have changes in your hair. These can include thickening of your hair, rapid growth, and changes in texture. Some women also have hair loss during or after pregnancy, or hair that feels dry or thin. Your hair will most likely return to normal after your baby is born.  What to expect at prenatal visits During a routine prenatal visit: You will be weighed to make sure you and the fetus are growing normally. Your blood pressure will be taken. Your abdomen will be measured to track your baby's growth. The fetal heartbeat will be listened to. Any test results from the previous visit will be discussed.  Your health care provider may ask you: How you are feeling. If you are feeling the baby move. If you have had any abnormal symptoms, such as leaking fluid, bleeding, severe headaches, or abdominal cramping. If you are using any tobacco products, including cigarettes, chewing tobacco, and electronic cigarettes. If you have any questions.  Other tests that may be performed during   your second trimester include: Blood tests that check for: Low iron levels (anemia). High blood sugar that affects pregnant women (gestational diabetes) between 24 and 28 weeks. Rh antibodies. This is to check for a protein on red blood cells (Rh factor). Urine tests to check for infections, diabetes, or protein in the urine. An ultrasound to confirm the proper growth and development of the baby. An amniocentesis to check for possible genetic problems. Fetal screens for spina bifida and Down syndrome. HIV (human immunodeficiency virus) testing. Routine prenatal testing includes screening for HIV, unless you choose not to have this test.  Follow these instructions at home: Medicines Follow your health care provider's instructions regarding medicine use. Specific medicines may be either safe or unsafe to take during  pregnancy. Take a prenatal vitamin that contains at least 600 micrograms (mcg) of folic acid. If you develop constipation, try taking a stool softener if your health care provider approves. Eating and drinking Eat a balanced diet that includes fresh fruits and vegetables, whole grains, good sources of protein such as meat, eggs, or tofu, and low-fat dairy. Your health care provider will help you determine the amount of weight gain that is right for you. Avoid raw meat and uncooked cheese. These carry germs that can cause birth defects in the baby. If you have low calcium intake from food, talk to your health care provider about whether you should take a daily calcium supplement. Limit foods that are high in fat and processed sugars, such as fried and sweet foods. To prevent constipation: Drink enough fluid to keep your urine clear or pale yellow. Eat foods that are high in fiber, such as fresh fruits and vegetables, whole grains, and beans. Activity Exercise only as directed by your health care provider. Most women can continue their usual exercise routine during pregnancy. Try to exercise for 30 minutes at least 5 days a week. Stop exercising if you experience uterine contractions. Avoid heavy lifting, wear low heel shoes, and practice good posture. A sexual relationship may be continued unless your health care provider directs you otherwise. Relieving pain and discomfort Wear a good support bra to prevent discomfort from breast tenderness. Take warm sitz baths to soothe any pain or discomfort caused by hemorrhoids. Use hemorrhoid cream if your health care provider approves. Rest with your legs elevated if you have leg cramps or low back pain. If you develop varicose veins, wear support hose. Elevate your feet for 15 minutes, 3-4 times a day. Limit salt in your diet. Prenatal Care Write down your questions. Take them to your prenatal visits. Keep all your prenatal visits as told by your health  care provider. This is important. Safety Wear your seat belt at all times when driving. Make a list of emergency phone numbers, including numbers for family, friends, the hospital, and police and fire departments. General instructions Ask your health care provider for a referral to a local prenatal education class. Begin classes no later than the beginning of month 6 of your pregnancy. Ask for help if you have counseling or nutritional needs during pregnancy. Your health care provider can offer advice or refer you to specialists for help with various needs. Do not use hot tubs, steam rooms, or saunas. Do not douche or use tampons or scented sanitary pads. Do not cross your legs for long periods of time. Avoid cat litter boxes and soil used by cats. These carry germs that can cause birth defects in the baby and possibly loss of the   fetus by miscarriage or stillbirth. Avoid all smoking, herbs, alcohol, and unprescribed drugs. Chemicals in these products can affect the formation and growth of the baby. Do not use any products that contain nicotine or tobacco, such as cigarettes and e-cigarettes. If you need help quitting, ask your health care provider. Visit your dentist if you have not gone yet during your pregnancy. Use a soft toothbrush to brush your teeth and be gentle when you floss. Contact a health care provider if: You have dizziness. You have mild pelvic cramps, pelvic pressure, or nagging pain in the abdominal area. You have persistent nausea, vomiting, or diarrhea. You have a bad smelling vaginal discharge. You have pain when you urinate. Get help right away if: You have a fever. You are leaking fluid from your vagina. You have spotting or bleeding from your vagina. You have severe abdominal cramping or pain. You have rapid weight gain or weight loss. You have shortness of breath with chest pain. You notice sudden or extreme swelling of your face, hands, ankles, feet, or legs. You  have not felt your baby move in over an hour. You have severe headaches that do not go away when you take medicine. You have vision changes. Summary The second trimester is from week 14 through week 27 (months 4 through 6). It is also a time when the fetus is growing rapidly. Your body goes through many changes during pregnancy. The changes vary from woman to woman. Avoid all smoking, herbs, alcohol, and unprescribed drugs. These chemicals affect the formation and growth your baby. Do not use any tobacco products, such as cigarettes, chewing tobacco, and e-cigarettes. If you need help quitting, ask your health care provider. Contact your health care provider if you have any questions. Keep all prenatal visits as told by your health care provider. This is important. This information is not intended to replace advice given to you by your health care provider. Make sure you discuss any questions you have with your health care provider. Document Released: 02/13/2001 Document Revised: 07/28/2015 Document Reviewed: 04/22/2012 Elsevier Interactive Patient Education  2017 [employer]vier Inc.  

## 2020-11-23 NOTE — Progress Notes (Signed)
[place] 19+1 wks,DA/DC TWINS,normal ovaries,cx 2.9 cm BABY A:female,cephalic right,anterior placenta 0,FHR 164 bpm,SVP of fluid 5 cm,EFW 278 g 46%,anatomy complete,no obvious abnormalities  BABY B:female,cephalic left,anterior placenta gr 0,FHR 140 bpm,SVP of fluid 4.3 cm,EFW 288 g 57%.anatomy complete,no obvious abnormalities,discordance 3.5%

## 2020-11-24 ENCOUNTER — Encounter: Payer: Self-pay | Admitting: Women's Health

## 2020-12-17 ENCOUNTER — Inpatient Hospital Stay (HOSPITAL_COMMUNITY)
Admission: AD | Admit: 2020-12-17 | Discharge: 2020-12-17 | Disposition: A | Discharge status: Home / Self Care | Payer: BC Managed Care – PPO | Attending: Family Medicine | Admitting: Family Medicine

## 2020-12-17 ENCOUNTER — Encounter (HOSPITAL_COMMUNITY): Payer: Self-pay | Admitting: Family Medicine

## 2020-12-17 ENCOUNTER — Other Ambulatory Visit: Payer: Self-pay

## 2020-12-17 DIAGNOSIS — O26892 Other specified pregnancy related conditions, second trimester: Secondary | ICD-10-CM | POA: Insufficient documentation

## 2020-12-17 DIAGNOSIS — O30042 Twin pregnancy, dichorionic/diamniotic, second trimester: Secondary | ICD-10-CM | POA: Diagnosis not present

## 2020-12-17 DIAGNOSIS — R42 Dizziness and giddiness: Secondary | ICD-10-CM

## 2020-12-17 DIAGNOSIS — Z3A22 22 weeks gestation of pregnancy: Secondary | ICD-10-CM | POA: Diagnosis not present

## 2020-12-17 DIAGNOSIS — O99891 Other specified diseases and conditions complicating pregnancy: Secondary | ICD-10-CM

## 2020-12-17 LAB — URINALYSIS, ROUTINE W REFLEX MICROSCOPIC
Bilirubin Urine: NEGATIVE
Glucose, UA: NEGATIVE mg/dL
Hgb urine dipstick: NEGATIVE
Ketones, ur: NEGATIVE mg/dL
Nitrite: NEGATIVE
Protein, ur: NEGATIVE mg/dL
Specific Gravity, Urine: 1.009 (ref 1.005–1.030)
pH: 7 (ref 5.0–8.0)

## 2020-12-17 LAB — GLUCOSE, CAPILLARY: Glucose-Capillary: 72 mg/dL (ref 70–99)

## 2020-12-17 NOTE — MAU Provider Note (Signed)
History     CSN: [id number]  Arrival date and time: 12/17/20 1505   Event Date/Time   First Provider Initiated Contact with Patient 12/17/20 1754      Chief Complaint  Patient presents with   Dizziness   Epistaxis   [name] is a 25 y.o. year old G46P0000 female at [redacted]w[redacted]d weeks gestation with Di-Di twins who presents to MAU reporting she started throwing up and RT nostril bleeding 10 minutes after eating chicken strips, fries and sweet tea at a local [employer] in [place], [place]. This all occurred at about 1300 today. The nose bleeding lasted about 5 minutes. She also complains not dizziness, nut her nausea is completely gone. Her FOB is present and contributing to the history taking.   OB History     Gravida  1   Para  0   Term  0   Preterm  0   AB  0   Living  0      SAB  0   IAB  0   Ectopic  0   Multiple  1   Live Births  0           History reviewed. No pertinent past medical history.  Past Surgical History:  Procedure Laterality Date   BIOPSY  08/16/2017   Procedure: BIOPSY;  Surgeon: Malissa Hippo, MD;  [facility];  Service: Endoscopy;;  duodenum   ESOPHAGOGASTRODUODENOSCOPY (EGD) WITH PROPOFOL N/A 08/16/2017   Procedure: ESOPHAGOGASTRODUODENOSCOPY (EGD) WITH PROPOFOL;  Surgeon: Malissa Hippo, MD;  Location: [facility];  Service: Endoscopy;  Laterality: N/A;  8:30   EYE SURGERY      Family History  Problem Relation Age of Onset   Healthy Mother    Cancer Maternal Grandmother     Social History   Tobacco Use   Smoking status: Never   Smokeless tobacco: Never  Vaping Use   Vaping Use: Never used  Substance Use Topics   Alcohol use: No   Drug use: No    Allergies:  Allergies  Allergen Reactions   Meloxicam Rash    Medications Prior to Admission  Medication Sig Dispense Refill Last Dose   aspirin 81 MG EC tablet Take 1 tablet (81 mg total) by mouth daily. Swallow whole. 90 tablet 3  12/16/2020   Prenatal Vit-Fe Fumarate-FA (MULTIVITAMIN-PRENATAL) 27-0.8 MG TABS tablet Take 1 tablet by mouth daily at 12 noon.   12/16/2020   acyclovir (ZOVIRAX) 400 MG tablet Take 1 tablet (400 mg total) by mouth 3 (three) times daily. 90 tablet 3    Blood Pressure Monitor MISC For regular home bp monitoring during pregnancy 1 each 0    Doxylamine-Pyridoxine (DICLEGIS) 10-10 MG TBEC 2 tabs q hs, if sx persist add 1 tab q am on day 3, if sx persist add 1 tab q afternoon on day 4 (Patient not taking: Reported on 11/23/2020) 100 tablet 6     Review of Systems  Constitutional: Negative.   HENT: Negative.    Eyes: Negative.   Respiratory: Negative.    Cardiovascular: Negative.   Gastrointestinal: Negative.   Endocrine: Negative.   Genitourinary: Negative.   Musculoskeletal: Negative.   Skin: Negative.   Allergic/Immunologic: Negative.   Neurological: Negative.   Hematological: Negative.   Psychiatric/Behavioral: Negative.    Physical Exam   Blood pressure 103/63, pulse (!) 107, temperature 98.3 F (36.8 C), temperature source Oral, resp. rate 18, height 4\' 11"  (1.499 m), weight 43.3  kg, last menstrual period 07/19/2020, SpO2 100 %.  Physical Exam Vitals and nursing note reviewed.  Constitutional:      Appearance: Normal appearance. She is normal weight.  Cardiovascular:     Rate and Rhythm: Normal rate.  Pulmonary:     Effort: Pulmonary effort is normal.  Genitourinary:    Comments: Not evaluated Musculoskeletal:        General: Normal range of motion.     Cervical back: Normal range of motion and neck supple.  Skin:    General: Skin is warm and dry.  Neurological:     Mental Status: She is alert and oriented to person, place, and time.  Psychiatric:        Mood and Affect: Mood normal.        Behavior: Behavior normal.        Thought Content: Thought content normal.        Judgment: Judgment normal.   FHTs by doppler: (Baby A) 145 bpm; (Baby B) 156 bpm MAU Course   Procedures  MDM Explained  Assessment and Plan  Dizziness - Plan: Discharge patient  Dichorionic diamniotic twin pregnancy in second trimester  [redacted] weeks gestation of pregnancy  - Advised to drink at least 10 16.9 oz bottles of water daily - Patient left before RN could get in room with D/C instructions   Raelyn Mora, CNM 12/17/2020, 5:55 PM

## 2020-12-17 NOTE — Progress Notes (Addendum)
POCT  CBG 72. Provider made aware

## 2020-12-17 NOTE — MAU Note (Signed)
Went out to eat, about  later she started throwing up and then her rt nostril started bleeding.  That is why they wanted her to get checked out. This was about 1300. Feels a little dizzy now, nausea has subsided.  Nose bleed lasted about 5 min. has been having pain in rt upper quad. (Not a new problem, has been told 'gas from the babies').

## 2020-12-17 NOTE — Progress Notes (Signed)
Patient was to be discharged. Provider stated that she notified patient to wait for paperwork and nurse. Patient left before RN got to room. Discharge instructions and vitals were unable to given and vitals unable to be obtained. Provider made aware.

## 2020-12-17 NOTE — Discharge Instructions (Signed)
Drink at least (10) 16.9 oz bottles of water every day.

## 2020-12-28 ENCOUNTER — Ambulatory Visit (INDEPENDENT_AMBULATORY_CARE_PROVIDER_SITE_OTHER): Payer: Medicaid Other | Admitting: Obstetrics & Gynecology

## 2020-12-28 ENCOUNTER — Other Ambulatory Visit: Payer: Self-pay

## 2020-12-28 ENCOUNTER — Encounter: Payer: Self-pay | Admitting: Obstetrics & Gynecology

## 2020-12-28 ENCOUNTER — Encounter: Payer: BC Managed Care – PPO | Admitting: Obstetrics & Gynecology

## 2020-12-28 ENCOUNTER — Ambulatory Visit (INDEPENDENT_AMBULATORY_CARE_PROVIDER_SITE_OTHER): Payer: BC Managed Care – PPO

## 2020-12-28 ENCOUNTER — Other Ambulatory Visit: Payer: BC Managed Care – PPO

## 2020-12-28 VITALS — BP 103/71 | HR 119 | Wt 98.2 lb

## 2020-12-28 DIAGNOSIS — O0992 Supervision of high risk pregnancy, unspecified, second trimester: Secondary | ICD-10-CM

## 2020-12-28 DIAGNOSIS — Z3A34 34 weeks gestation of pregnancy: Secondary | ICD-10-CM

## 2020-12-28 DIAGNOSIS — O30042 Twin pregnancy, dichorionic/diamniotic, second trimester: Secondary | ICD-10-CM

## 2020-12-28 DIAGNOSIS — Z3A24 24 weeks gestation of pregnancy: Secondary | ICD-10-CM

## 2020-12-28 LAB — POCT URINALYSIS DIPSTICK OB
Blood, UA: NEGATIVE
Glucose, UA: NEGATIVE
Ketones, UA: NEGATIVE
Leukocytes, UA: NEGATIVE
Nitrite, UA: NEGATIVE
POC,PROTEIN,UA: NEGATIVE

## 2020-12-28 NOTE — Progress Notes (Signed)
[place] 24+1 wks,DC/DA twins,CX 2.7 cm,normal ovaries BABY A: female,cephalic right,anterior placenta gr 0,SVP of fluid 6.8 cm,FHR 174 bpm,EFW 641 g 31%,discordance 1.9% BABY B: female,cephalic left,anterior placenta gr 0,SVP of fluid 4.2 cm,FHR 166 bpm,EFW 654 g 36%

## 2020-12-28 NOTE — Progress Notes (Signed)
HIGH-RISK PREGNANCY VISIT Patient name: Mary Singleton MRN [id number]  Date of birth: 1995-11-07 Chief Complaint:   Routine Prenatal Visit, High Risk Gestation, and Pregnancy Ultrasound  History of Present Illness:   Mary Singleton is a 25 y.o. G43P0000 female at [redacted]w[redacted]d with an Estimated Date of Delivery: 04/18/21 being seen today for ongoing management of a high-risk pregnancy complicated by:  DC/DA twin.    Today she reports no complaints.   Contractions: Irritability. Vag. Bleeding: None.  Movement: Present. denies leaking of fluid.   Depression screen [facility] 2/9 10/11/2020 01/09/2017  Decreased Interest 0 0  Down, Depressed, Hopeless 0 0  PHQ - 2 Score 0 0  Altered sleeping 0 -  Tired, decreased energy 0 -  Change in appetite 0 -  Feeling bad or failure about yourself  0 -  Trouble concentrating 1 -  Moving slowly or fidgety/restless 0 -  Suicidal thoughts 0 -  PHQ-9 Score 1 -     Current Outpatient Medications  Medication Instructions   acyclovir (ZOVIRAX) 400 mg, Oral, 3 times daily   aspirin 81 mg, Oral, Daily, Swallow whole.   Blood Pressure Monitor MISC For regular home bp monitoring during pregnancy   Doxylamine-Pyridoxine (DICLEGIS) 10-10 MG TBEC 2 tabs q hs, if sx persist add 1 tab q am on day 3, if sx persist add 1 tab q afternoon on day 4   Prenatal Vit-Fe Fumarate-FA (MULTIVITAMIN-PRENATAL) 27-0.8 MG TABS tablet 1 tablet, Oral, Daily     Review of Systems:   Pertinent items are noted in HPI Notes a white discharge on occasion, none currently.  No itching or irritation.  Denies headaches, visual changes, shortness of breath, chest pain, abdominal pain, severe nausea/vomiting, or problems with urination or bowel movements unless otherwise stated above. Pertinent History Reviewed:  Reviewed past medical,surgical, social, obstetrical and family history.  Reviewed problem list, medications and allergies. Physical Assessment:   Vitals:   12/28/20 1549  BP:  103/71  Pulse: (!) 119  Weight: 98 lb 3.2 oz (44.5 kg)  Body mass index is 19.83 kg/m.           Physical Examination:   General appearance: alert, well appearing, and in no distress  Mental status: alert, oriented to person, place, and time  Skin: warm & dry   Extremities: Edema: Trace    Cardiovascular: normal heart rate noted  Respiratory: normal respiratory effort, no distress  Abdomen: gravid, soft, non-tender  Pelvic: Cervical exam deferred         Fetal Status:     Movement: Present   FHT by [place] see below  Fetal Surveillance Testing today: growth scan  CX 2.7 cm,normal ovaries BABY A: female,cephalic right,anterior placenta gr 0,SVP of fluid 6.8 cm,FHR 174 bpm,EFW 641 g 31%,discordance 1.9% BABY B: female,cephalic left,anterior placenta gr 0,SVP of fluid 4.2 cm,FHR 166 bpm,EFW 654 g 36%  Chaperone: N/A    Results for orders placed or performed in visit on 12/28/20 (from the past 24 hour(s))  POC Urinalysis Dipstick OB   Collection Time: 12/28/20  3:51 PM  Result Value Ref Range   Color, UA     Clarity, UA     Glucose, UA Negative Negative   Bilirubin, UA     Ketones, UA neg    Spec Grav, UA     Blood, UA neg    pH, UA     POC,PROTEIN,UA Negative Negative, Trace, Small (1+), Moderate (2+), Large (3+), 4+   Urobilinogen, UA  Nitrite, UA neg    Leukocytes, UA Negative Negative   Appearance     Odor       Assessment & Plan:  High-risk pregnancy: G1P0000 at [redacted]w[redacted]d with an Estimated Date of Delivery: 04/18/21   1) DC/DA twins- continue growth q 4wk  2) PN-2 lab work next visit  Meds: No orders of the defined types were placed in this encounter.   Labs/procedures today: growth scan  Treatment Plan:  OB care as outlined above  Reviewed: Preterm labor symptoms and general obstetric precautions including but not limited to vaginal bleeding, contractions, leaking of fluid and fetal movement were reviewed in detail with the patient.  All questions were answered.    Follow-up: Return in about 4 weeks (around 01/25/2021) for HROB visit, PN-2, growth every 4 wks.   No future appointments.   Orders Placed This Encounter  Procedures   POC Urinalysis Dipstick OB    Myna Hidalgo, DO Attending Obstetrician & Gynecologist, [facility] for [employer], [facility]

## 2021-01-06 ENCOUNTER — Emergency Department (HOSPITAL_COMMUNITY)
Admission: EM | Admit: 2021-01-06 | Discharge: 2021-01-06 | Discharge status: Against Medical Advice | Payer: BC Managed Care – PPO

## 2021-01-17 ENCOUNTER — Other Ambulatory Visit: Payer: Self-pay | Admitting: Obstetrics & Gynecology

## 2021-01-17 DIAGNOSIS — O30042 Twin pregnancy, dichorionic/diamniotic, second trimester: Secondary | ICD-10-CM

## 2021-01-23 ENCOUNTER — Other Ambulatory Visit: Payer: Self-pay

## 2021-01-23 ENCOUNTER — Encounter: Payer: Self-pay | Admitting: Obstetrics & Gynecology

## 2021-01-23 ENCOUNTER — Ambulatory Visit (INDEPENDENT_AMBULATORY_CARE_PROVIDER_SITE_OTHER): Payer: BC Managed Care – PPO | Admitting: Obstetrics & Gynecology

## 2021-01-23 ENCOUNTER — Other Ambulatory Visit: Payer: BC Managed Care – PPO

## 2021-01-23 ENCOUNTER — Ambulatory Visit (INDEPENDENT_AMBULATORY_CARE_PROVIDER_SITE_OTHER): Payer: BC Managed Care – PPO

## 2021-01-23 VITALS — BP 97/73 | HR 105 | Wt 99.5 lb

## 2021-01-23 DIAGNOSIS — O0992 Supervision of high risk pregnancy, unspecified, second trimester: Secondary | ICD-10-CM

## 2021-01-23 DIAGNOSIS — O30042 Twin pregnancy, dichorionic/diamniotic, second trimester: Secondary | ICD-10-CM

## 2021-01-23 DIAGNOSIS — Z3A27 27 weeks gestation of pregnancy: Secondary | ICD-10-CM

## 2021-01-23 MED ORDER — OMEPRAZOLE 20 MG PO CPDR: 20.0000 mg | DELAYED_RELEASE_CAPSULE | Freq: Every day | ORAL | 6 refills | Status: DC

## 2021-01-23 NOTE — Progress Notes (Signed)
HIGH-RISK PREGNANCY VISIT Patient name: Mary Singleton MRN [id number]  Date of birth: 07/17/1995 Chief Complaint:   High Risk Gestation ([place] today; left leg cramps)  History of Present Illness:   Mary Singleton is a 25 y.o. G1P0000 female at [redacted]w[redacted]d with an Estimated Date of Delivery: 04/18/21 being seen today for ongoing management of a high-risk pregnancy complicated by DCDA twins.    Today she reports no complaints. Contractions: Irritability. Vag. Bleeding: None.  Movement: Present. denies leaking of fluid.   Depression screen [facility] 2/9 10/11/2020 01/09/2017  Decreased Interest 0 0  Down, Depressed, Hopeless 0 0  PHQ - 2 Score 0 0  Altered sleeping 0 -  Tired, decreased energy 0 -  Change in appetite 0 -  Feeling bad or failure about yourself  0 -  Trouble concentrating 1 -  Moving slowly or fidgety/restless 0 -  Suicidal thoughts 0 -  PHQ-9 Score 1 -     GAD 7 : Generalized Anxiety Score 10/11/2020  Nervous, Anxious, on Edge 1  Control/stop worrying 0  Worry too much - different things 0  Trouble relaxing 0  Restless 0  Easily annoyed or irritable 1  Afraid - awful might happen 0  Total GAD 7 Score 2     Review of Systems:   Pertinent items are noted in HPI Denies abnormal vaginal discharge w/ itching/odor/irritation, headaches, visual changes, shortness of breath, chest pain, abdominal pain, severe nausea/vomiting, or problems with urination or bowel movements unless otherwise stated above. Pertinent History Reviewed:  Reviewed past medical,surgical, social, obstetrical and family history.  Reviewed problem list, medications and allergies. Physical Assessment:   Vitals:   01/23/21 1142  BP: 97/73  Pulse: (!) 105  Weight: 99 lb 8 oz (45.1 kg)  Body mass index is 20.1 kg/m.           Physical Examination:   General appearance: alert, well appearing, and in no distress  Mental status: alert, oriented to person, place, and time  Skin: warm & dry   Extremities:  Edema: Trace    Cardiovascular: normal heart rate noted  Respiratory: normal respiratory effort, no distress  Abdomen: gravid, soft, non-tender  Pelvic: Cervical exam deferred         Fetal Status:     Movement: Present    Fetal Surveillance Testing today: sonogram   Chaperone: N/A    No results found for this or any previous visit (from the past 24 hour(s)).  Assessment & Plan:  High-risk pregnancy: G1P0000 at [redacted]w[redacted]d with an Estimated Date of Delivery: 04/18/21   1) DCDA twins, sonogram looks good, no significant discordance    Meds: No orders of the defined types were placed in this encounter.   Labs/procedures today: U/S  Treatment Plan:  per routine for twins  Reviewed: Preterm labor symptoms and general obstetric precautions including but not limited to vaginal bleeding, contractions, leaking of fluid and fetal movement were reviewed in detail with the patient.  All questions were answered. Does have home bp cuff. Office bp cuff given: not applicable. Check bp weekly, let us know if consistently >140 and/or >90.  Follow-up: No follow-ups on file.   Future Appointments  Date Time Provider Department Center  01/30/2021  9:10 AM [facility]  02/17/2021 10:30 AM [facility] [place] [facility] None  02/17/2021 11:30 AM Myna Hidalgo, DO [facility]  03/20/2021 10:30 AM [facility] [place] [facility] None  03/20/2021 11:30 AM Cheral Marker, CNM  [facility]    No orders of the defined types were placed in this encounter.  Florian Buff  01/23/2021 11:56 AM

## 2021-01-23 NOTE — Progress Notes (Signed)
[place] 27+6 wks,DC/DA twins 27+6 wks,normal ovaries BABY A:female, cephalic right,anterior placenta gr 3,SVP of fluid 6.5 cm,fhr 166 BPM,EFW 1071 g 22.9%,discordance 8.2% BABY B: female,cephalic left,anterior placenta gr 3,SVP of fluid 4.6 cm,FHR 159 bpm,EFW 1166 g 44.5%

## 2021-01-24 ENCOUNTER — Encounter: Payer: Self-pay | Admitting: Women's Health

## 2021-01-30 ENCOUNTER — Encounter: Payer: Self-pay | Admitting: Obstetrics & Gynecology

## 2021-01-30 ENCOUNTER — Other Ambulatory Visit: Payer: BC Managed Care – PPO

## 2021-01-31 ENCOUNTER — Other Ambulatory Visit: Payer: Self-pay | Admitting: Women's Health

## 2021-01-31 LAB — CBC
Hematocrit: 29.2 % — ABNORMAL LOW (ref 34.0–46.6)
Hemoglobin: 9.4 g/dL — ABNORMAL LOW (ref 11.1–15.9)
MCH: 26 pg — ABNORMAL LOW (ref 26.6–33.0)
MCHC: 32.2 g/dL (ref 31.5–35.7)
MCV: 81 fL (ref 79–97)
Platelets: 244 10*3/uL (ref 150–450)
RBC: 3.62 x10E6/uL — ABNORMAL LOW (ref 3.77–5.28)
RDW: 13.7 % (ref 11.7–15.4)
WBC: 18.9 10*3/uL — ABNORMAL HIGH (ref 3.4–10.8)

## 2021-01-31 LAB — ANTIBODY SCREEN: Antibody Screen: NEGATIVE

## 2021-01-31 LAB — GLUCOSE TOLERANCE, 2 HOURS W/ 1HR
Glucose, 1 hour: 173 mg/dL (ref 70–179)
Glucose, 2 hour: 94 mg/dL (ref 70–152)
Glucose, Fasting: 84 mg/dL (ref 70–91)

## 2021-01-31 LAB — RPR: RPR Ser Ql: NONREACTIVE

## 2021-01-31 LAB — HIV ANTIBODY (ROUTINE TESTING W REFLEX): HIV Screen 4th Generation wRfx: NONREACTIVE

## 2021-01-31 MED ORDER — FERROUS SULFATE 325 (65 FE) MG PO TABS: 325.0000 mg | ORAL_TABLET | ORAL | 2 refills | Status: DC

## 2021-02-14 ENCOUNTER — Other Ambulatory Visit: Payer: Self-pay | Admitting: Obstetrics & Gynecology

## 2021-02-14 DIAGNOSIS — O30043 Twin pregnancy, dichorionic/diamniotic, third trimester: Secondary | ICD-10-CM

## 2021-02-17 ENCOUNTER — Ambulatory Visit (INDEPENDENT_AMBULATORY_CARE_PROVIDER_SITE_OTHER): Payer: Medicaid Other | Admitting: Obstetrics & Gynecology

## 2021-02-17 ENCOUNTER — Other Ambulatory Visit: Payer: Self-pay

## 2021-02-17 ENCOUNTER — Ambulatory Visit (INDEPENDENT_AMBULATORY_CARE_PROVIDER_SITE_OTHER): Payer: Medicaid Other

## 2021-02-17 ENCOUNTER — Encounter: Payer: Self-pay | Admitting: Obstetrics & Gynecology

## 2021-02-17 VITALS — BP 102/68 | HR 96 | Wt 104.0 lb

## 2021-02-17 DIAGNOSIS — Z3A31 31 weeks gestation of pregnancy: Secondary | ICD-10-CM

## 2021-02-17 DIAGNOSIS — O30043 Twin pregnancy, dichorionic/diamniotic, third trimester: Secondary | ICD-10-CM | POA: Diagnosis not present

## 2021-02-17 DIAGNOSIS — O0993 Supervision of high risk pregnancy, unspecified, third trimester: Secondary | ICD-10-CM

## 2021-02-17 DIAGNOSIS — O0992 Supervision of high risk pregnancy, unspecified, second trimester: Secondary | ICD-10-CM

## 2021-02-17 LAB — POCT URINALYSIS DIPSTICK OB
Blood, UA: NEGATIVE
Glucose, UA: NEGATIVE
Ketones, UA: NEGATIVE
Nitrite, UA: NEGATIVE
POC,PROTEIN,UA: NEGATIVE

## 2021-02-17 NOTE — Progress Notes (Signed)
[place] 31+3 wks,DC/DA twins BABY A:female,cephalic right inferior,anterior placenta gr 3,FHR 152 bpm,SVP of fluid 5.7 CM,EFW 1619 g 18%,discordance 7.5% BABY B: female,cephalic left superior,anterior placenta gr 3,FHR 159 bpm,SVP of fluid 4.6 CM,EFW 1750 g 36%

## 2021-02-17 NOTE — Progress Notes (Signed)
HIGH-RISK PREGNANCY VISIT Patient name: Mary Singleton MRN [id number]  Date of birth: 1996/03/03 Chief Complaint:   High Risk Gestation ([place] today)  History of Present Illness:   Mary Singleton is a 25 y.o. G1P0000 female at [redacted]w[redacted]d with an Estimated Date of Delivery: 04/18/21 being seen today for ongoing management of a high-risk pregnancy complicated by DC/DA twins.    Today she reports occasional contractions.   Contractions: Irritability. Vag. Bleeding: None.  Movement: Present. denies leaking of fluid.   Depression screen [facility] 2/9 10/11/2020 01/09/2017  Decreased Interest 0 0  Down, Depressed, Hopeless 0 0  PHQ - 2 Score 0 0  Altered sleeping 0 -  Tired, decreased energy 0 -  Change in appetite 0 -  Feeling bad or failure about yourself  0 -  Trouble concentrating 1 -  Moving slowly or fidgety/restless 0 -  Suicidal thoughts 0 -  PHQ-9 Score 1 -     Current Outpatient Medications  Medication Instructions   acyclovir (ZOVIRAX) 400 mg, Oral, 3 times daily   aspirin 81 mg, Oral, Daily, Swallow whole.   Blood Pressure Monitor MISC For regular home bp monitoring during pregnancy   Doxylamine-Pyridoxine (DICLEGIS) 10-10 MG TBEC 2 tabs q hs, if sx persist add 1 tab q am on day 3, if sx persist add 1 tab q afternoon on day 4   ferrous sulfate 325 mg, Oral, Every other day   omeprazole (PRILOSEC) 20 mg, Oral, Daily, 1 tablet a day   Prenatal Vit-Fe Fumarate-FA (MULTIVITAMIN-PRENATAL) 27-0.8 MG TABS tablet 1 tablet, Oral, Daily     Review of Systems:   Pertinent items are noted in HPI Denies abnormal vaginal discharge w/ itching/odor/irritation, headaches, visual changes, shortness of breath, chest pain, abdominal pain, severe nausea/vomiting, or problems with urination or bowel movements unless otherwise stated above. Pertinent History Reviewed:  Reviewed past medical,surgical, social, obstetrical and family history.  Reviewed problem list, medications and allergies. Physical  Assessment:   Vitals:   02/17/21 1134  BP: 102/68  Pulse: 96  Weight: 104 lb (47.2 kg)  Body mass index is 21.01 kg/m.           Physical Examination:   General appearance: alert, well appearing, and in no distress  Mental status: normal mood, behavior, speech, dress, motor activity, and thought processes  Skin: warm & dry   Extremities: Edema: Trace    Cardiovascular: normal heart rate noted  Respiratory: normal respiratory effort, no distress  Abdomen: gravid, soft, non-tender  Pelvic: Cervical exam deferred         Fetal Status:     Movement: Present  x2  Growth scan today as below  Fetal Surveillance Testing today: Growth- BABY A:female,cephalic right inferior,anterior placenta gr 3,FHR 152 bpm,SVP of fluid 5.7 CM,EFW 1619 g 18%,discordance 7.5% BABY B: female,cephalic left superior,anterior placenta gr 3,FHR 159 bpm,SVP of fluid 4.6 CM,EFW 1750 g 36%   Chaperone: N/A    Results for orders placed or performed in visit on 02/17/21 (from the past 24 hour(s))  POC Urinalysis Dipstick OB   Collection Time: 02/17/21 11:36 AM  Result Value Ref Range   Color, UA     Clarity, UA     Glucose, UA Negative Negative   Bilirubin, UA     Ketones, UA neg    Spec Grav, UA     Blood, UA neg    pH, UA     POC,PROTEIN,UA Negative Negative, Trace, Small (1+), Moderate (2+), Large (3+), 4+  Urobilinogen, UA     Nitrite, UA neg    Leukocytes, UA Trace (A) Negative   Appearance     Odor       Assessment & Plan:  High-risk pregnancy: G1P0000 at [redacted]w[redacted]d with an Estimated Date of Delivery: 04/18/21   1) DC/DA twins -continue growth q 4wks -@ 36wks BPP weekly   Meds: No orders of the defined types were placed in this encounter.   Labs/procedures today: growth scan  Treatment Plan:  continue routine OB care and as outlined above  Reviewed: Preterm labor symptoms and general obstetric precautions including but not limited to vaginal bleeding, contractions, leaking of fluid and fetal  movement were reviewed in detail with the patient.  All questions were answered.   Follow-up: Return in about 2 weeks (around 03/03/2021) for [place] visit, in 5wks will need BPP weekly.   Future Appointments  Date Time Provider [place]  03/20/2021 10:30 AM [facility] [place] [facility] None  03/20/2021 11:30 AM Roma Schanz, CNM [facility]    Orders Placed This Encounter  Procedures   POC Urinalysis Dipstick OB    Janyth Pupa, DO Attending [place], [facility] for [employer], [place]

## 2021-02-22 ENCOUNTER — Encounter: Payer: Self-pay | Admitting: Obstetrics & Gynecology

## 2021-02-22 ENCOUNTER — Encounter: Payer: Self-pay | Admitting: Women's Health

## 2021-02-22 ENCOUNTER — Ambulatory Visit (INDEPENDENT_AMBULATORY_CARE_PROVIDER_SITE_OTHER): Payer: BC Managed Care – PPO | Admitting: Obstetrics & Gynecology

## 2021-02-22 ENCOUNTER — Encounter (HOSPITAL_COMMUNITY): Payer: Self-pay | Admitting: Obstetrics and Gynecology

## 2021-02-22 ENCOUNTER — Observation Stay (HOSPITAL_COMMUNITY)
Admission: AD | Admit: 2021-02-22 | Discharge: 2021-02-24 | Disposition: A | Discharge status: Home / Self Care | Payer: BC Managed Care – PPO | Attending: Obstetrics and Gynecology | Admitting: Obstetrics and Gynecology

## 2021-02-22 ENCOUNTER — Other Ambulatory Visit: Payer: Self-pay

## 2021-02-22 VITALS — BP 100/69 | HR 98 | Wt 105.4 lb

## 2021-02-22 DIAGNOSIS — O30043 Twin pregnancy, dichorionic/diamniotic, third trimester: Secondary | ICD-10-CM

## 2021-02-22 DIAGNOSIS — Z20822 Contact with and (suspected) exposure to covid-19: Secondary | ICD-10-CM | POA: Diagnosis not present

## 2021-02-22 DIAGNOSIS — Z3A32 32 weeks gestation of pregnancy: Secondary | ICD-10-CM | POA: Insufficient documentation

## 2021-02-22 DIAGNOSIS — O4700 False labor before 37 completed weeks of gestation, unspecified trimester: Secondary | ICD-10-CM | POA: Diagnosis not present

## 2021-02-22 DIAGNOSIS — O0993 Supervision of high risk pregnancy, unspecified, third trimester: Secondary | ICD-10-CM

## 2021-02-22 DIAGNOSIS — Z7982 Long term (current) use of aspirin: Secondary | ICD-10-CM | POA: Insufficient documentation

## 2021-02-22 LAB — RESP PANEL BY RT-PCR (FLU A&B, COVID) ARPGX2
Influenza A by PCR: NEGATIVE
Influenza B by PCR: NEGATIVE
SARS Coronavirus 2 by RT PCR: NEGATIVE

## 2021-02-22 LAB — CBC
HCT: 35.4 % — ABNORMAL LOW (ref 36.0–46.0)
Hemoglobin: 11.1 g/dL — ABNORMAL LOW (ref 12.0–15.0)
MCH: 27.1 pg (ref 26.0–34.0)
MCHC: 31.4 g/dL (ref 30.0–36.0)
MCV: 86.6 fL (ref 80.0–100.0)
Platelets: 238 10*3/uL (ref 150–400)
RBC: 4.09 MIL/uL (ref 3.87–5.11)
RDW: 21.1 % — ABNORMAL HIGH (ref 11.5–15.5)
WBC: 19.5 10*3/uL — ABNORMAL HIGH (ref 4.0–10.5)
nRBC: 0 % (ref 0.0–0.2)

## 2021-02-22 LAB — TYPE AND SCREEN
ABO/RH(D): B POS
Antibody Screen: NEGATIVE

## 2021-02-22 MED ORDER — MAGNESIUM SULFATE BOLUS VIA INFUSION
4.0000 g | Freq: Once | INTRAVENOUS | Status: AC
  Administered 2021-02-22: 18:00:00 4 g via INTRAVENOUS
  Filled 2021-02-22: qty 1000

## 2021-02-22 MED ORDER — ACETAMINOPHEN 325 MG PO TABS: 650.0000 mg | ORAL_TABLET | ORAL | Status: DC | PRN

## 2021-02-22 MED ORDER — TERBUTALINE SULFATE 1 MG/ML IJ SOLN
0.2500 mg | Freq: Once | INTRAMUSCULAR | Status: AC
  Administered 2021-02-22: 23:00:00 0.25 mg via SUBCUTANEOUS
  Filled 2021-02-22: qty 1

## 2021-02-22 MED ORDER — LACTATED RINGERS IV BOLUS
500.0000 mL | Freq: Once | INTRAVENOUS | Status: AC
  Administered 2021-02-22: 500 mL via INTRAVENOUS

## 2021-02-22 MED ORDER — TERBUTALINE SULFATE 1 MG/ML IJ SOLN
0.2500 mg | Freq: Once | INTRAMUSCULAR | Status: AC
  Administered 2021-02-22: 17:00:00 0.25 mg via SUBCUTANEOUS
  Filled 2021-02-22: qty 1

## 2021-02-22 MED ORDER — BETAMETHASONE SOD PHOS & ACET 6 (3-3) MG/ML IJ SUSP
12.0000 mg | INTRAMUSCULAR | Status: AC
  Administered 2021-02-22 – 2021-02-23 (×2): 12 mg via INTRAMUSCULAR
  Filled 2021-02-22: qty 5

## 2021-02-22 MED ORDER — ZOLPIDEM TARTRATE 5 MG PO TABS
5.0000 mg | ORAL_TABLET | Freq: Every evening | ORAL | Status: DC | PRN
  Administered 2021-02-23: 02:00:00 5 mg via ORAL
  Filled 2021-02-22: qty 1

## 2021-02-22 MED ORDER — LACTATED RINGERS IV BOLUS
1000.0000 mL | Freq: Once | INTRAVENOUS | Status: AC
  Administered 2021-02-22: 17:00:00 1000 mL via INTRAVENOUS

## 2021-02-22 MED ORDER — CALCIUM CARBONATE ANTACID 500 MG PO CHEW: 2.0000 | CHEWABLE_TABLET | ORAL | Status: DC | PRN

## 2021-02-22 MED ORDER — MAGNESIUM SULFATE 40 GM/1000ML IV SOLN
1.0000 g/h | INTRAVENOUS | Status: DC
  Administered 2021-02-22: 19:00:00 2 g/h via INTRAVENOUS
  Administered 2021-02-23: 17:00:00 1 g/h via INTRAVENOUS
  Filled 2021-02-22 (×2): qty 1000

## 2021-02-22 MED ORDER — PRENATAL MULTIVITAMIN CH: 1.0000 | ORAL_TABLET | Freq: Every day | ORAL | Status: DC

## 2021-02-22 MED ORDER — ONDANSETRON 4 MG PO TBDP
4.0000 mg | ORAL_TABLET | Freq: Three times a day (TID) | ORAL | Status: DC | PRN
  Administered 2021-02-22 – 2021-02-23 (×2): 4 mg via ORAL
  Filled 2021-02-22 (×2): qty 1

## 2021-02-22 MED ORDER — LACTATED RINGERS IV SOLN: INTRAVENOUS | Status: DC

## 2021-02-22 MED ORDER — DOCUSATE SODIUM 100 MG PO CAPS
100.0000 mg | ORAL_CAPSULE | Freq: Every day | ORAL | Status: DC
  Administered 2021-02-23: 15:00:00 100 mg via ORAL
  Filled 2021-02-22 (×3): qty 1

## 2021-02-22 NOTE — MAU Provider Note (Signed)
History     CSN: [id number]  Arrival date and time: 02/22/21 1605   Event Date/Time   First Provider Initiated Contact with Patient 02/22/21 1622      Chief Complaint  Patient presents with   Contractions   HPI Mary Singleton is a 25 y.o. G1P0000 at 23w1dwith di/di twins [place] [facility] [place]. Patient endorses recurrent painful contractions since last night. [facility]. Patient states her contractions are much worse than at onset, but unchanged since she was evaluated in the office. Pain score on arrival to MAU is 7-8/10. She denies vaginal bleeding, leaking of fluid, decreased fetal movement, fever, falls, or recent illness.   OB History     Gravida  1   Para  0   Term  0   Preterm  0   AB  0   Living  0      SAB  0   IAB  0   Ectopic  0   Multiple  1   Live Births  0           History reviewed. No pertinent past medical history.  Past Surgical History:  Procedure Laterality Date   BIOPSY  08/16/2017   Procedure: BIOPSY;  Surgeon: RRogene Houston MD;  Location: [facility];  Service: Endoscopy;;  duodenum   ESOPHAGOGASTRODUODENOSCOPY (EGD) WITH PROPOFOL N/A 08/16/2017   Procedure: ESOPHAGOGASTRODUODENOSCOPY (EGD) WITH PROPOFOL;  Surgeon: RRogene Houston MD;  Location: [facility];  Service: Endoscopy;  Laterality: N/A;  8:30   EYE SURGERY      Family History  Problem Relation Age of Onset   Healthy Mother    Cancer Maternal Grandmother     Social History   Tobacco Use   Smoking status: Never   Smokeless tobacco: Never  Vaping Use   Vaping Use: Never used  Substance Use Topics   Alcohol use: No   Drug use: No    Allergies:  Allergies  Allergen Reactions   Meloxicam Rash    Medications Prior to Admission  Medication Sig Dispense Refill Last Dose   acyclovir (ZOVIRAX) 400 MG tablet Take 1  tablet (400 mg total) by mouth 3 (three) times daily. 90 tablet 3 Past Month   aspirin 81 MG EC tablet Take 1 tablet (81 mg total) by mouth daily. Swallow whole. 90 tablet 3 02/21/2021   ferrous sulfate 325 (65 FE) MG tablet Take 1 tablet (325 mg total) by mouth every other day. 45 tablet 2 02/21/2021   omeprazole (PRILOSEC) 20 MG capsule Take 1 capsule (20 mg total) by mouth daily. 1 tablet a day 30 capsule 6 Past Month   Prenatal Vit-Fe Fumarate-FA (MULTIVITAMIN-PRENATAL) 27-0.8 MG TABS tablet Take 1 tablet by mouth daily at 12 noon.   02/21/2021   Blood Pressure Monitor MISC For regular home bp monitoring during pregnancy 1 each 0    Doxylamine-Pyridoxine (DICLEGIS) 10-10 MG TBEC 2 tabs q hs, if sx persist add 1 tab q am on day 3, if sx persist add 1 tab q afternoon on day 4 (Patient not taking: Reported on 02/22/2021) 100 tablet 6     Review of Systems  Gastrointestinal:  Positive for abdominal pain.  All other systems reviewed and are negative. Physical Exam   Last menstrual period 07/19/2020.  Physical Exam Vitals and nursing note reviewed. Exam conducted  with a chaperone present.  Constitutional:      Appearance: Normal appearance.  Cardiovascular:     Rate and Rhythm: Normal rate.     Pulses: Normal pulses.  Pulmonary:     Effort: Pulmonary effort is normal.  Abdominal:     Comments: Gravid  Skin:    Capillary Refill: Capillary refill takes less than 2 seconds.  Neurological:     Mental Status: She is alert.    MAU Course  Procedures  --Report received from Dr. Nelda Marseille prior to patient arrival. 2-3cm in office. FFN collected --Patient met on arrival. Remains 2-3cm.  --[place]oco:Recurrent contractions q 3 min. Patient wincing during contractions --Baby A: reactive tracing: baseline 150, mod car, + accels, no decels --Baby B: reactive tracing: baseline 145, mod var, + accels, no decels --Discussed with Dr. Elly Modena, who advises admission to L&D for preterm labor --Admission  coordinated with Dr. Higinio Roger and NICU Charge RN  Patient Vitals for the past 24 hrs:  BP Temp Temp src Pulse Resp SpO2  02/22/21 1632 116/76 98.1 F (36.7 C) Oral (!) 101 16 100 %   Orders Placed This Encounter  Procedures   Resp Panel by RT-PCR (Flu A&B, Covid) Nasopharyngeal Swab   CBC   RPR   Type and screen [place]   Blood draw with IV start   Insert and maintain IV Line   Admit to Inpatient (patient's expected length of stay will be greater than 2 midnights or inpatient only procedure)   Meds ordered this encounter  Medications   lactated ringers bolus 1,000 mL   terbutaline (BRETHINE) injection 0.25 mg   betamethasone acetate-betamethasone sodium phosphate (CELESTONE) injection 12 mg   Assessment and Plan  --25 y.o. G1P0000 at 49w1dwith di/di twins --Preterm labor: 2-3cm --Reactive tracing x 2 --Admit to L&D, report given to Dr. ALily Peer CNM 02/22/2021, 5:30 PM

## 2021-02-22 NOTE — H&P (Signed)
OBSTETRIC ADMISSION HISTORY AND PHYSICAL  Mary Singleton is a 25 y.o. female G1P0000 with IUP at [redacted]w[redacted]d by 9 week [place] presenting for preterm contractions. She reports +FMs, no LOF, no VB, no blurry vision, headaches, peripheral edema, or RUQ pain.  She continues to report the contractions as painful but they have spaced since starting treatments in the MAU. She is reporting some nausea. She has no other concerns at this time.   She received her prenatal care at [facility].  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 No pertinent past medical history.  Past Surgical History: Past Surgical History:  Procedure Laterality Date   BIOPSY  08/16/2017   Procedure: BIOPSY;  Surgeon: 08/18/2017, MD;  [facility]: [facility];  Service: Endoscopy;;  duodenum   ESOPHAGOGASTRODUODENOSCOPY (EGD) WITH PROPOFOL N/A 08/16/2017   Procedure: ESOPHAGOGASTRODUODENOSCOPY (EGD) WITH PROPOFOL;  Surgeon: 08/18/2017, MD;  Location: [facility];  Service: Endoscopy;  Laterality: N/A;  8:30   EYE SURGERY      Obstetrical History: OB History     Gravida  1   Para  0   Term  0   Preterm  0   AB  0   Living  0      SAB  0   IAB  0   Ectopic  0   Multiple  1   Live Births  0           Social History Social History   Socioeconomic History   Marital status: Single    Spouse name: Not on file   Number of children: 0   Years of education: Not on file   Highest education level: Not on file  Occupational History   Not on file  Tobacco Use   Smoking status: Never   Smokeless tobacco: Never  Vaping Use   Vaping Use: Never used  Substance and Sexual Activity   Alcohol use:  No   Drug use: No   Sexual activity: Not Currently    Birth control/protection: None  Other Topics Concern   Not on file  Social History Narrative   Not on file   Social Determinants of Health   Financial Resource Strain: Low Risk    Difficulty of Paying Living Expenses: Not hard at all  Food Insecurity: No Food Insecurity   Worried About Mary Singleton in the Last Year: Never true   Ran Out of Food in the Last Year: Never true  Transportation Needs: No Transportation Needs   Lack of Transportation (Medical): No   Lack of Transportation (Non-Medical): No  Physical Activity: Insufficiently Active   Days of Exercise per Week: 3 days   Minutes of Exercise per Session: 10 min  Stress: No Stress Concern Present   Feeling of Stress : Not at all  Social Connections: Moderately Isolated   Frequency of Communication with Friends and Family: More than three times a week   Frequency of Social Gatherings with Friends and Family: More than three times a week   Attends Religious Services: More than 4 times per year   Active Member of Programme researcher, broadcasting/film/video or Organizations: No   Attends [employer] Meetings: Never  Marital Status: Never married    Family History: Family History  Problem Relation Age of Onset   Healthy Mother    Cancer Maternal Grandmother     Allergies: Allergies  Allergen Reactions   Meloxicam Rash    Medications Prior to Admission  Medication Sig Dispense Refill Last Dose   acyclovir (ZOVIRAX) 400 MG tablet Take 1 tablet (400 mg total) by mouth 3 (three) times daily. 90 tablet 3 Past Month   aspirin 81 MG EC tablet Take 1 tablet (81 mg total) by mouth daily. Swallow whole. 90 tablet 3 02/21/2021   ferrous sulfate 325 (65 FE) MG tablet Take 1 tablet (325 mg total) by mouth every other day. 45 tablet 2 02/21/2021   omeprazole (PRILOSEC) 20 MG capsule Take 1 capsule (20 mg total) by mouth daily. 1 tablet a day 30 capsule 6 Past Month   Prenatal Vit-Fe Fumarate-FA  (MULTIVITAMIN-PRENATAL) 27-0.8 MG TABS tablet Take 1 tablet by mouth daily at 12 noon.   02/21/2021   Blood Pressure Monitor MISC For regular home bp monitoring during pregnancy 1 each 0    Doxylamine-Pyridoxine (DICLEGIS) 10-10 MG TBEC 2 tabs q hs, if sx persist add 1 tab q am on day 3, if sx persist add 1 tab q afternoon on day 4 (Patient not taking: Reported on 02/22/2021) 100 tablet 6      Review of Systems  All systems reviewed and negative except as stated in HPI  Blood pressure 130/73, pulse (!) 107, temperature 98.1 F (36.7 C), temperature source Oral, resp. rate 16, last menstrual period 07/19/2020, SpO2 100 %.  General appearance: alert, cooperative, and no distress Lungs: normal work of breathing on room air  Heart: normal rate, warm and well perfused  Abdomen: soft, non-tender, gravid  Pelvic: normal external genitalia, normal vaginal mucosa and cervix, no lesions noted  Extremities: no LE edema or calf tenderness to palpation   Presentation:  Cephalic/Cephalic  Fetal monitoring:  Twin A - Baseline 160 bpm, moderate variability, + accels, no decels  Twin B - Baseline 150 bpm, moderate variability, + accels, no decels   Uterine activity: Contractions every 3 minutes on arrival, now every 6-10 minutes  Dilation: 2.5 Exam by:: Mary Singleton, CNM   Prenatal labs: ABO, Rh: --/--/PENDING (12/21 1710) Antibody: PENDING (12/21 1710) Rubella: 1.28 (08/09 1543) RPR: Non Reactive (11/28 0811)  HBsAg: Negative (08/09 1543)  HIV: Non Reactive (11/28 0811)  GBS:  Unknown  2 hr Glucola normal  Genetic screening normal  Anatomy US normal   Prenatal Transfer Tool  Maternal Diabetes: No Genetic Screening: Normal Maternal Ultrasounds/Referrals: Normal Fetal Ultrasounds or other Referrals:  None Maternal Substance Abuse:  No Significant Maternal Medications:  None Significant Maternal Lab Results: None  Results for orders placed or performed during the hospital encounter  of 02/22/21 (from the past 24 hour(s))  CBC   Collection Time: 02/22/21  5:10 PM  Result Value Ref Range   WBC 19.5 (H) 4.0 - 10.5 K/uL   RBC 4.09 3.87 - 5.11 MIL/uL   Hemoglobin 11.1 (L) 12.0 - 15.0 g/dL   HCT 35.4 (L) 36.0 - 46.0 %   MCV 86.6 80.0 - 100.0 fL   MCH 27.1 26.0 - 34.0 pg   MCHC 31.4 30.0 - 36.0 g/dL   RDW 21.1 (H) 11.5 - 15.5 %   Platelets 238 150 - 400 K/uL   nRBC 0.0 0.0 - 0.2 %  Type and screen [place]   Collection Time:  02/22/21  5:10 PM  Result Value Ref Range   ABO/RH(D) PENDING    Antibody Screen PENDING    Sample Expiration      02/25/2021,2359 Performed at [facility], [place] [address], [place], [place] [address]     Patient Active Problem List   Diagnosis Date Noted   Indication for care in labor or delivery 02/22/2021   Herpes infection 11/03/2020   Supervision of high-risk pregnancy 10/06/2020   Dichorionic diamniotic twin pregnancy 10/06/2020   Dizzy spells 07/17/2018   Gastric outlet obstruction 03/22/2017    Assessment/Plan:  Mary Singleton is a 25 y.o. G1P0000 at [redacted]w[redacted]d with di/di twin gestation here for observation due to preterm contractions.   Received BMZ and terbutaline in MAU. Patient continued to have contractions and was started on magnesium. Contractions now every 6-10 minutes. Less painful. Will admit to [facility] for observation overnight.   #Pain: PRN #FWB: Cat 1  #ID: GBS unknown   #DVT prophylaxis: SCDs  Mary Del, MD  02/22/2021, 6:11 PM

## 2021-02-22 NOTE — Progress Notes (Signed)
HIGH-RISK PREGNANCY VISIT Patient name: Mary Singleton MRN [id number]  Date of birth: Jan 06, 1996 Chief Complaint:   work in ob ? contractions  History of Present Illness:   Mary Singleton is a 25 y.o. G1P0000 female at [redacted]w[redacted]d with an Estimated Date of Delivery: 04/18/21 being seen today for ongoing management of a high-risk pregnancy complicated by di/di twins.    Today she reports cramping since last night- seems to have gotten stronger this am.  She is not sure how frequent.  Rates her pain 7/10.  No LOF, no vaginal bleeding +FM  Contractions: Irritability. Vag. Bleeding: None.  Movement: Present. denies leaking of fluid.   Depression screen [facility] 2/9 10/11/2020 01/09/2017  Decreased Interest 0 0  Down, Depressed, Hopeless 0 0  PHQ - 2 Score 0 0  Altered sleeping 0 -  Tired, decreased energy 0 -  Change in appetite 0 -  Feeling bad or failure about yourself  0 -  Trouble concentrating 1 -  Moving slowly or fidgety/restless 0 -  Suicidal thoughts 0 -  PHQ-9 Score 1 -     Current Outpatient Medications  Medication Instructions   acyclovir (ZOVIRAX) 400 mg, Oral, 3 times daily   aspirin 81 mg, Oral, Daily, Swallow whole.   Blood Pressure Monitor MISC For regular home bp monitoring during pregnancy   Doxylamine-Pyridoxine (DICLEGIS) 10-10 MG TBEC 2 tabs q hs, if sx persist add 1 tab q am on day 3, if sx persist add 1 tab q afternoon on day 4   ferrous sulfate 325 mg, Oral, Every other day   omeprazole (PRILOSEC) 20 mg, Oral, Daily, 1 tablet a day   Prenatal Vit-Fe Fumarate-FA (MULTIVITAMIN-PRENATAL) 27-0.8 MG TABS tablet 1 tablet, Oral, Daily     Review of Systems:   Pertinent items are noted in HPI Denies abnormal vaginal discharge w/ itching/odor/irritation, headaches, visual changes, shortness of breath, chest pain, abdominal pain, severe nausea/vomiting, or problems with urination or bowel movements unless otherwise stated above. Pertinent History Reviewed:  Reviewed past  medical,surgical, social, obstetrical and family history.  Reviewed problem list, medications and allergies. Physical Assessment:   Vitals:   02/22/21 1433  BP: 100/69  Pulse: 98  Weight: 105 lb 6.4 oz (47.8 kg)  Body mass index is 21.29 kg/m.           Physical Examination:   General appearance: alert, well appearing, and in no distress  Mental status: normal mood, behavior, speech, dress, motor activity, and thought processes  Skin: warm & dry   Extremities: Edema: Trace    Cardiovascular: normal heart rate noted  Respiratory: normal respiratory effort, no distress  Abdomen: gravid, soft, non-tender  Pelvic: Cervical exam performed  Dilation: 2.5 Effacement (%): 50 Station: -2 FFN obtained.  SSE: attempted- unable to visualized cervix due to discomfort. Fetal Status:     Movement: Present Presentation: Vertex  Fetal Surveillance Testing today: Twin A; 140, moderate variability, +accels, no decels Twin B: 150, moderate variability, +accels, no decels  Toco: q3-30min contractions noted  Chaperone: Latisha Cresenzo    No results found for this or any previous visit (from the past 24 hour(s)).   Assessment & Plan:  High-risk pregnancy: G1P0000 at [redacted]w[redacted]d with an Estimated Date of Delivery: 04/18/21   1) Preterm labor -FFN pending -pt sent to [facility] for further evaluation and monitoring   Meds: No orders of the defined types were placed in this encounter.   Labs/procedures today: NST  Treatment Plan:  sent to  [facility] for next step  Follow-up: Return for appt scheduled 12/30.   Future Appointments  Date Time Provider Department Center  03/03/2021 10:50 AM Myna Hidalgo, DO [facility]  03/20/2021 10:30 AM [facility] [place] [facility] None  03/20/2021 11:30 AM Cheral Marker, CNM [facility]    Orders Placed This Encounter  Procedures   Fetal fibronectin    Myna Hidalgo, DO Attending Obstetrician & Gynecologist, Faculty Practice Center for [employer], [facility]

## 2021-02-22 NOTE — MAU Note (Signed)
...  Mary Singleton is a 25 y.o. at [redacted]w[redacted]d here in MAU reporting: CTX since yesterday. She states they are currently 5-10 minutes apart and she rates them 8/10. +FM. No VB or LOF.   Pain score:  8/10 abdomen

## 2021-02-23 ENCOUNTER — Encounter: Payer: Self-pay | Admitting: Women's Health

## 2021-02-23 DIAGNOSIS — O4700 False labor before 37 completed weeks of gestation, unspecified trimester: Secondary | ICD-10-CM

## 2021-02-23 LAB — MAGNESIUM: Magnesium: 6.6 mg/dL (ref 1.7–2.4)

## 2021-02-23 LAB — RPR: RPR Ser Ql: NONREACTIVE

## 2021-02-23 LAB — FETAL FIBRONECTIN: Fetal Fibronectin: NEGATIVE

## 2021-02-23 MED ORDER — SODIUM CHLORIDE 0.9 % IV SOLN
25.0000 mg | Freq: Once | INTRAVENOUS | Status: AC
  Administered 2021-02-23: 19:00:00 25 mg via INTRAVENOUS
  Filled 2021-02-23: qty 1

## 2021-02-23 MED ORDER — ACYCLOVIR 200 MG PO CAPS
400.0000 mg | ORAL_CAPSULE | Freq: Three times a day (TID) | ORAL | Status: DC
  Administered 2021-02-23 – 2021-02-24 (×2): 400 mg via ORAL
  Filled 2021-02-23 (×4): qty 2

## 2021-02-23 MED ORDER — COMPLETENATE 29-1 MG PO CHEW
1.0000 | CHEWABLE_TABLET | Freq: Every day | ORAL | Status: DC
  Administered 2021-02-23: 15:00:00 1 via ORAL
  Filled 2021-02-23: qty 1

## 2021-02-23 MED ORDER — NIFEDIPINE ER OSMOTIC RELEASE 30 MG PO TB24
30.0000 mg | ORAL_TABLET | Freq: Every day | ORAL | Status: DC
  Administered 2021-02-23: 15:00:00 30 mg via ORAL
  Filled 2021-02-23 (×2): qty 1

## 2021-02-23 NOTE — Progress Notes (Signed)
Patient ID: Mary Singleton, female   DOB: Feb 07, 1996, 25 y.o.   MRN: [id number] FACULTY PRACTICE ANTEPARTUM(COMPREHENSIVE) NOTE  Mary Singleton is a 25 y.o. G1P0000 at [redacted]w[redacted]d  who is admitted for Preterm labor with di-di twin pregnancy  Fetal presentation is cephalic/cephalic. Length of Stay:  1  Days  Date of admission:02/22/2021  Subjective: Patient reports improvement in her contractions. They are less intense and less frequent. She reports good fetal movement. She denies vaginal bleeding or leakage of fluid   Vitals:  Blood pressure 115/72, pulse (!) 109, temperature 97.6 F (36.4 C), temperature source Oral, resp. rate 18, last menstrual period 07/19/2020, SpO2 100 %. Vitals:   02/23/21 0740 02/23/21 0745 02/23/21 0750 02/23/21 0801  BP:    115/72  Pulse:    (!) 109  Resp:    18  Temp:    97.6 F (36.4 C)  TempSrc:    Oral  SpO2: 99% 100% 100% 100%   Physical Examination: GENERAL: Well-developed, well-nourished female in no acute distress.  LUNGS: Clear to auscultation bilaterally.  HEART: Regular rate and rhythm. ABDOMEN: Soft, nontender, gravid PELVIC: Not performed EXTREMITIES: No cyanosis, clubbing, or edema, 2+ distal pulses.   Fetal Monitoring:  Baseline: 135 and 140 bpm, Variability: Good {> 6 bpm), Accelerations: Reactive, and Decelerations: Absent   reactive x 2  Labs:  Results for orders placed or performed during the hospital encounter of 02/22/21 (from the past 24 hour(s))  Resp Panel by RT-PCR (Flu A&B, Covid) Nasopharyngeal Swab   Collection Time: 02/22/21  4:32 PM   Specimen: Nasopharyngeal Swab; Nasopharyngeal(NP) swabs in vial transport medium  Result Value Ref Range   SARS Coronavirus 2 by RT PCR NEGATIVE NEGATIVE   Influenza A by PCR NEGATIVE NEGATIVE   Influenza B by PCR NEGATIVE NEGATIVE  CBC   Collection Time: 02/22/21  5:10 PM  Result Value Ref Range   WBC 19.5 (H) 4.0 - 10.5 K/uL   RBC 4.09 3.87 - 5.11 MIL/uL   Hemoglobin 11.1 (L) 12.0 -  15.0 g/dL   HCT 54.9 (L) 82.6 - 41.5 %   MCV 86.6 80.0 - 100.0 fL   MCH 27.1 26.0 - 34.0 pg   MCHC 31.4 30.0 - 36.0 g/dL   RDW 83.0 (H) 94.0 - 76.8 %   Platelets 238 150 - 400 K/uL   nRBC 0.0 0.0 - 0.2 %  Type and screen [facility]   Collection Time: 02/22/21  5:10 PM  Result Value Ref Range   ABO/RH(D) B POS    Antibody Screen NEG    Sample Expiration      02/25/2021,2359 Performed at [facility], [address], [place], [place] [address]   Magnesium   Collection Time: 02/23/21  5:25 AM  Result Value Ref Range   Magnesium 6.6 (HH) 1.7 - 2.4 mg/dL    Imaging Studies:    No results found.   Medications:  Scheduled  betamethasone acetate-betamethasone sodium phosphate  12 mg Intramuscular Q24 Hr x 2   docusate sodium  100 mg Oral Daily   prenatal multivitamin  1 tablet Oral Q1200   I have reviewed the patient's current medications.  ASSESSMENT:  Patient Active Problem List   Diagnosis Date Noted   Indication for care in labor or delivery 02/22/2021   Preterm contractions 02/22/2021   Herpes infection 11/03/2020   Supervision of high-risk pregnancy 10/06/2020   Dichorionic diamniotic twin pregnancy 10/06/2020   Dizzy spells 07/17/2018  Gastric outlet obstruction 03/22/2017    PLAN: 25 yo G1P0 at [redacted]w[redacted]d with Di-Di twins admitted with preterm labor  - Will continue inpatient monitoring - Will continue magnesium sulfate for tocolysis until second dose of betamethasone this evening - Maternal fetal status remain stable  Kallan Singleton 02/23/2021,8:32 AM

## 2021-02-23 NOTE — Progress Notes (Signed)
Date and time results received: 02/23/21 0650 Provider notified at [phone]  Test: magnesium level  Critical Value: 6.6  Name of Provider Notified: Dr. Alysia Penna  Orders Received? Or Actions Taken?: turn magnesium down to 1g  [facility]

## 2021-02-24 DIAGNOSIS — O4700 False labor before 37 completed weeks of gestation, unspecified trimester: Secondary | ICD-10-CM | POA: Diagnosis not present

## 2021-02-24 MED ORDER — NIFEDIPINE ER 30 MG PO TB24: 30.0000 mg | ORAL_TABLET | Freq: Every day | ORAL | 0 refills | Status: DC

## 2021-02-24 NOTE — Discharge Summary (Signed)
Physician Discharge Summary  Patient ID: Mary Singleton MRN: [id number] DOB/AGE: 1996/01/13 25 y.o.  Admit date: 02/22/2021 Discharge date: 02/24/2021  Admission Diagnoses: Preterm contractions  Discharge Diagnoses:  Principal Problem:   Indication for care in labor or delivery Active Problems:   Preterm contractions   Discharged Condition: good  Hospital Course: Patient admitted with preterm contractions at 32w with Di-Di twin pregnancy. Patient received tocolysis with magnesium sulfate and procardia with good results. She completed a course of BMZ on 12/21-12/22. Patient reports significant improvement in her contractions. Maternal-fetal status remained stable throughout her admission. Repeat cervical exam remained unchanged. Patient was found stable for discharge home. Preterm labor precautions were reviewed with the patient    Discharge Exam: Blood pressure 107/62, pulse (!) 108, temperature 98.7 F (37.1 C), temperature source Oral, resp. rate 16, last menstrual period 07/19/2020, SpO2 100 %. GENERAL: Well-developed, well-nourished female in no acute distress.  LUNGS: Clear to auscultation bilaterally.  HEART: Regular rate and rhythm. ABDOMEN: Soft, nontender, gravid CERVIX:2/50/ballotable. Chaperone present during the pelvic exam EXTREMITIES: No cyanosis, clubbing, or edema, 2+ distal pulses.  FHR: baseline 135 x 2, mod variability x 2, + accels, no decels  Toco: irregular contractions  Disposition: Home There are no questions and answers to display.         Allergies as of 02/24/2021       Reactions   Pineapple Itching, Swelling   Meloxicam Rash        Medication List     TAKE these medications    acyclovir 400 MG tablet Commonly known as: ZOVIRAX Take 1 tablet (400 mg total) by mouth 3 (three) times daily.   aspirin 81 MG EC tablet Take 1 tablet (81 mg total) by mouth daily. Swallow whole.   Blood Pressure Monitor Misc For regular home bp  monitoring during pregnancy   Doxylamine-Pyridoxine 10-10 MG Tbec Commonly known as: Diclegis 2 tabs q hs, if sx persist add 1 tab q am on day 3, if sx persist add 1 tab q afternoon on day 4 What changed:  how much to take how to take this when to take this additional instructions   ferrous sulfate 325 (65 FE) MG tablet Take 1 tablet (325 mg total) by mouth every other day.   multivitamin-prenatal 27-0.8 MG Tabs tablet Take 1 tablet by mouth daily at 12 noon.   NIFEdipine 30 MG 24 hr tablet Commonly known as: ADALAT CC Take 1 tablet (30 mg total) by mouth daily.   omeprazole 20 MG capsule Commonly known as: PRILOSEC Take 1 capsule (20 mg total) by mouth daily. 1 tablet a day What changed: additional instructions        Follow-up Information     [facility] Follow up on 03/03/2021.   Why: As scheduled for routine prenatal care Contact information: [address] [place] [address] [phone]                Signed: Catalina Antigua 02/24/2021, 7:43 AM

## 2021-02-25 ENCOUNTER — Encounter: Payer: Self-pay | Admitting: Women's Health

## 2021-03-03 ENCOUNTER — Encounter: Payer: Self-pay | Admitting: Women's Health

## 2021-03-03 ENCOUNTER — Ambulatory Visit (INDEPENDENT_AMBULATORY_CARE_PROVIDER_SITE_OTHER): Payer: BC Managed Care – PPO | Admitting: Obstetrics & Gynecology

## 2021-03-03 ENCOUNTER — Encounter: Payer: Self-pay | Admitting: Obstetrics & Gynecology

## 2021-03-03 ENCOUNTER — Other Ambulatory Visit: Payer: Self-pay

## 2021-03-03 VITALS — BP 114/76 | HR 97 | Wt 106.4 lb

## 2021-03-03 DIAGNOSIS — O0993 Supervision of high risk pregnancy, unspecified, third trimester: Secondary | ICD-10-CM

## 2021-03-03 DIAGNOSIS — O30043 Twin pregnancy, dichorionic/diamniotic, third trimester: Secondary | ICD-10-CM

## 2021-03-03 LAB — POCT URINALYSIS DIPSTICK OB
Blood, UA: NEGATIVE
Glucose, UA: NEGATIVE
Ketones, UA: NEGATIVE
Nitrite, UA: NEGATIVE
POC,PROTEIN,UA: NEGATIVE

## 2021-03-03 NOTE — Progress Notes (Signed)
HIGH-RISK PREGNANCY VISIT Patient name: Mary Singleton MRN [id number]  Date of birth: Jan 11, 1996 Chief Complaint:   Routine Prenatal Visit and High Risk Gestation  History of Present Illness:   Mary Singleton is a 25 y.o. G69P0000 female at [redacted]w[redacted]d with an Estimated Date of Delivery: 04/18/21 being seen today for ongoing management of a high-risk pregnancy complicated by: -DC/DA twins Preterm labor- S/p BMZ 12/21-22, no further cervical dilation- 2cm  Today she reports no complaints. Wishes to discuss tubal ligation- does not desire another pregnancy  Contractions: Irritability. Vag. Bleeding: None.  Movement: Present. denies leaking of fluid.   Depression screen [facility] 2/9 10/11/2020 01/09/2017  Decreased Interest 0 0  Down, Depressed, Hopeless 0 0  PHQ - 2 Score 0 0  Altered sleeping 0 -  Tired, decreased energy 0 -  Change in appetite 0 -  Feeling bad or failure about yourself  0 -  Trouble concentrating 1 -  Moving slowly or fidgety/restless 0 -  Suicidal thoughts 0 -  PHQ-9 Score 1 -     Current Outpatient Medications  Medication Instructions   acyclovir (ZOVIRAX) 400 mg, Oral, 3 times daily   aspirin 81 mg, Oral, Daily, Swallow whole.   Blood Pressure Monitor MISC For regular home bp monitoring during pregnancy   Doxylamine-Pyridoxine (DICLEGIS) 10-10 MG TBEC 2 tabs q hs, if sx persist add 1 tab q am on day 3, if sx persist add 1 tab q afternoon on day 4   ferrous sulfate 325 mg, Oral, Every other day   NIFEdipine (ADALAT CC) 30 mg, Oral, Daily   omeprazole (PRILOSEC) 20 mg, Oral, Daily, 1 tablet a day   Prenatal Vit-Fe Fumarate-FA (MULTIVITAMIN-PRENATAL) 27-0.8 MG TABS tablet 1 tablet, Oral, Daily     Review of Systems:   Pertinent items are noted in HPI Denies abnormal vaginal discharge w/ itching/odor/irritation, headaches, visual changes, shortness of breath, chest pain, abdominal pain, severe nausea/vomiting, or problems with urination or bowel movements unless  otherwise stated above. Pertinent History Reviewed:  Reviewed past medical,surgical, social, obstetrical and family history.  Reviewed problem list, medications and allergies. Physical Assessment:   Vitals:   03/03/21 1031  BP: 114/76  Pulse: 97  Weight: 106 lb 6.4 oz (48.3 kg)  Body mass index is 21.49 kg/m.           Physical Examination:   General appearance: alert, well appearing, and in no distress  Mental status: normal mood, behavior, speech, dress, motor activity, and thought processes  Skin: warm & dry   Extremities: Edema: Trace    Cardiovascular: normal heart rate noted  Respiratory: normal respiratory effort, no distress  Abdomen: gravid, soft, non-tender  Pelvic: Cervical exam deferred         Fetal Status:     Movement: Present   Twin A: 145, Twin B: 130- confirmed by [place] vertex/vertex  Fetal Surveillance Testing today: doppler, BSUS   Chaperone: N/A    Results for orders placed or performed in visit on 03/03/21 (from the past 24 hour(s))  POC Urinalysis Dipstick OB   Collection Time: 03/03/21 10:29 AM  Result Value Ref Range   Color, UA     Clarity, UA     Glucose, UA Negative Negative   Bilirubin, UA     Ketones, UA neg    Spec Grav, UA     Blood, UA neg    pH, UA     POC,PROTEIN,UA Negative Negative, Trace, Small (1+), Moderate (2+), Large (3+),  4+   Urobilinogen, UA     Nitrite, UA neg    Leukocytes, UA Small (1+) (A) Negative   Appearance     Odor       Assessment & Plan:  High-risk pregnancy: G1P0000 at [redacted]w[redacted]d with an Estimated Date of Delivery: 04/18/21   1) DC/DA twins -start BPP weekly @ 36wks -currently on procardia daily prn for preterm contractions  2) Contraceptive management -desires tubal ligation- discussed with patient that this is a permanent procedure Risk/benefit and alternative options reviewed including but not limited to risk of bleeding, infection and injury- questions and concerns were addressed and desires to proceed.   Inform consent obtained  3) HSV- on suppression therapy  Meds: No orders of the defined types were placed in this encounter.   Labs/procedures today: none  Treatment Plan:  as outlined above  Reviewed: Preterm labor symptoms and general obstetric precautions including but not limited to vaginal bleeding, contractions, leaking of fluid and fetal movement were reviewed in detail with the patient.  All questions were answered.   Follow-up: Return in about 2 weeks (around 03/17/2021) for HROB visit, BPP weekly,    Future Appointments  Date Time Provider Department Center  03/20/2021 10:30 AM [facility] - [facility] [place] [facility] None  03/20/2021 11:30 AM Cheral Marker, CNM [facility]  03/27/2021 10:30 AM [facility] [place] [facility] None  03/27/2021 11:30 AM Cheral Marker, CNM [facility]  04/03/2021  9:15 AM [facility] [place] [facility] None    Orders Placed This Encounter  Procedures   POC Urinalysis Dipstick OB    Myna Hidalgo, DO Attending Obstetrician & Gynecologist, Faculty Practice Center for [employer], [facility]

## 2021-03-05 NOTE — L&D Delivery Note (Addendum)
Dr Debroah Loop present for entire delivery   [name], [name] [[id number]]  "Mary Singleton"  Delivery Note Progressed to complete dilation with vertex at +2 station  Variable decelerations of FHR noted.  Vacuum extractor readied, but was not required, as Patient pushed well to SVD of twin A.  At 11:47 PM a viable healthy female was delivered via Vaginal, Spontaneous (Presentation: Left Occiput Anterior with compound hand).  APGAR: 8, 9; weight 4 lb 2.7 oz (1890 g).   Placenta status: Spontaneous, Intact.  Cord: 3 vessels with the following complications: None.    Anesthesia: Epidural Episiotomy: None Lacerations: 1st degree;Perineal Suture Repair: 3-0 vicryl rapide Est. Blood Loss (mL): 50  Mom to postpartum.  Baby to NICU.  Wynelle Bourgeois 03/10/2021, 1:28 AM   After delivery of Twin A, Cervix returned to 8+cm.  We reduced it and had patient push, since Twin B began having some variable decelerations.   SHe progressed to complete dilation and vertex descended.  We performed amniotomy for clear fluid and Twin B delivered.    Mary, Singleton Mary [[id number]]   "Rocky Morel"  Delivery Note At 12:20 AM a viable and healthy female was delivered via Vaginal, Spontaneous (Presentation:   Occiput Anterior with compound hand).  APGAR: 8, 9; weight 4 lb 6.2 oz (1990 g).   Placenta status: Spontaneous, Intact.  Cord: 3 vessels with the following complications: Shoulder draped cord  Anesthesia: Epidural Episiotomy: None Lacerations: 1st degree;Perineal Suture Repair: 3.0 vicryl rapide Est. Blood Loss (mL): 50  Mom to postpartum.  Baby to NICU.  Wynelle Bourgeois 03/10/2021, 1:28 AM

## 2021-03-08 ENCOUNTER — Encounter: Payer: Self-pay | Admitting: Obstetrics & Gynecology

## 2021-03-09 ENCOUNTER — Inpatient Hospital Stay (HOSPITAL_COMMUNITY): Payer: BC Managed Care – PPO | Admitting: Anesthesiology

## 2021-03-09 ENCOUNTER — Ambulatory Visit (INDEPENDENT_AMBULATORY_CARE_PROVIDER_SITE_OTHER): Payer: BC Managed Care – PPO | Admitting: *Deleted

## 2021-03-09 ENCOUNTER — Encounter (HOSPITAL_COMMUNITY): Payer: Self-pay | Admitting: Obstetrics & Gynecology

## 2021-03-09 ENCOUNTER — Telehealth: Payer: Self-pay | Admitting: *Deleted

## 2021-03-09 ENCOUNTER — Inpatient Hospital Stay (HOSPITAL_COMMUNITY)
Admission: AD | Admit: 2021-03-09 | Discharge: 2021-03-11 | DRG: 805 | Disposition: A | Discharge status: Home / Self Care | Payer: BC Managed Care – PPO | Attending: Obstetrics and Gynecology | Admitting: Obstetrics and Gynecology

## 2021-03-09 ENCOUNTER — Other Ambulatory Visit: Payer: Self-pay

## 2021-03-09 DIAGNOSIS — D509 Iron deficiency anemia, unspecified: Secondary | ICD-10-CM | POA: Diagnosis present

## 2021-03-09 DIAGNOSIS — Z3A34 34 weeks gestation of pregnancy: Secondary | ICD-10-CM

## 2021-03-09 DIAGNOSIS — O9832 Other infections with a predominantly sexual mode of transmission complicating childbirth: Secondary | ICD-10-CM | POA: Diagnosis present

## 2021-03-09 DIAGNOSIS — O30043 Twin pregnancy, dichorionic/diamniotic, third trimester: Secondary | ICD-10-CM

## 2021-03-09 DIAGNOSIS — O0993 Supervision of high risk pregnancy, unspecified, third trimester: Secondary | ICD-10-CM | POA: Diagnosis not present

## 2021-03-09 DIAGNOSIS — Z20822 Contact with and (suspected) exposure to covid-19: Secondary | ICD-10-CM | POA: Diagnosis present

## 2021-03-09 DIAGNOSIS — O9902 Anemia complicating childbirth: Secondary | ICD-10-CM | POA: Diagnosis present

## 2021-03-09 DIAGNOSIS — O42913 Preterm premature rupture of membranes, unspecified as to length of time between rupture and onset of labor, third trimester: Secondary | ICD-10-CM | POA: Diagnosis present

## 2021-03-09 DIAGNOSIS — O326XX0 Maternal care for compound presentation, not applicable or unspecified: Secondary | ICD-10-CM | POA: Diagnosis not present

## 2021-03-09 DIAGNOSIS — O322XX0 Maternal care for transverse and oblique lie, not applicable or unspecified: Secondary | ICD-10-CM | POA: Diagnosis present

## 2021-03-09 DIAGNOSIS — O42019 Preterm premature rupture of membranes, onset of labor within 24 hours of rupture, unspecified trimester: Secondary | ICD-10-CM | POA: Diagnosis not present

## 2021-03-09 DIAGNOSIS — A6000 Herpesviral infection of urogenital system, unspecified: Secondary | ICD-10-CM | POA: Diagnosis present

## 2021-03-09 LAB — CBC
HCT: 38.5 % (ref 36.0–46.0)
Hemoglobin: 12.1 g/dL (ref 12.0–15.0)
MCH: 27.1 pg (ref 26.0–34.0)
MCHC: 31.4 g/dL (ref 30.0–36.0)
MCV: 86.3 fL (ref 80.0–100.0)
Platelets: 230 10*3/uL (ref 150–400)
RBC: 4.46 MIL/uL (ref 3.87–5.11)
RDW: 21.4 % — ABNORMAL HIGH (ref 11.5–15.5)
WBC: 18.4 10*3/uL — ABNORMAL HIGH (ref 4.0–10.5)
nRBC: 0 % (ref 0.0–0.2)

## 2021-03-09 LAB — RESP PANEL BY RT-PCR (FLU A&B, COVID) ARPGX2
Influenza A by PCR: NEGATIVE
Influenza B by PCR: NEGATIVE
SARS Coronavirus 2 by RT PCR: NEGATIVE

## 2021-03-09 LAB — TYPE AND SCREEN
ABO/RH(D): B POS
Antibody Screen: NEGATIVE

## 2021-03-09 LAB — POCT FERN TEST: POCT Fern Test: POSITIVE

## 2021-03-09 LAB — GROUP B STREP BY PCR: Group B strep by PCR: NEGATIVE

## 2021-03-09 MED ORDER — FENTANYL CITRATE (PF) 100 MCG/2ML IJ SOLN: 50.0000 ug | INTRAMUSCULAR | Status: DC | PRN

## 2021-03-09 MED ORDER — OXYTOCIN-SODIUM CHLORIDE 30-0.9 UT/500ML-% IV SOLN
2.5000 [IU]/h | INTRAVENOUS | Status: DC
  Administered 2021-03-10: 2.5 [IU]/h via INTRAVENOUS
  Filled 2021-03-09: qty 500

## 2021-03-09 MED ORDER — SODIUM CHLORIDE 0.9 % IV SOLN
1.0000 g | INTRAVENOUS | Status: DC
  Administered 2021-03-09: 1 g via INTRAVENOUS
  Filled 2021-03-09 (×3): qty 1000

## 2021-03-09 MED ORDER — EPHEDRINE 5 MG/ML INJ: 10.0000 mg | INTRAVENOUS | Status: DC | PRN

## 2021-03-09 MED ORDER — OXYCODONE-ACETAMINOPHEN 5-325 MG PO TABS: 1.0000 | ORAL_TABLET | ORAL | Status: DC | PRN

## 2021-03-09 MED ORDER — SODIUM CHLORIDE 0.9 % IV SOLN
2.0000 g | Freq: Once | INTRAVENOUS | Status: AC
  Administered 2021-03-09: 2 g via INTRAVENOUS
  Filled 2021-03-09: qty 2000

## 2021-03-09 MED ORDER — LACTATED RINGERS IV SOLN
500.0000 mL | INTRAVENOUS | Status: DC | PRN
  Administered 2021-03-09: 1000 mL via INTRAVENOUS

## 2021-03-09 MED ORDER — OXYCODONE-ACETAMINOPHEN 5-325 MG PO TABS: 2.0000 | ORAL_TABLET | ORAL | Status: DC | PRN

## 2021-03-09 MED ORDER — DIPHENHYDRAMINE HCL 50 MG/ML IJ SOLN: 12.5000 mg | INTRAMUSCULAR | Status: DC | PRN

## 2021-03-09 MED ORDER — AZITHROMYCIN 500 MG PO TABS
1000.0000 mg | ORAL_TABLET | Freq: Once | ORAL | Status: AC
  Administered 2021-03-09: 1000 mg via ORAL
  Filled 2021-03-09: qty 2

## 2021-03-09 MED ORDER — OXYTOCIN BOLUS FROM INFUSION
333.0000 mL | Freq: Once | INTRAVENOUS | Status: AC
  Administered 2021-03-10: 333 mL via INTRAVENOUS

## 2021-03-09 MED ORDER — ACETAMINOPHEN 325 MG PO TABS: 650.0000 mg | ORAL_TABLET | ORAL | Status: DC | PRN

## 2021-03-09 MED ORDER — LIDOCAINE HCL (PF) 1 % IJ SOLN
INTRAMUSCULAR | Status: DC | PRN
  Administered 2021-03-09: 10 mL via EPIDURAL

## 2021-03-09 MED ORDER — LIDOCAINE HCL (PF) 1 % IJ SOLN: 30.0000 mL | INTRAMUSCULAR | Status: DC | PRN

## 2021-03-09 MED ORDER — FENTANYL-BUPIVACAINE-NACL 0.5-0.125-0.9 MG/250ML-% EP SOLN
12.0000 mL/h | EPIDURAL | Status: DC | PRN
  Administered 2021-03-09: 12 mL/h via EPIDURAL
  Filled 2021-03-09: qty 250

## 2021-03-09 MED ORDER — PHENYLEPHRINE 40 MCG/ML (10ML) SYRINGE FOR IV PUSH (FOR BLOOD PRESSURE SUPPORT): 80.0000 ug | PREFILLED_SYRINGE | INTRAVENOUS | Status: DC | PRN

## 2021-03-09 MED ORDER — LACTATED RINGERS IV SOLN: 500.0000 mL | Freq: Once | INTRAVENOUS | Status: DC

## 2021-03-09 MED ORDER — LACTATED RINGERS IV SOLN: INTRAVENOUS | Status: DC

## 2021-03-09 MED ORDER — SOD CITRATE-CITRIC ACID 500-334 MG/5ML PO SOLN: 30.0000 mL | ORAL | Status: DC | PRN

## 2021-03-09 MED ORDER — ONDANSETRON HCL 4 MG/2ML IJ SOLN: 4.0000 mg | Freq: Four times a day (QID) | INTRAMUSCULAR | Status: DC | PRN

## 2021-03-09 NOTE — Progress Notes (Signed)
Patient ID: Mary Singleton, female   DOB: 11-04-1995, 26 y.o.   MRN: [id number] Comfortable with epidural   Vitals:   03/09/21 1930 03/09/21 2000 03/09/21 2030 03/09/21 2100  BP: 117/80 119/80 120/82 120/81  Pulse: 76 78 77 74  Resp:    16  Temp:    98.7 F (37.1 C)  TempSrc:    Oral  SpO2: 100%     Weight:      Height:       FHR reassuring both twins UCs regular  Cervical exam deferred

## 2021-03-09 NOTE — Anesthesia Procedure Notes (Signed)
Epidural Patient location during procedure: OB Start time: 03/09/2021 4:06 PM End time: 03/09/2021 4:16 PM  Staffing Anesthesiologist: Lucretia Kern, MD Performed: anesthesiologist   Preanesthetic Checklist Completed: patient identified, IV checked, risks and benefits discussed, monitors and equipment checked, pre-op evaluation and timeout performed  Epidural Patient position: sitting Prep: DuraPrep Patient monitoring: heart rate, continuous pulse ox and blood pressure Approach: midline Location: L3-L4 Injection technique: LOR air  Needle:  Needle type: Tuohy  Needle gauge: 17 G Needle length: 9 cm Needle insertion depth: 5 cm Catheter type: closed end flexible Catheter size: 19 Gauge Catheter at skin depth: 10 cm Test dose: negative  Assessment Events: blood not aspirated, injection not painful, no injection resistance, no paresthesia and negative IV test  Additional Notes Reason for block:procedure for pain

## 2021-03-09 NOTE — Anesthesia Preprocedure Evaluation (Signed)
Anesthesia Evaluation  Patient identified by MRN, date of birth, ID band Patient awake    Reviewed: Allergy & Precautions, H&P , NPO status , Patient's Chart, lab work & pertinent test results  History of Anesthesia Complications Negative for: history of anesthetic complications  Airway Mallampati: II  TM Distance: >3 FB     Dental   Pulmonary neg pulmonary ROS,    Pulmonary exam normal        Cardiovascular negative cardio ROS   Rhythm:regular Rate:Normal     Neuro/Psych negative neurological ROS  negative psych ROS   GI/Hepatic negative GI ROS, Neg liver ROS,   Endo/Other  negative endocrine ROS  Renal/GU negative Renal ROS  negative genitourinary   Musculoskeletal   Abdominal   Peds  Hematology negative hematology ROS (+)   Anesthesia Other Findings   Reproductive/Obstetrics (+) Pregnancy (twins)                             Anesthesia Physical Anesthesia Plan  ASA: 2  Anesthesia Plan: Epidural   Post-op Pain Management:    Induction:   PONV Risk Score and Plan:   Airway Management Planned:   Additional Equipment:   Intra-op Plan:   Post-operative Plan:   Informed Consent: I have reviewed the patients History and Physical, chart, labs and discussed the procedure including the risks, benefits and alternatives for the proposed anesthesia with the patient or authorized representative who has indicated his/her understanding and acceptance.       Plan Discussed with:   Anesthesia Plan Comments:         Anesthesia Quick Evaluation

## 2021-03-09 NOTE — Progress Notes (Addendum)
° °  NURSE VISIT- NST  SUBJECTIVE:  Mary Singleton is a 26 y.o. G1P0000 female at [redacted]w[redacted]d, here for a NST for pregnancy complicated by Multiple gestation.  She reports active fetal movement, contractions: described as "bad period cramps", vaginal bleeding: none, membranes: intact, possible rupture, States when she went to use the bathroom and after she cleaned herself, she noticed a gush of fluid come out. Since that time she has continued to have a trickle of fluid that is causing her underwear to be wet.   OBJECTIVE:  LMP 07/19/2020   Appears uncomfortable and grimacing with contractions and shaking. States "these feel worse than last time, I just don't feel good."  No results found for this or any previous visit (from the past 24 hour(s)).  NST: FHR A baseline 145 bpm, Variability: moderate, Accelerations:present, Decelerations:  Absent= Cat 1/reactive  FHR B baseline 140 bpm, Variability:moderate, Accels: absent, Decelerations: Absent=Cat 1/reactive Toco: regular, every 2 minutes   ASSESSMENT: G1P0000 at [redacted]w[redacted]d with Multiple gestation NST reactive, possible labor  PLAN: EFM strip reviewed by Dr. Despina Hidden   Recommendations: send to [facility] MAU Misty Stanley [place] notified    Debbe Odea Wilkins Elpers  03/09/2021 1:57 PM  Attestation of Attending Supervision of Nursing Visit Encounter: Evaluation and management procedures were performed by the nursing staff under my supervision and collaboration.  I have reviewed the nurse's note and chart, and I agree with the management and plan.  Rockne Coons MD Attending Physician for the [facility] 03/10/2021 1:25 PM

## 2021-03-09 NOTE — H&P (Signed)
OBSTETRIC ADMISSION HISTORY AND PHYSICAL  Mary Singleton is a 26 y.o. female G1P0000 with IUP at [redacted]w[redacted]d by 9 wk [place] presenting for early labor and PPROM. She start experiencing painful contractions earlier today with subsequent ROM in transit to the hospital. She reports +FMs, No LOF, no VB, no blurry vision, headaches or peripheral edema, and RUQ pain.  She plans on formula feeding. She is undecided for birth control when asked, however note request for BTL in her chart with consent signed 12/30.   She received her prenatal care at [facility]   Dating: By 9 wk [place] --->  Estimated Date of Delivery: 04/18/21  Sono:    @[redacted]w[redacted]d , CWD, normal anatomy, cephalic presentation, [id number], 18% EFW of twin A, 36% of twin B with 7.5% discordance    Prenatal History/Complications:  --DCDA twin gestation (boy and girl)  --Iron deficiency anemia  --History of pre-term contractions, received BMZ on 12/21 and 12/22   Past Medical History: History reviewed. No pertinent past medical history.  Past Surgical History: Past Surgical History:  Procedure Laterality Date   BIOPSY  08/16/2017   Procedure: BIOPSY;  Surgeon: Rogene Houston, MD;  [facility];  Service: Endoscopy;;  duodenum   ESOPHAGOGASTRODUODENOSCOPY (EGD) WITH PROPOFOL N/A 08/16/2017   Procedure: ESOPHAGOGASTRODUODENOSCOPY (EGD) WITH PROPOFOL;  Surgeon: Rogene Houston, MD;  [facility];  Service: Endoscopy;  Laterality: N/A;  8:30   EYE SURGERY      Obstetrical History: OB History     Gravida  1   Para  0   Term  0   Preterm  0   AB  0   Living  0      SAB  0   IAB  0   Ectopic  0   Multiple  1   Live Births  0           Social History Social History   Socioeconomic History   Marital status: Single    Spouse name: Not on file   Number of children: 0   Years of education: Not on file   Highest education level: Not on file  Occupational History   Not on file  Tobacco Use   Smoking  status: Never   Smokeless tobacco: Never  Vaping Use   Vaping Use: Never used  Substance and Sexual Activity   Alcohol use: No   Drug use: No   Sexual activity: Not Currently    Birth control/protection: None  Other Topics Concern   Not on file  Social History Narrative   Not on file   Social Determinants of Health   Financial Resource Strain: Low Risk    Difficulty of Paying Living Expenses: Not hard at all  Food Insecurity: No Food Insecurity   Worried About Charity fundraiser in the Last Year: Never true   [place] in the Last Year: Never true  Transportation Needs: No Transportation Needs   Lack of Transportation (Medical): No   Lack of Transportation (Non-Medical): No  Physical Activity: Insufficiently Active   Days of Exercise per Week: 3 days   Minutes of Exercise per Session: 10 min  Stress: No Stress Concern Present   Feeling of Stress : Not at all  Social Connections: Moderately Isolated   Frequency of Communication with Friends and Family: More than three times a week   Frequency of Social Gatherings with Friends and Family: More than three times a week  Attends Religious Services: More than 4 times per year   Active Member of Clubs or Organizations: No   Attends Archivist Meetings: Never   Marital Status: Never married    Family History: Family History  Problem Relation Age of Onset   Healthy Mother    Cancer Maternal Grandmother     Allergies: Allergies  Allergen Reactions   Pineapple Itching and Swelling   Meloxicam Rash    Medications Prior to Admission  Medication Sig Dispense Refill Last Dose   acyclovir (ZOVIRAX) 400 MG tablet Take 1 tablet (400 mg total) by mouth 3 (three) times daily. 90 tablet 3 03/08/2021   aspirin 81 MG EC tablet Take 1 tablet (81 mg total) by mouth daily. Swallow whole. 90 tablet 3    Blood Pressure Monitor MISC For regular home bp monitoring during pregnancy 1 each 0    Doxylamine-Pyridoxine  (DICLEGIS) 10-10 MG TBEC 2 tabs q hs, if sx persist add 1 tab q am on day 3, if sx persist add 1 tab q afternoon on day 4 (Patient taking differently: Take 2-4 tablets by mouth See admin instructions. Take 2 tablets at bedtime. If symptoms persist, add 1 tablet in the morning on day 3, if symptoms persist add 1 tablet in the afternoon on day 4.) 100 tablet 6    ferrous sulfate 325 (65 FE) MG tablet Take 1 tablet (325 mg total) by mouth every other day. 45 tablet 2    NIFEdipine (ADALAT CC) 30 MG 24 hr tablet Take 1 tablet (30 mg total) by mouth daily. 30 tablet 0    omeprazole (PRILOSEC) 20 MG capsule Take 1 capsule (20 mg total) by mouth daily. 1 tablet a day (Patient taking differently: Take 20 mg by mouth daily.) 30 capsule 6    Prenatal Vit-Fe Fumarate-FA (MULTIVITAMIN-PRENATAL) 27-0.8 MG TABS tablet Take 1 tablet by mouth daily at 12 noon.        Review of Systems   All systems reviewed and negative except as stated in HPI  Blood pressure 113/71, pulse 87, temperature 98 F (36.7 C), temperature source Oral, resp. rate 18, height 4\' 11"  (1.499 m), weight 48.3 kg, last menstrual period 07/19/2020, SpO2 100 %. General appearance: alert and cooperative, uncomfortable and breathing through contractions  Lungs: Normal WOB  Heart: regular rate and rhythm Abdomen: soft, non-tender Pelvic: NEFG Extremities: Homans sign is negative, no sign of DVT Presentation: cephalic x2 by BSUS  Fetal monitoring Fetal A  Baseline: 135 bpm, Variability: Good {> 6 bpm), Accelerations: Reactive, and Decelerations: Absent  Fetal B  Baseline 140 Moderate 15x15 None  Uterine activity every 2 minutes  Dilation: 4 Effacement (%): 100 Station: Plus 1 Exam by:: Dr. Dione Plover   Prenatal labs: ABO, Rh: --/--/B POS (01/05 1440) Antibody: NEG (01/05 1440) Rubella: 1.28 (08/09 1543) RPR: NON REACTIVE (12/21 1710)  HBsAg: Negative (08/09 1543)  HIV: Non Reactive (11/28 0811)  GBS:    2 hr Glucola  passed Genetic screening Low risk NT/IT  Anatomy US normal   Prenatal Transfer Tool  Maternal Diabetes: No Genetic Screening: Normal Maternal Ultrasounds/Referrals: Normal Fetal Ultrasounds or other Referrals:  None Maternal Substance Abuse:  No Significant Maternal Medications:  None Significant Maternal Lab Results: None  Results for orders placed or performed during the hospital encounter of 03/09/21 (from the past 24 hour(s))  Resp Panel by RT-PCR (Flu A&B, Covid) Nasopharyngeal Swab   Collection Time: 03/09/21  2:40 PM   Specimen: Nasopharyngeal Swab; Nasopharyngeal(NP) swabs  in vial transport medium  Result Value Ref Range   SARS Coronavirus 2 by RT PCR NEGATIVE NEGATIVE   Influenza A by PCR NEGATIVE NEGATIVE   Influenza B by PCR NEGATIVE NEGATIVE  CBC   Collection Time: 03/09/21  2:40 PM  Result Value Ref Range   WBC 18.4 (H) 4.0 - 10.5 K/uL   RBC 4.46 3.87 - 5.11 MIL/uL   Hemoglobin 12.1 12.0 - 15.0 g/dL   HCT 38.5 36.0 - 46.0 %   MCV 86.3 80.0 - 100.0 fL   MCH 27.1 26.0 - 34.0 pg   MCHC 31.4 30.0 - 36.0 g/dL   RDW 21.4 (H) 11.5 - 15.5 %   Platelets 230 150 - 400 K/uL   nRBC 0.0 0.0 - 0.2 %  Type and screen [place]   Collection Time: 03/09/21  2:40 PM  Result Value Ref Range   ABO/RH(D) B POS    Antibody Screen NEG    Sample Expiration      03/12/2021,2359 Performed at [facility], [place] [address], [place], [place] [address]   Maryann Alar Test   Collection Time: 03/09/21  3:35 PM  Result Value Ref Range   POCT Fern Test Positive = ruptured amniotic membanes     Patient Active Problem List   Diagnosis Date Noted   Indication for care in labor and delivery, antepartum 03/09/2021   Indication for care in labor or delivery 02/22/2021   Preterm contractions 02/22/2021   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 G1P0000 at [redacted]w[redacted]d here for pre-term early labor with ROM of clear fluid around 1200.   #Pre-term Labor   twin gestation: Ctx onset earlier today with subsequent ROM. Cephalic/cephalic on BSUS with [id number] discordance (twin B larger) on 30 w [place]. Will observe, appears to be uncomfortable with persistent frequent contraction pattern. Amp given and azithromycin ordered. Already s/p BMZ on 12/21 and 12/22. Discussed with patient the possibility of C/S if change in fetal positioning or inability to retrieve infant B. She is aware and would like to proceed with vaginal delivery. NICU made aware of her arrival.  #Pain: Planning for epidural  #FWB: Cat 1 x2  #ID: GBS unknown, Amp ordered. GBS PCR collected.  #MOF: Formula  #MOC: Undecided, considering BTL   #HSV: No current lesions, evaluated in the MAU. She has been on prophylactic acyclovir.    Patriciaann Clan, DO  03/09/2021, 4:06 PM

## 2021-03-09 NOTE — MAU Note (Signed)
Pt reports ctx  water broke on way to hospital.
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
## 2021-03-09 NOTE — Telephone Encounter (Signed)
Patient called with concerns that her water may have broken. She said that she went to use the bathroom and  after she cleaned herself she noticed a gush of fluid come out. Since that time she has continued to have a trickle of fluid that is causing her underwear to be wet. Advised to come to office at 115 for visit to check for rupture.

## 2021-03-10 ENCOUNTER — Encounter (HOSPITAL_COMMUNITY): Payer: Self-pay | Admitting: Obstetrics & Gynecology

## 2021-03-10 ENCOUNTER — Encounter: Payer: Self-pay | Admitting: Women's Health

## 2021-03-10 LAB — RPR: RPR Ser Ql: NONREACTIVE

## 2021-03-10 MED ORDER — DIPHENHYDRAMINE HCL 25 MG PO CAPS: 25.0000 mg | ORAL_CAPSULE | Freq: Four times a day (QID) | ORAL | Status: DC | PRN

## 2021-03-10 MED ORDER — ONDANSETRON HCL 4 MG/2ML IJ SOLN: 4.0000 mg | INTRAMUSCULAR | Status: DC | PRN

## 2021-03-10 MED ORDER — SIMETHICONE 80 MG PO CHEW: 80.0000 mg | CHEWABLE_TABLET | ORAL | Status: DC | PRN

## 2021-03-10 MED ORDER — ONDANSETRON HCL 4 MG PO TABS: 4.0000 mg | ORAL_TABLET | ORAL | Status: DC | PRN

## 2021-03-10 MED ORDER — DIBUCAINE (PERIANAL) 1 % EX OINT: 1.0000 "application " | TOPICAL_OINTMENT | CUTANEOUS | Status: DC | PRN

## 2021-03-10 MED ORDER — ZOLPIDEM TARTRATE 5 MG PO TABS: 5.0000 mg | ORAL_TABLET | Freq: Every evening | ORAL | Status: DC | PRN

## 2021-03-10 MED ORDER — TETANUS-DIPHTH-ACELL PERTUSSIS 5-2.5-18.5 LF-MCG/0.5 IM SUSY: 0.5000 mL | PREFILLED_SYRINGE | Freq: Once | INTRAMUSCULAR | Status: DC

## 2021-03-10 MED ORDER — COCONUT OIL OIL: 1.0000 "application " | TOPICAL_OIL | Status: DC | PRN

## 2021-03-10 MED ORDER — WITCH HAZEL-GLYCERIN EX PADS: 1.0000 "application " | MEDICATED_PAD | CUTANEOUS | Status: DC | PRN

## 2021-03-10 MED ORDER — ACETAMINOPHEN 325 MG PO TABS
650.0000 mg | ORAL_TABLET | ORAL | Status: DC | PRN
  Administered 2021-03-10 – 2021-03-11 (×3): 650 mg via ORAL
  Filled 2021-03-10 (×3): qty 2

## 2021-03-10 MED ORDER — BENZOCAINE-MENTHOL 20-0.5 % EX AERO: 1.0000 "application " | INHALATION_SPRAY | CUTANEOUS | Status: DC | PRN

## 2021-03-10 MED ORDER — TERBUTALINE SULFATE 1 MG/ML IJ SOLN: 0.2500 mg | Freq: Once | INTRAMUSCULAR | Status: DC | PRN

## 2021-03-10 MED ORDER — SENNOSIDES-DOCUSATE SODIUM 8.6-50 MG PO TABS
2.0000 | ORAL_TABLET | ORAL | Status: DC
  Administered 2021-03-10 – 2021-03-11 (×2): 2 via ORAL
  Filled 2021-03-10 (×2): qty 2

## 2021-03-10 MED ORDER — IBUPROFEN 600 MG PO TABS
600.0000 mg | ORAL_TABLET | Freq: Four times a day (QID) | ORAL | Status: DC | PRN
  Administered 2021-03-10: 600 mg via ORAL
  Filled 2021-03-10: qty 1

## 2021-03-10 MED ORDER — OXYTOCIN-SODIUM CHLORIDE 30-0.9 UT/500ML-% IV SOLN
1.0000 m[IU]/min | INTRAVENOUS | Status: DC
  Administered 2021-03-10: 2 m[IU]/min via INTRAVENOUS

## 2021-03-10 MED ORDER — PRENATAL MULTIVITAMIN CH
1.0000 | ORAL_TABLET | Freq: Every day | ORAL | Status: DC
  Filled 2021-03-10: qty 1
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
## 2021-03-10 NOTE — Anesthesia Postprocedure Evaluation (Signed)
Anesthesia Post Note  Patient: Mary Singleton  Procedure(s) Performed: AN AD HOC LABOR EPIDURAL     Patient location during evaluation: Mother Baby Anesthesia Type: Epidural Level of consciousness: awake and alert and oriented Pain management: satisfactory to patient Vital Signs Assessment: post-procedure vital signs reviewed and stable Respiratory status: respiratory function stable Cardiovascular status: stable Postop Assessment: no headache, no backache, epidural receding, patient able to bend at knees, no signs of nausea or vomiting, adequate PO intake and able to ambulate Anesthetic complications: no   No notable events documented.  Last Vitals:  Vitals:   03/10/21 0338 03/10/21 0747  BP: 110/71 105/78  Pulse: 64 68  Resp: 18 18  Temp: 37 C 36.7 C  SpO2: 100% 99%    Last Pain:  Vitals:   03/10/21 0747  TempSrc: Oral  PainSc: 0-No pain   Pain Goal:                   Lenzi Marmo

## 2021-03-10 NOTE — Discharge Summary (Signed)
Postpartum Discharge Summary       Patient Name: Mary Singleton DOB: Mar 25, 1995 MRN: [id number]  Date of admission: 03/09/2021 Delivery date:   Makinzi, Prieur [[id number]]  03/09/2021    Mercia, Dowe [[id number]]  03/10/2021  Delivering provider:    Sherald Barge [[id number]]  Cale, Leeanna, Slaby Odessa [[id number]]  Hansel Feinstein L  Date of discharge: 03/11/2021  Admitting diagnosis: Indication for care in labor and delivery, antepartum [O75.9]  Preterm Labor, Multifetal Twin Gestation at [redacted]w[redacted]d Intrauterine pregnancy: 320w3d   Secondary diagnosis:  Principal Problem:   Indication for care in labor and delivery, antepartum  Additional problems: none    Discharge diagnosis: Preterm Pregnancy Delivered and Twin Gestation, preterm                                               Post partum procedures: none Augmentation: AROM and Pitocin  AROM and Pitocin were used after delivery of Twin A to expedite delivery of Twin B Complications: None  Hospital course: Onset of Labor With Vaginal Delivery      [age].o. yo G1P0102 at 3452w3ds admitted in Latent Labor on 03/09/2021. Patient had an uncomplicated labor course as follows:  Membrane Rupture Time/Date:    CurLaterica, Matarazzo3[[id number]]2:00 PM    CurKataleah, BejarrSouth Barre3[[id number]]1:59 PM ,   CurLoy, Little3[[id number]]/07/2021    CurShakina, Choy3[[id number]]/07/2021   Delivery Method:   CurSherald Barge3[[id number]]aginal, Spontaneous    CurTegan, Britainr[place]ze3[[id number]]aginal, Spontaneous  Episiotomy:    CurJoanell, Cressler3[[id number]]one    CurOneika, SimonianrHomewood3[[id number]]one  Lacerations:     CurAvonna, Iribe3[[id number]]st degree;Perineal    CurAngel, Hobdy3[[id number]]st degree;Perineal  Patient had an uncomplicated postpartum course.  She is ambulating, tolerating a regular diet, passing flatus, and urinating  well. Patient is discharged home in stable condition on 03/11/21.  Newborn Data: Birth date:   CurOtelia, Hettinger3[[id number]]/07/2021    CurJacquelyne, Quarry3[[id number]]/08/2021  Birth time:   CurQueenie, Aufiero3[[id number]]1:47 PM    CurJamala, Kohen[place]3[[id number]]2:20 AM  Gender:   CurAmbrielle, Kington3[[id number]]emale    CurResha, Filippone3[[id number]]ale  Living status:   CurMiliana, Gangwer3[[id number]]iving    CurLatunya, Kissickr[place]app[place]3[[id number]]iving  Apgars:   CurAsani, Deniston3[[id number]]  [address]3[[id number]] ,[place]  CurZareena, Willis3[[id number]]    CurSylwia, Cuervor[place]all[place]3[[id number]]  Weight:   CurAdriana, Quinby3[[id number]]890 g    CurOnya, Eutsler3[[id number]]990 g   Magnesium Sulfate received: No BMZ received: Yes Rhophylac:N/A MMR:No T-DaP: declined Flu: No Transfusion:No  Physical exam  Vitals:   03/10/21 1501 03/10/21 1949 03/11/21 0341 03/11/21 0810  BP: 114/78 112/69 (!) 124/93 124/83  Pulse: 72 74 69 69  Resp: 18 16 16 16   Temp: 98.1 F (36.7 C) 98.3 F (36.8 C) 98 F (36.7 C) 97.7 F (36.5 C)  TempSrc: Oral Oral Oral Oral  SpO2: 100% 100% 98%  100%  Weight:      Height:       General: alert, cooperative, and no distress Lochia: appropriate Uterine Fundus: firm DVT Evaluation: No evidence of DVT seen on physical exam. Labs: Lab Results  Component Value Date   WBC 18.4 (H) 03/09/2021   HGB 12.1 03/09/2021   HCT 38.5 03/09/2021   MCV 86.3 03/09/2021   PLT 230 03/09/2021   CMP Latest Ref Rng & Units 03/11/2018  Calcium 8.6 - 10.2 mg/dL 9.9   Edinburgh Score: Edinburgh Postnatal Depression Scale Screening Tool 03/10/2021  I have been able to laugh and see the funny side of things. 0  I have looked forward with enjoyment to things. 0  I have blamed myself unnecessarily when things went wrong. 0  I have been anxious or worried for no good reason.  0  I have felt scared or panicky for no good reason. 0  Things have been getting on top of me. 0  I have been so unhappy that I have had difficulty sleeping. 0  I have felt sad or miserable. 0  I have been so unhappy that I have been crying. 0  The thought of harming myself has occurred to me. 0  Edinburgh Postnatal Depression Scale Total 0      After visit meds:  Allergies as of 03/11/2021       Reactions   Pineapple Itching, Swelling   Meloxicam Rash        Medication List     TAKE these medications    acyclovir 400 MG tablet Commonly known as: ZOVIRAX Take 1 tablet (400 mg total) by mouth 3 (three) times daily.   aspirin 81 MG EC tablet Take 1 tablet (81 mg total) by mouth daily. Swallow whole.   Blood Pressure Monitor Misc For regular home bp monitoring during pregnancy   Doxylamine-Pyridoxine 10-10 MG Tbec Commonly known as: Diclegis 2 tabs q hs, if sx persist add 1 tab q am on day 3, if sx persist add 1 tab q afternoon on day 4 What changed:  how much to take how to take this when to take this additional instructions   ferrous sulfate 325 (65 FE) MG tablet Take 1 tablet (325 mg total) by mouth every other day.   multivitamin-prenatal 27-0.8 MG Tabs tablet Take 1 tablet by mouth daily at 12 noon.   NIFEdipine 30 MG 24 hr tablet Commonly known as: ADALAT CC Take 1 tablet (30 mg total) by mouth daily.   omeprazole 20 MG capsule Commonly known as: PRILOSEC Take 1 capsule (20 mg total) by mouth daily. 1 tablet a day What changed: additional instructions       Please schedule this patient for Postpartum visit in: 4 weeks with the following provider: Any provider In-Person For C/S patients schedule nurse incision check in weeks 2 weeks: no High risk pregnancy complicated by:  Twin Gestation, Preterm Delivery Delivery mode:  SVD Anticipated Birth Control:  other/unsure PP Procedures needed:  none   Edinburgh: negative Schedule Integrated BH visit: no   NICU relevant baby issues   Discharge home in stable condition Infant Feeding: Bottle Infant Disposition:NICU Discharge instruction: per After Visit Summary and Postpartum booklet. Activity: Advance as tolerated. Pelvic rest for 6 weeks.  Diet: routine diet Anticipated Birth Control: Unsure Postpartum Appointment:4 weeks Additional Postpartum F/U:  none Future Appointments: Future Appointments  Date Time Provider [place]  04/14/2021 11:10 AM Myrtis Ser, CNM [facility]   Follow up Visit:  Follow-up Information     [facility] Follow up.   Specialty: Obstetrics and Gynecology Contact information: [address] [place] [phone]                    03/11/2021 Donnamae Jude, MD

## 2021-03-10 NOTE — Lactation Note (Signed)
This note was copied from a baby's chart. Lactation Consultation Note LC spoke briefly with mother about providing milk for her babies. Mother declines to breast pump at this time. LC services are complete. No charge for this encounter.   Patient Name: Mary Singleton Today's Date: 03/10/2021   Age:24 hours   Elder Negus 03/10/2021, 9:26 AM

## 2021-03-13 ENCOUNTER — Encounter: Payer: Self-pay | Admitting: Women's Health

## 2021-03-14 LAB — SURGICAL PATHOLOGY

## 2021-03-20 ENCOUNTER — Encounter: Payer: Medicaid Other | Admitting: Women's Health

## 2021-03-20 ENCOUNTER — Other Ambulatory Visit: Payer: Medicaid Other

## 2021-03-23 ENCOUNTER — Telehealth (HOSPITAL_COMMUNITY): Payer: Self-pay | Admitting: *Deleted

## 2021-03-23 NOTE — Telephone Encounter (Signed)
Mom reports feeling good. No concerns about herself at this time. EPDS=1  (hospital score= 0)  Twins are still in the NICU and doing well per mom.  Duffy Rhody, RN 03-23-2021 at 3:13pm

## 2021-03-27 ENCOUNTER — Other Ambulatory Visit: Payer: BC Managed Care – PPO

## 2021-03-27 ENCOUNTER — Encounter: Payer: BC Managed Care – PPO | Admitting: Women's Health

## 2021-04-03 ENCOUNTER — Encounter: Payer: BC Managed Care – PPO | Admitting: Women's Health

## 2021-04-03 ENCOUNTER — Other Ambulatory Visit: Payer: BC Managed Care – PPO

## 2021-04-03 ENCOUNTER — Other Ambulatory Visit: Payer: Medicaid Other

## 2021-04-14 ENCOUNTER — Ambulatory Visit: Payer: BC Managed Care – PPO | Admitting: Advanced Practice Midwife

## 2021-04-17 ENCOUNTER — Ambulatory Visit (INDEPENDENT_AMBULATORY_CARE_PROVIDER_SITE_OTHER): Payer: BC Managed Care – PPO | Admitting: Advanced Practice Midwife

## 2021-04-17 ENCOUNTER — Other Ambulatory Visit: Payer: Self-pay

## 2021-04-17 ENCOUNTER — Encounter: Payer: Self-pay | Admitting: Advanced Practice Midwife

## 2021-04-17 MED ORDER — NORGESTIMATE-ETH ESTRADIOL 0.25-35 MG-MCG PO TABS: 1.0000 | ORAL_TABLET | Freq: Every day | ORAL | 4 refills | Status: DC

## 2021-04-17 NOTE — Progress Notes (Signed)
Post Partum Visit Note   Chief Complaint:   Postpartum Care (Talk about birth control)  History of Present Illness:   Mary Singleton is a 26 y.o. G73P0102 Caucasian female being seen today for a postpartum visit. She is 6 weeks postpartum following a spontaneous vaginal delivery at 34.2 gestational weeks. IOL: No, for n/a. Anesthesia: epidural.  Laceration: 2nd degree.  Complications: none. Inpatient contraception: no.   Pregnancy complicated by twins . Tobacco use: no. Substance use disorder: no. Last pap smear: 10/11/20 and results were NILM w/ HRHPV not done. Next pap smear due: 2025 Patient's last menstrual period was 04/15/2021.  Postpartum course has been uncomplicated. Bleeding  started period 2 days ago . Bowel function is normal. Bladder function is normal. Urinary incontinence? No, fecal incontinence? No Patient is not sexually active. Last sexual activity: prior to birth.    Upstream - 04/17/21 1629       Pregnancy Intention Screening   Does the patient want to become pregnant in the next year? No    Does the patient's partner want to become pregnant in the next year? No    Would the patient like to discuss contraceptive options today? Yes      Contraception Wrap Up   Current Method Abstinence    End Method Abstinence    Contraception Counseling Provided Yes            The pregnancy intention screening data noted above was reviewed. Potential methods of contraception were discussed. The patient elected to proceed with Abstinence.  Edinburgh Postpartum Depression Screening: Negative  Edinburgh Postnatal Depression Scale - 04/17/21 1630       Edinburgh Postnatal Depression Scale:  In the Past 7 Days   I have been able to laugh and see the funny side of things. 0    I have looked forward with enjoyment to things. 0    I have blamed myself unnecessarily when things went wrong. 0    I have been anxious or worried for no good reason. 0    I have felt scared or panicky  for no good reason. 0    Things have been getting on top of me. 0    I have been so unhappy that I have had difficulty sleeping. 0    I have felt sad or miserable. 0    I have been so unhappy that I have been crying. 0    The thought of harming myself has occurred to me. 0    Edinburgh Postnatal Depression Scale Total 0            Baby's course has been uncomplicated. Baby is feeding by bottle. Infant has a pediatrician/family doctor? Yes.  Childcare strategy if returning to work/school: yes.  Pt has material needs met for her and baby: Yes.   Review of Systems:   Pertinent items are noted in HPI Denies Abnormal vaginal discharge w/ itching/odor/irritation, headaches, visual changes, shortness of breath, chest pain, abdominal pain, severe nausea/vomiting, or problems with urination or bowel movements. Pertinent History Reviewed:  Reviewed past medical,surgical, obstetrical and family history.  Reviewed problem list, medications and allergies. OB History  Gravida Para Term Preterm AB Living  1 1 0 1 0 2  SAB IAB Ectopic Multiple Live Births  0 0 0 2 2    # Outcome Date GA Lbr Len/2nd Weight Sex Delivery Anes PTL Lv  1A Preterm 03/09/21 [redacted]w[redacted]d/ 00:39 4 lb 2.7 oz (1.89 kg) F Vag-Spont EPI  LIV  1B Preterm 03/10/21 [redacted]w[redacted]d/ 01:12 4 lb 6.2 oz (1.99 kg) M Vag-Spont EPI  LIV   Physical Assessment:   Vitals:   04/17/21 1616  BP: 94/62  Pulse: 71  Weight: 88 lb 6.4 oz (40.1 kg)  Height: 4' 11"  (1.499 m)  Body mass index is 17.85 kg/m.  Objective:  Blood pressure 94/62, pulse 71, height 4' 11"  (1.499 m), weight 88 lb 6.4 oz (40.1 kg), last menstrual period 04/15/2021, not currently breastfeeding.  General:  alert, cooperative, and no distress   Breasts:  negative  Lungs: Normal respiratory effort  Heart:  regular rate and rhythm  Abdomen: soft, non-tender   Vulva:  normal  Vagina: normal vagina  Cervix:  normal  Corpus: Well involuted  Adnexa:  not evaluated  Rectal Exam:  no hemorrhoids          No results found for this or any previous visit (from the past 24 hour(s)).  Assessment & Plan:  1) Postpartum exam 2) 6 wks s/p spontaneous vaginal delivery 3) bottle feeding 4) Depression screening 5) Contraception management: COCs  Essential components of care per ACOG recommendations:  1.  Mood and well being:  If positive depression screen, discussed and plan developed.  If using tobacco we discussed reduction/cessation and risk of relapse If current substance abuse, we discussed and referral to local resources was offered.   2. Infant care and feeding:  If breastfeeding, discussed returning to work, pumping, breastfeeding-associated pain, guidance regarding return to fertility while lactating if not using another method. If needed, patient was provided with a letter to be allowed to pump q 2-3hrs to support lactation in a private location with access to a refrigerator to store breastmilk.   Recommended that all caregivers be immunized for flu, pertussis and other preventable communicable diseases If pt does not have material needs met for her/baby, referred to local resources for help obtaining these.  3. Sexuality, contraception and birth spacing Provided guidance regarding sexuality, management of dyspareunia, and resumption of intercourse Discussed avoiding interpregnancy interval <648ms and recommended birth spacing of 18 months  4. Sleep and fatigue Discussed coping options for fatigue and sleep disruption Encouraged family/partner/community support of 4 hrs of uninterrupted sleep to help with mood and fatigue  5. Physical recovery  If pt had a C/S, assessed incisional pain and providing guidance on normal vs prolonged recovery If pt had a laceration, perineal healing and pain reviewed.  If urinary or fecal incontinence, discussed management and referred to PT or uro/gyn if indicated  Patient is safe to resume physical activity. Discussed  attainment of healthy weight.  6.  Chronic disease management Discussed pregnancy complications if any, and their implications for future childbearing and long-term maternal health. Review recommendations for prevention of recurrent pregnancy complications, such as 17 hydroxyprogesterone caproate to reduce risk for recurrent PTB yes, or aspirin to reduce risk of preeclampsia not applicable. Pt had GDM: No. If yes, 2hr GTT scheduled: not applicable. Reviewed medications and non-pregnant dosing including consideration of whether pt is breastfeeding using a reliable resource such as LactMed: not applicable Referred for f/u w/ PCP or subspecialist providers as indicated: not applicable  7. Health maintenance Mammogram at 4054yor earlier if indicated Pap smears as indicated  Meds:  Meds ordered this encounter  Medications   norgestimate-ethinyl estradiol (ORTHO-CYCLEN) 0.25-35 MG-MCG tablet    Sig: Take 1 tablet by mouth daily.    Dispense:  84 tablet    Refill:  4  Order Specific Question:   Supervising Provider    Answer:   Florian Buff [[id number]]    Follow-up: No follow-ups on file.   No orders of the defined types were placed in this encounter.      Rosita for [employer], [employer] 04/17/2021 4:48 PM

## 2021-04-18 ENCOUNTER — Inpatient Hospital Stay (HOSPITAL_COMMUNITY): Admit: 2021-04-18 | Payer: Self-pay

## 2021-06-15 ENCOUNTER — Encounter (HOSPITAL_COMMUNITY): Payer: Self-pay

## 2021-06-15 ENCOUNTER — Ambulatory Visit (HOSPITAL_COMMUNITY)
Admission: EM | Admit: 2021-06-15 | Discharge: 2021-06-15 | Disposition: A | Discharge status: Home / Self Care | Payer: BC Managed Care – PPO | Attending: Emergency Medicine | Admitting: Emergency Medicine

## 2021-06-15 DIAGNOSIS — J029 Acute pharyngitis, unspecified: Secondary | ICD-10-CM | POA: Diagnosis not present

## 2021-06-15 LAB — POCT RAPID STREP A, ED / UC: Streptococcus, Group A Screen (Direct): NEGATIVE

## 2021-06-15 NOTE — Discharge Instructions (Addendum)
Your strep culture was negative- this is likely a viral infection- you had dandruff but no signs of lice-drink plenty of fluids and take Motrin or Tylenol if needed for throat pain salt water gargles will also help ?

## 2021-06-15 NOTE — ED Triage Notes (Signed)
Pt presents with c/o cough, congestion and sore throat x 1 week.  

## 2021-06-15 NOTE — ED Provider Notes (Signed)
?[name] ? ? ?MRN: [id number] DOB: 10-09-1995 ? ?Subjective:  ? ?Chief Complaint;  ?Chief Complaint  ?Patient presents with  ? Cough  ? Nasal Congestion  ? Sore Throat  ? ? ?[name] is a 26 y.o. female presenting for sore throat cough and congestion since exposure to her nephew that was positive for strep. ? ?No current facility-administered medications for this encounter. ? ?Current Outpatient Medications:  ?  acyclovir (ZOVIRAX) 400 MG tablet, Take 1 tablet (400 mg total) by mouth 3 (three) times daily., Disp: 90 tablet, Rfl: 3 ?  aspirin 81 MG EC tablet, Take 1 tablet (81 mg total) by mouth daily. Swallow whole., Disp: 90 tablet, Rfl: 3 ?  Blood Pressure Monitor MISC, For regular home bp monitoring during pregnancy, Disp: 1 each, Rfl: 0 ?  ferrous sulfate 325 (65 FE) MG tablet, Take 1 tablet (325 mg total) by mouth every other day. (Patient not taking: Reported on 04/17/2021), Disp: 45 tablet, Rfl: 2 ?  norgestimate-ethinyl estradiol (ORTHO-CYCLEN) 0.25-35 MG-MCG tablet, Take 1 tablet by mouth daily., Disp: 84 tablet, Rfl: 4 ?  omeprazole (PRILOSEC) 20 MG capsule, Take 1 capsule (20 mg total) by mouth daily. 1 tablet a day (Patient taking differently: Take 20 mg by mouth daily.), Disp: 30 capsule, Rfl: 6 ?  Prenatal Vit-Fe Fumarate-FA (MULTIVITAMIN-PRENATAL) 27-0.8 MG TABS tablet, Take 1 tablet by mouth daily at 12 noon., Disp: , Rfl:   ? ?Allergies  ?Allergen Reactions  ? Pineapple Itching and Swelling  ? Meloxicam Rash  ? ? ?History reviewed. No pertinent past medical history.  ? ?Review of Systems  ?All other systems reviewed and are negative. ? ? ?Objective:  ? ?Vitals: ?BP 92/65 (BP Location: Left Arm)   Pulse 92   Temp 98.1 ?F (36.7 ?C) (Oral)   Resp 16   LMP 06/08/2021 (Approximate)   SpO2 100%  ? ?Physical Exam ?Vitals and nursing note reviewed.  ?Constitutional:   ?   General: She is not in acute distress. ?   Appearance: She is well-developed.  ?HENT:  ?   Head:  Normocephalic and atraumatic.  ?   Right Ear: Tympanic membrane and ear canal normal.  ?   Left Ear: Tympanic membrane and ear canal normal.  ?   Mouth/Throat:  ?   Mouth: No oral lesions.  ?   Pharynx: Pharyngeal swelling, oropharyngeal exudate and posterior oropharyngeal erythema present. No uvula swelling.  ?Eyes:  ?   Conjunctiva/sclera: Conjunctivae normal.  ?Cardiovascular:  ?   Rate and Rhythm: Normal rate and regular rhythm.  ?   Heart sounds: No murmur heard. ?Pulmonary:  ?   Effort: Pulmonary effort is normal. No respiratory distress.  ?   Breath sounds: Normal breath sounds.  ?Abdominal:  ?   Palpations: Abdomen is soft.  ?   Tenderness: There is no abdominal tenderness.  ?Musculoskeletal:     ?   General: No swelling.  ?   Cervical back: Neck supple.  ?Skin: ?   General: Skin is warm and dry.  ?   Capillary Refill: Capillary refill takes less than 2 seconds.  ?Neurological:  ?   Mental Status: She is alert.  ?Psychiatric:     ?   Mood and Affect: Mood normal.  ? ? ?Results for orders placed or performed during the hospital encounter of 06/15/21 (from the past 24 hour(s))  ?POCT Rapid Strep A     Status: None  ? Collection Time: 06/15/21  6:17 PM  ?Result Value Ref Range  ? Streptococcus, Group A Screen (Direct) NEGATIVE NEGATIVE  ? ? ?No results found.  ?  ? ?Assessment and Plan :  ? ?1. Pharyngitis, unspecified etiology   ? ? ?No orders of the defined types were placed in this encounter. ? ? ?MDM:  ?Mary Singleton is a 26 y.o. female presenting for sore throat and congestion.  Strep culture was negative.  She also brought her twins and both tested negative for strep as well.  This is likely a viral infection.  Patient is encouraged to drink plenty of water salt water gargles.  I discussed treatment, follow up and return instructions. Questions were answered. Patient stated understanding of instructions and is stable for discharge. ?Mary Conger FNP-C MCN  ?  ?[name], NP ?06/15/21 1907 ? ?

## 2021-07-17 ENCOUNTER — Encounter: Payer: Self-pay | Admitting: Women's Health

## 2022-07-08 ENCOUNTER — Other Ambulatory Visit: Payer: Self-pay | Admitting: Advanced Practice Midwife

## 2022-07-10 ENCOUNTER — Ambulatory Visit (INDEPENDENT_AMBULATORY_CARE_PROVIDER_SITE_OTHER): Payer: BC Managed Care – PPO | Admitting: Adult Health

## 2022-07-10 ENCOUNTER — Encounter: Payer: Self-pay | Admitting: Adult Health

## 2022-07-10 VITALS — BP 92/63 | HR 88 | Ht 59.0 in | Wt 89.0 lb

## 2022-07-10 DIAGNOSIS — N941 Unspecified dyspareunia: Secondary | ICD-10-CM | POA: Insufficient documentation

## 2022-07-10 DIAGNOSIS — Z3202 Encounter for pregnancy test, result negative: Secondary | ICD-10-CM | POA: Insufficient documentation

## 2022-07-10 DIAGNOSIS — K5900 Constipation, unspecified: Secondary | ICD-10-CM | POA: Insufficient documentation

## 2022-07-10 DIAGNOSIS — R300 Dysuria: Secondary | ICD-10-CM | POA: Diagnosis not present

## 2022-07-10 DIAGNOSIS — R350 Frequency of micturition: Secondary | ICD-10-CM | POA: Insufficient documentation

## 2022-07-10 DIAGNOSIS — Z3041 Encounter for surveillance of contraceptive pills: Secondary | ICD-10-CM

## 2022-07-10 LAB — POCT URINALYSIS DIPSTICK
Blood, UA: NEGATIVE
Glucose, UA: NEGATIVE
Ketones, UA: NEGATIVE
Leukocytes, UA: NEGATIVE
Nitrite, UA: NEGATIVE
Protein, UA: NEGATIVE

## 2022-07-10 LAB — POCT URINE PREGNANCY: Preg Test, Ur: NEGATIVE

## 2022-07-10 MED ORDER — SULFAMETHOXAZOLE-TRIMETHOPRIM 800-160 MG PO TABS: 1.0000 | ORAL_TABLET | Freq: Two times a day (BID) | ORAL | 0 refills | Status: DC

## 2022-07-10 MED ORDER — POLYETHYLENE GLYCOL 3350 17 G PO PACK: 17.0000 g | PACK | Freq: Every day | ORAL | 99 refills | Status: DC

## 2022-07-10 MED ORDER — NORGESTIMATE-ETH ESTRADIOL 0.25-35 MG-MCG PO TABS: 1.0000 | ORAL_TABLET | Freq: Every day | ORAL | 4 refills | Status: DC

## 2022-07-10 MED ORDER — PHENAZOPYRIDINE HCL 200 MG PO TABS: 200.0000 mg | ORAL_TABLET | Freq: Three times a day (TID) | ORAL | 0 refills | Status: DC | PRN

## 2022-07-10 NOTE — Progress Notes (Signed)
Subjective:     Patient ID: Mary Singleton, female   DOB: [birth year], 27 y.o.   MRN: [id number]  HPI Mary Singleton is a 27 year old white female,single, G1P0102 in complaining of burning with urination and frequency.  Last pap was negative 10/11/20.  Review of Systems +burning with urination +urinary frequency +pain with sex at entrance +constipation, may go once a week Reviewed past medical,surgical, social and family history. Reviewed medications and allergies.      Objective:   Physical Exam BP 92/63 (BP Location: Left Arm, Patient Position: Sitting, Cuff Size: Normal)   Pulse 88   Ht 4\' 11"  (1.499 m)   Wt 89 lb (40.4 kg)   LMP 06/26/2022   Breastfeeding No   BMI 17.98 kg/m  UPT is negative  Urine dipstick was negative. Skin warm and dry.Pelvic: external genitalia is normal in appearance no lesions, vagina: pink,urethra has no lesions or masses noted, cervix:smooth and bulbous, uterus: normal size, shape and contour, non tender, no masses felt, adnexa: no masses or tenderness noted. Bladder is non tender and no masses felt.   Fall risk is low  Upstream - 07/10/22 1438       Pregnancy Intention Screening   Does the patient want to become pregnant in the next year? No    Does the patient's partner want to become pregnant in the next year? No    Would the patient like to discuss contraceptive options today? No      Contraception Wrap Up   Current Method Oral Contraceptive    End Method Oral Contraceptive            Examination chaperoned by Malachy Mood LPN  Assessment:     1. Pregnancy examination or test, negative result - POCT urine pregnancy  2. Burning with urination Will send UA C&S to rule out UTI Will rx septra ds and pyridium   Meds ordered this encounter  Medications   sulfamethoxazole-trimethoprim (BACTRIM DS) 800-160 MG tablet    Sig: Take 1 tablet by mouth 2 (two) times daily. Take 1 bid    Dispense:  14 tablet    Refill:  0    Order Specific  Question:   Supervising Provider    Answer:   Despina Hidden, LUTHER H [[id number]]   polyethylene glycol (MIRALAX / GLYCOLAX) 17 g packet    Sig: Take 17 g by mouth daily.    Dispense:  14 each    Refill:  PRN    Order Specific Question:   Supervising Provider    Answer:   Lazaro Arms [[id number]]   norgestimate-ethinyl estradiol (ORTHO-CYCLEN) 0.25-35 MG-MCG tablet    Sig: Take 1 tablet by mouth daily.    Dispense:  84 tablet    Refill:  4    Order Specific Question:   Supervising Provider    Answer:   Duane Lope H [[id number]]   phenazopyridine (PYRIDIUM) 200 MG tablet    Sig: Take 1 tablet (200 mg total) by mouth 3 (three) times daily as needed for pain.    Dispense:  10 tablet    Refill:  0    Order Specific Question:   Supervising Provider    Answer:   Duane Lope H [[id number]]    - POCT Urinalysis Dipstick - Urine Culture - Urinalysis, Routine w reflex microscopic  3. Urinary frequency - POCT Urinalysis Dipstick - Urine Culture - Urinalysis, Routine w reflex microscopic  4. Constipation, unspecified constipation type Goes about once week Will try miralax  Increase water  5. Dyspareunia in female Try good lubricate and partner and self and increase foreplay  6. Encounter for surveillance of contraceptive pills Will refill ortho cyclen, has missed 2 days Start back today and use condoms     Plan:     Return in 1 year for pap and physical

## 2022-07-11 LAB — URINALYSIS, ROUTINE W REFLEX MICROSCOPIC
Bilirubin, UA: NEGATIVE
Glucose, UA: NEGATIVE
Ketones, UA: NEGATIVE
Leukocytes,UA: NEGATIVE
Nitrite, UA: NEGATIVE
Protein,UA: NEGATIVE
RBC, UA: NEGATIVE
Specific Gravity, UA: 1.024 (ref 1.005–1.030)
Urobilinogen, Ur: 0.2 mg/dL (ref 0.2–1.0)
pH, UA: 5.5 (ref 5.0–7.5)

## 2022-07-12 LAB — URINE CULTURE

## 2022-07-17 ENCOUNTER — Other Ambulatory Visit: Payer: Self-pay | Admitting: Adult Health

## 2022-07-17 MED ORDER — PROMETHAZINE HCL 12.5 MG PO TABS: 12.5000 mg | ORAL_TABLET | Freq: Four times a day (QID) | ORAL | 1 refills | Status: DC | PRN

## 2022-07-17 NOTE — Progress Notes (Signed)
Rx phenergan

## 2022-09-06 ENCOUNTER — Encounter: Payer: Self-pay | Admitting: Emergency Medicine

## 2022-09-06 ENCOUNTER — Other Ambulatory Visit: Payer: Self-pay

## 2022-09-06 ENCOUNTER — Ambulatory Visit
Admission: EM | Admit: 2022-09-06 | Discharge: 2022-09-06 | Disposition: A | Discharge status: Home / Self Care | Payer: BC Managed Care – PPO | Attending: Family Medicine | Admitting: Family Medicine

## 2022-09-06 DIAGNOSIS — J3489 Other specified disorders of nose and nasal sinuses: Secondary | ICD-10-CM | POA: Insufficient documentation

## 2022-09-06 DIAGNOSIS — J029 Acute pharyngitis, unspecified: Secondary | ICD-10-CM | POA: Insufficient documentation

## 2022-09-06 LAB — POCT RAPID STREP A (OFFICE): Rapid Strep A Screen: NEGATIVE

## 2022-09-06 MED ORDER — LIDOCAINE VISCOUS HCL 2 % MT SOLN: 10.0000 mL | OROMUCOSAL | 0 refills | Status: DC | PRN

## 2022-09-06 MED ORDER — FLUTICASONE PROPIONATE 50 MCG/ACT NA SUSP: 1.0000 | Freq: Two times a day (BID) | NASAL | 2 refills | Status: DC

## 2022-09-06 NOTE — Discharge Instructions (Signed)
Your strep test was negative today.  I have sent a throat culture to be sure the cause is not bacterial. I am prescribing flonase nasal spray and viscous lidocaine to help with your symptoms.

## 2022-09-06 NOTE — ED Triage Notes (Signed)
Pt reports left sided throat pain and left ear pain x3 days.

## 2022-09-06 NOTE — ED Provider Notes (Signed)
[facility]    CSN: [id number] Arrival date & time: 09/06/22  1519      History   Chief Complaint Chief Complaint  Patient presents with   Sore Throat    HPI Mary Singleton is a 27 y.o. female.   Patient presenting today with 3-day history of left-sided throat pain, runny nose and sinus drainage, left ear pain.  Denies fever, chills, chest pain, shortness of breath, abdominal pain, nausea vomiting or diarrhea.  So far not tried anything over-the-counter for symptoms.    History reviewed. No pertinent past medical history.  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   Dizzy spells 07/17/2018   Gastric outlet obstruction 03/22/2017    Past Surgical History:  Procedure Laterality Date   BIOPSY  08/16/2017   Procedure: BIOPSY;  Surgeon: Malissa Hippo, MD;  Location: [facility];  Service: Endoscopy;;  duodenum   ESOPHAGOGASTRODUODENOSCOPY (EGD) WITH PROPOFOL N/A 08/16/2017   Procedure: ESOPHAGOGASTRODUODENOSCOPY (EGD) WITH PROPOFOL;  Surgeon: Malissa Hippo, MD;  Location: [facility];  Service: Endoscopy;  Laterality: N/A;  8:30   EYE SURGERY      OB History     Gravida  1   Para  1   Term  0   Preterm  1   AB  0   Living  2      SAB  0   IAB  0   Ectopic  0   Multiple  2   Live Births  2            Home Medications    Prior to Admission medications   Medication Sig Start Date End Date Taking? Authorizing Provider  fluticasone (FLONASE) 50 MCG/ACT nasal spray Place 1 spray into both nostrils 2 (two) times daily. 09/06/22  Yes Particia Nearing, PA-C  lidocaine (XYLOCAINE) 2 % solution Use as directed 10 mLs in the mouth or throat every 3 (three) hours as needed for mouth pain. 09/06/22  Yes Particia Nearing, PA-C  ferrous sulfate 325 (65 FE) MG tablet Take 1 tablet (325 mg total) by mouth every other day. 01/31/21   Cheral Marker, CNM  norgestimate-ethinyl estradiol (ORTHO-CYCLEN) 0.25-35 MG-MCG tablet Take 1 tablet by mouth daily. 07/10/22   Adline Potter, NP  phenazopyridine (PYRIDIUM) 200 MG tablet Take 1 tablet (200 mg total) by mouth 3 (three) times daily as needed for pain. 07/10/22   Adline Potter, NP  polyethylene glycol (MIRALAX / GLYCOLAX) 17 g packet Take 17 g by mouth daily. 07/10/22   Adline Potter, NP  promethazine (PHENERGAN) 12.5 MG tablet Take 1 tablet (12.5 mg total) by mouth every 6 (six) hours as needed. 07/17/22   Adline Potter, NP  sulfamethoxazole-trimethoprim (BACTRIM DS) 800-160 MG tablet Take 1 tablet by mouth 2 (two) times daily. Take 1 bid 07/10/22   Adline Potter, NP  clonazePAM Scarlette Calico) 0.5 MG tablet  04/09/18 04/06/19  [provider]    Family History Family History  Problem Relation Age of Onset   Cancer Maternal Grandmother    Healthy Mother  Social History Social History   Tobacco Use   Smoking status: Never   Smokeless tobacco: Never  Vaping Use   Vaping Use: Never used  Substance Use Topics   Alcohol use: No   Drug use: No     Allergies   Pineapple and Meloxicam   Review of Systems Review of Systems Per HPI  Physical Exam Triage Vital Signs ED Triage Vitals  Enc Vitals Group     BP 09/06/22 1528 97/64     Pulse Rate 09/06/22 1528 94     Resp 09/06/22 1528 20     Temp 09/06/22 1528 98.2 F (36.8 C)     Temp Source 09/06/22 1528 Oral     SpO2 09/06/22 1528 97 %     Weight --      Height --      Head Circumference --      Peak Flow --      Pain Score 09/06/22 1526 7     Pain Loc --      Pain Edu? --      Excl. in  GC? --    No data found.  Updated Vital Signs BP 97/64 (BP Location: Right Arm)   Pulse 94   Temp 98.2 F (36.8 C) (Oral)   Resp 20   LMP 08/26/2022 (Approximate)   SpO2 97%   Breastfeeding No   Visual Acuity Right Eye Distance:   Left Eye Distance:   Bilateral Distance:    Right Eye Near:   Left Eye Near:    Bilateral Near:     Physical Exam Vitals and nursing note reviewed.  Constitutional:      Appearance: Normal appearance. She is not ill-appearing.  HENT:     Head: Atraumatic.     Nose: Rhinorrhea present.     Mouth/Throat:     Mouth: Mucous membranes are moist.     Pharynx: Posterior oropharyngeal erythema present. No oropharyngeal exudate.  Eyes:     Extraocular Movements: Extraocular movements intact.     Conjunctiva/sclera: Conjunctivae normal.  Cardiovascular:     Rate and Rhythm: Normal rate and regular rhythm.     Heart sounds: Normal heart sounds.  Pulmonary:     Effort: Pulmonary effort is normal.     Breath sounds: Normal breath sounds.  Musculoskeletal:        General: Normal range of motion.     Cervical back: Normal range of motion and neck supple.  Lymphadenopathy:     Cervical: No cervical adenopathy.  Skin:    General: Skin is warm and dry.  Neurological:     Mental Status: She is alert and oriented to person, place, and time.  Psychiatric:        Mood and Affect: Mood normal.        Thought Content: Thought content normal.        Judgment: Judgment normal.      UC Treatments / Results  Labs (all labs ordered are listed, but only abnormal results are displayed) Labs Reviewed  CULTURE, GROUP A STREP [facility])  POCT RAPID STREP A (OFFICE)    EKG   Radiology No results found.  Procedures Procedures (including critical care time)  Medications Ordered in UC Medications - No data to display  Initial Impression / Assessment and Plan / UC Course  I have reviewed the triage vital signs and the nursing notes.  Pertinent labs &  imaging results that were available during my care of the patient  were reviewed by me and considered in my medical decision making (see chart for details).     Vital exam reassuring today, rapid strep negative, throat culture pending.  Treat with Flonase, viscous lidocaine, supportive over-the-counter medications and home care.  Suspect viral versus allergic cause.  Return for worsening symptoms.  Final Clinical Impressions(s) / UC Diagnoses   Final diagnoses:  Acute pharyngitis, unspecified etiology  Rhinorrhea     Discharge Instructions      Your strep test was negative today.  I have sent a throat culture to be sure the cause is not bacterial. I am prescribing flonase nasal spray and viscous lidocaine to help with your symptoms.     ED Prescriptions     Medication Sig Dispense Auth. Provider   fluticasone (FLONASE) 50 MCG/ACT nasal spray Place 1 spray into both nostrils 2 (two) times daily. 16 g Particia Nearing, PA-C   lidocaine (XYLOCAINE) 2 % solution Use as directed 10 mLs in the mouth or throat every 3 (three) hours as needed for mouth pain. 100 mL Particia Nearing, [place]      PDMP not reviewed this encounter.   Particia Nearing, [place] 09/06/22 1646

## 2022-09-09 LAB — CULTURE, GROUP A STREP (THRC)

## 2022-12-28 ENCOUNTER — Encounter: Payer: Self-pay | Admitting: Adult Health

## 2022-12-28 ENCOUNTER — Ambulatory Visit: Payer: BC Managed Care – PPO | Admitting: Adult Health

## 2022-12-28 VITALS — BP 100/76 | HR 94 | Ht 59.0 in | Wt 90.0 lb

## 2022-12-28 DIAGNOSIS — Z3202 Encounter for pregnancy test, result negative: Secondary | ICD-10-CM | POA: Insufficient documentation

## 2022-12-28 DIAGNOSIS — N644 Mastodynia: Secondary | ICD-10-CM | POA: Insufficient documentation

## 2022-12-28 DIAGNOSIS — Z3200 Encounter for pregnancy test, result unknown: Secondary | ICD-10-CM | POA: Insufficient documentation

## 2022-12-28 LAB — POCT URINE PREGNANCY: Preg Test, Ur: NEGATIVE

## 2022-12-28 NOTE — Progress Notes (Signed)
Subjective:     Patient ID: Mary Singleton, female   DOB: 1995/05/29, 27 y.o.   MRN: [id number]  HPI Mary Singleton is a 27 year old white female, single, G1P0102, in complaining of pain in left breast. She says she had 2HPTs, but had period last week, and she did miss a week of BCP.  Last pap was negative 10/11/20  Review of Systems +left breast pain 2+HPTs, missed week of pills, had period last week  Reviewed past medical,surgical, social and family history. Reviewed medications and allergies.     Objective:   Physical Exam BP 100/76 (BP Location: Left Arm, Patient Position: Sitting, Cuff Size: Normal)   Pulse 94   Ht 4\' 11"  (1.499 m)   Wt 90 lb (40.8 kg)   LMP 12/19/2022   Breastfeeding No   BMI 18.18 kg/m  UPT is negative   Skin warm and dry,  Breasts:no dominate palpable mass, retraction or nipple discharge +tenderness UOQ on left  Fall risk is low  Upstream - 12/28/22 6578       Pregnancy Intention Screening   Does the patient want to become pregnant in the next year? No    Does the patient's partner want to become pregnant in the next year? No    Would the patient like to discuss contraceptive options today? No      Contraception Wrap Up   Current Method Oral Contraceptive    End Method Oral Contraceptive    Contraception Counseling Provided No                Assessment:     1. Possible pregnancy UPT is negative  - POCT urine pregnancy Offered QHCG she declines for now, but if not period next month will get one then she says  2. Negative pregnancy test UPT is negative - POCT urine pregnancy  3. Breast pain, left Can take tylenol or advil and alternate heat and ice     Plan:     Return about October 14, 2023 for pap and physical or sooner if needed

## 2023-01-02 ENCOUNTER — Emergency Department (HOSPITAL_COMMUNITY): Payer: BC Managed Care – PPO

## 2023-01-02 ENCOUNTER — Emergency Department (HOSPITAL_COMMUNITY)
Admission: EM | Admit: 2023-01-02 | Discharge: 2023-01-03 | Disposition: A | Discharge status: Home / Self Care | Payer: BC Managed Care – PPO | Attending: Emergency Medicine | Admitting: Emergency Medicine

## 2023-01-02 ENCOUNTER — Encounter (HOSPITAL_COMMUNITY): Payer: Self-pay

## 2023-01-02 DIAGNOSIS — E876 Hypokalemia: Secondary | ICD-10-CM | POA: Insufficient documentation

## 2023-01-02 DIAGNOSIS — N83201 Unspecified ovarian cyst, right side: Secondary | ICD-10-CM | POA: Diagnosis not present

## 2023-01-02 DIAGNOSIS — R1031 Right lower quadrant pain: Secondary | ICD-10-CM | POA: Diagnosis present

## 2023-01-02 LAB — URINALYSIS, ROUTINE W REFLEX MICROSCOPIC
Bacteria, UA: NONE SEEN
Bilirubin Urine: NEGATIVE
Glucose, UA: NEGATIVE mg/dL
Hgb urine dipstick: NEGATIVE
Ketones, ur: 20 mg/dL — AB
Leukocytes,Ua: NEGATIVE
Nitrite: NEGATIVE
Protein, ur: 30 mg/dL — AB
Specific Gravity, Urine: 1.027 (ref 1.005–1.030)
pH: 6 (ref 5.0–8.0)

## 2023-01-02 LAB — COMPREHENSIVE METABOLIC PANEL
ALT: 18 U/L (ref 0–44)
AST: 20 U/L (ref 15–41)
Albumin: 4.2 g/dL (ref 3.5–5.0)
Alkaline Phosphatase: 57 U/L (ref 38–126)
Anion gap: 9 (ref 5–15)
BUN: 10 mg/dL (ref 6–20)
CO2: 22 mmol/L (ref 22–32)
Calcium: 9.2 mg/dL (ref 8.9–10.3)
Chloride: 105 mmol/L (ref 98–111)
Creatinine, Ser: 0.56 mg/dL (ref 0.44–1.00)
GFR, Estimated: >60 mL/min (ref >60)
Glucose, Bld: 99 mg/dL (ref 70–99)
Potassium: 2.7 mmol/L — CL (ref 3.5–5.1)
Sodium: 136 mmol/L (ref 135–145)
Total Bilirubin: 0.7 mg/dL (ref 0.3–1.2)
Total Protein: 7.5 g/dL (ref 6.5–8.1)

## 2023-01-02 LAB — CBC
HCT: 37 % (ref 36.0–46.0)
Hemoglobin: 12.1 g/dL (ref 12.0–15.0)
MCH: 28.9 pg (ref 26.0–34.0)
MCHC: 32.7 g/dL (ref 30.0–36.0)
MCV: 88.3 fL (ref 80.0–100.0)
Platelets: 239 10*3/uL (ref 150–400)
RBC: 4.19 MIL/uL (ref 3.87–5.11)
RDW: 12.9 % (ref 11.5–15.5)
WBC: 7.3 10*3/uL (ref 4.0–10.5)
nRBC: 0 % (ref 0.0–0.2)

## 2023-01-02 LAB — LIPASE, BLOOD: Lipase: 36 U/L (ref 11–51)

## 2023-01-02 MED ORDER — IOHEXOL 300 MG/ML  SOLN
100.0000 mL | Freq: Once | INTRAMUSCULAR | Status: AC | PRN
  Administered 2023-01-03: 100 mL via INTRAVENOUS

## 2023-01-02 MED ORDER — SODIUM CHLORIDE 0.9 % IV BOLUS
1000.0000 mL | Freq: Once | INTRAVENOUS | Status: AC
  Administered 2023-01-03: 1000 mL via INTRAVENOUS

## 2023-01-02 MED ORDER — POTASSIUM CHLORIDE 10 MEQ/100ML IV SOLN
10.0000 meq | INTRAVENOUS | Status: AC
  Administered 2023-01-03 (×3): 10 meq via INTRAVENOUS
  Filled 2023-01-02 (×3): qty 100

## 2023-01-02 MED ORDER — ONDANSETRON HCL 4 MG/2ML IJ SOLN
4.0000 mg | Freq: Once | INTRAMUSCULAR | Status: AC
  Administered 2023-01-03: 4 mg via INTRAVENOUS
  Filled 2023-01-02: qty 2

## 2023-01-02 NOTE — ED Triage Notes (Signed)
Pt states that she has been having RLQ pain pain with nausea for the past 5 days, denies fevers, denies constipation, denies dysuria or abnormal discharge.

## 2023-01-02 NOTE — ED Provider Notes (Signed)
[place] EMERGENCY DEPARTMENT AT [facility] Provider Note   CSN: [id number] Arrival date & time: 01/02/23  1932     History {Add pertinent medical, surgical, social history, OB history to HPI:1} Chief Complaint  Patient presents with   Abdominal Pain    AMELA BISSEN is a 27 y.o. female.  The history is provided by the patient.  Abdominal Pain She complains of right lower quadrant abdominal pain for last 5 days.  There was associated nausea and vomiting left first 2 days.  Since then, she has just had nausea without vomiting.  She denies fever or chills.  She denies any urinary difficulty.  Last menses was 2 weeks ago and was normal.  She went to urgent care where there was concern about her being dehydrated and she was referred here.   Home Medications Prior to Admission medications   Medication Sig Start Date End Date Taking? Authorizing Provider  fluticasone (FLONASE) 50 MCG/ACT nasal spray Place 1 spray into both nostrils 2 (two) times daily. Patient not taking: Reported on 12/28/2022 09/06/22   Particia Nearing, PA-C  lidocaine (XYLOCAINE) 2 % solution Use as directed 10 mLs in the mouth or throat every 3 (three) hours as needed for mouth pain. Patient not taking: Reported on 12/28/2022 09/06/22   Particia Nearing, PA-C  norgestimate-ethinyl estradiol (ORTHO-CYCLEN) 0.25-35 MG-MCG tablet Take 1 tablet by mouth daily. 07/10/22   Adline Potter, NP  phenazopyridine (PYRIDIUM) 200 MG tablet Take 1 tablet (200 mg total) by mouth 3 (three) times daily as needed for pain. Patient not taking: Reported on 12/28/2022 07/10/22   Cyril Mourning A, NP  polyethylene glycol (MIRALAX / GLYCOLAX) 17 g packet Take 17 g by mouth daily. Patient not taking: Reported on 12/28/2022 07/10/22   Adline Potter, NP  promethazine (PHENERGAN) 12.5 MG tablet Take 1 tablet (12.5 mg total) by mouth every 6 (six) hours as needed. Patient not taking: Reported on 12/28/2022  07/17/22   Adline Potter, NP  clonazePAM Scarlette Calico) 0.5 MG tablet  04/09/18 04/06/19  [provider]      Allergies    Pineapple and Meloxicam    Review of Systems   Review of Systems  Gastrointestinal:  Positive for abdominal pain.  All other systems reviewed and are negative.   Physical Exam Updated Vital Signs BP 104/64   Pulse 67   Temp 99.4 F (37.4 C) (Oral)   Resp 18   LMP 12/19/2022   SpO2 100%  Physical Exam Vitals and nursing note reviewed.   27 year old female, resting comfortably and in no acute distress. Vital signs are normal. Oxygen saturation is 100%, which is normal. Head is normocephalic and atraumatic. PERRLA, EOMI. Oropharynx is clear. Neck is nontender and supple. Back is nontender and there is no CVA tenderness. Lungs are clear without rales, wheezes, or rhonchi. Chest is nontender. Heart has regular rate and rhythm without murmur. Abdomen is soft, flat, with moderate tenderness in the right mid abdomen.  There is no rebound or guarding, but there is tenderness to percussion.  Peristalsis is hypoactive. Extremities have no cyanosis or edema, full range of motion is present. Skin is warm and dry without rash. Neurologic: Mental status is normal, cranial nerves are intact, moves all extremities equally.  ED Results / Procedures / Treatments   Labs (all labs ordered are listed, but only abnormal results are displayed) Labs Reviewed  COMPREHENSIVE METABOLIC PANEL - Abnormal; Notable for the following components:  Result Value   Potassium 2.7 (*)    All other components within normal limits  URINALYSIS, ROUTINE W REFLEX MICROSCOPIC - Abnormal; Notable for the following components:   Ketones, ur 20 (*)    Protein, ur 30 (*)    All other components within normal limits  LIPASE, BLOOD  CBC  MAGNESIUM  POC URINE PREG, ED    EKG None  Radiology No results found.  Procedures Procedures  {Document cardiac monitor, telemetry  assessment procedure when appropriate:1}  Medications Ordered in ED Medications  sodium chloride 0.9 % bolus 1,000 mL (has no administration in time range)  potassium chloride 10 mEq in 100 mL IVPB (has no administration in time range)  ondansetron (ZOFRAN) injection 4 mg (has no administration in time range)  iohexol (OMNIPAQUE) 300 MG/ML solution 100 mL (has no administration in time range)    ED Course/ Medical Decision Making/ A&P   {   Click here for ABCD2, HEART and other calculatorsREFRESH Note before signing :1}                              Medical Decision Making Amount and/or Complexity of Data Reviewed Labs: ordered. Radiology: ordered.  Risk Prescription drug management.   Right lower quadrant pain concerning for appendicitis.  Differential diagnosis includes, but is not limited to, ovarian cyst, mittelschmerz, diverticulitis, pyelonephritis, cholecystitis, pancreatitis.  This differential includes conditions with significant risk for morbidity and complications.'s, and my interpretation is moderate to severe hypokalemia likely secondary to GI losses, otherwise normal comprehensive metabolic panel, normal lipase, normal CBC, urinalysis significant for ketonuria and proteinuria.  I have ordered IV fluids and IV potassium.  I have ordered CT of abdomen and pelvis to evaluate for possible appendicitis.  {Document critical care time when appropriate:1} {Document review of labs and clinical decision tools ie heart score, Chads2Vasc2 etc:1}  {Document your independent review of radiology images, and any outside records:1} {Document your discussion with family members, caretakers, and with consultants:1} {Document social determinants of health affecting pt's care:1} {Document your decision making why or why not admission, treatments were needed:1} Final Clinical Impression(s) / ED Diagnoses Final diagnoses:  None    Rx / DC Orders ED Discharge Orders     None

## 2023-01-03 DIAGNOSIS — N83201 Unspecified ovarian cyst, right side: Secondary | ICD-10-CM | POA: Diagnosis not present

## 2023-01-03 LAB — MAGNESIUM: Magnesium: 2.1 mg/dL (ref 1.7–2.4)

## 2023-01-03 MED ORDER — ONDANSETRON 8 MG PO TBDP: ORAL_TABLET | ORAL | 0 refills | Status: DC

## 2023-01-03 MED ORDER — POTASSIUM CHLORIDE ER 10 MEQ PO TBCR: 20.0000 meq | EXTENDED_RELEASE_TABLET | Freq: Two times a day (BID) | ORAL | 0 refills | Status: DC

## 2023-01-03 MED ORDER — POTASSIUM CHLORIDE CRYS ER 20 MEQ PO TBCR
40.0000 meq | EXTENDED_RELEASE_TABLET | Freq: Once | ORAL | Status: DC
  Filled 2023-01-03: qty 2

## 2023-01-03 NOTE — Discharge Instructions (Addendum)
Your evaluation did not show any sign of anything serious causing your abdominal pain.  The biggest concern was your low potassium.  Please take the potassium pills as prescribed until they are all gone.  Return if your symptoms are getting worse.

## 2023-03-06 NOTE — L&D Delivery Note (Signed)
 Delivery Note ROM x 19 hours. At 7:20 PM a viable and healthy female was delivered via Vaginal, Spontaneous (Presentation: Left Occiput Anterior).  30 second delay from delivery of head to delivery of body due to compound left hand.  No difficulty with delivery of shoulders.  Infant placed skin to skin with mom.  Delayed cord clamping x 1.  Cord clamped x 2 and cut by FOB.  APGAR: 7, 9; weight 4 lb 14.7 oz (2230 g).   Placenta status: Spontaneous, Intact.  Cord: 3 vessels with the following complications: None.  Cord pH: N/A  Anesthesia: Epidural Episiotomy: None Lacerations: None Suture Repair: N/A.  Intact. Est. Blood Loss (mL): 119 cc  Mom to postpartum.  Baby to Couplet care / Skin to Skin.  Confirmed patient's desire for BTL.  Reminded patient again that it should be considered a permanent procedure.  Questions addressed patient verbalized understanding and would like BTL tomorrow.  Will make her n.p.o. after midnight.  Marcello Tuzzolino  Mary Singleton 03/01/2024, 8:30 PM

## 2023-03-22 ENCOUNTER — Ambulatory Visit: Payer: 59 | Admitting: Obstetrics & Gynecology

## 2023-04-01 ENCOUNTER — Ambulatory Visit (INDEPENDENT_AMBULATORY_CARE_PROVIDER_SITE_OTHER): Payer: 59 | Admitting: Obstetrics & Gynecology

## 2023-04-01 ENCOUNTER — Encounter: Payer: Self-pay | Admitting: Obstetrics & Gynecology

## 2023-04-01 VITALS — BP 106/72 | HR 93 | Wt 94.4 lb

## 2023-04-01 DIAGNOSIS — A609 Anogenital herpesviral infection, unspecified: Secondary | ICD-10-CM

## 2023-04-01 DIAGNOSIS — Z30016 Encounter for initial prescription of transdermal patch hormonal contraceptive device: Secondary | ICD-10-CM | POA: Diagnosis not present

## 2023-04-01 MED ORDER — VALACYCLOVIR HCL 500 MG PO TABS: 500.0000 mg | ORAL_TABLET | Freq: Every day | ORAL | 3 refills | Status: AC

## 2023-04-01 MED ORDER — VALACYCLOVIR HCL 500 MG PO TABS: 500.0000 mg | ORAL_TABLET | Freq: Two times a day (BID) | ORAL | 0 refills | Status: AC

## 2023-04-01 MED ORDER — NORELGESTROMIN-ETH ESTRADIOL 150-35 MCG/24HR TD PTWK: 1.0000 | MEDICATED_PATCH | TRANSDERMAL | 4 refills | Status: DC

## 2023-04-01 NOTE — Progress Notes (Signed)
   GYN VISIT Patient name: Mary Singleton MRN [id number]  Date of birth: 12/06/1995 Chief Complaint:   HSV outbreak  History of Present Illness:   Mary Singleton is a 28 y.o. 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  Notes some burning and did not have a prescription refill.  Prior to this, her last outbreak was about 8 mos ago.  Partner present and also has outbreak  Contraceptive management: Stopped pill about a month ago.  Notes she stopped taking the pill due to worsening cramping.  LMP: last week   Review of Systems:   Pertinent items are noted in HPI Denies fever/chills, dizziness, headaches, visual disturbances, fatigue, shortness of breath, chest pain, abdominal pain, vomiting, no problems with bowel movements, urination, or intercourse unless otherwise stated above.  Pertinent History Reviewed:   Past Surgical History:  Procedure Laterality Date   BIOPSY  08/16/2017   Procedure: BIOPSY;  Surgeon: Malissa Hippo, MD;  [facility]: [facility];  Service: Endoscopy;;  duodenum   ESOPHAGOGASTRODUODENOSCOPY (EGD) WITH PROPOFOL N/A 08/16/2017   Procedure: ESOPHAGOGASTRODUODENOSCOPY (EGD) WITH PROPOFOL;  Surgeon: Malissa Hippo, MD;  [facility]: [facility];  Service: Endoscopy;  Laterality: N/A;  8:30   EYE SURGERY      History reviewed. No pertinent past medical history. Reviewed problem list, medications and allergies. Physical Assessment:   Vitals:   04/01/23 1404  BP: 106/72  Pulse: 93  Weight: 94 lb 6.4 oz (42.8 kg)  Body mass index is 19.07 kg/m.       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  Meds ordered this encounter  Medications   norelgestromin-ethinyl estradiol Burr Medico) 150-35 MCG/24HR transdermal patch    Sig: Place 1 patch onto the skin once a week.    Dispense:  12 patch    Refill:  4   valACYclovir (VALTREX) 500 MG tablet    Sig: Take 1 tablet (500 mg total) by mouth 2 (two) times daily for 3 days.    Dispense:  6 tablet    Refill:  0   valACYclovir (VALTREX) 500 MG tablet    Sig: Take 1 tablet (500 mg total) by mouth daily.    Dispense:  90 tablet    Refill:  3    Please explain to patient to complete twice daily for treatment then start suppression therapy after      Myna Hidalgo, DO Attending Obstetrician & Gynecologist, [facility] [employer], [facility]

## 2023-07-22 ENCOUNTER — Ambulatory Visit (INDEPENDENT_AMBULATORY_CARE_PROVIDER_SITE_OTHER)

## 2023-07-22 VITALS — BP 102/68 | Wt 95.8 lb

## 2023-07-22 DIAGNOSIS — Z3201 Encounter for pregnancy test, result positive: Secondary | ICD-10-CM | POA: Diagnosis not present

## 2023-07-22 DIAGNOSIS — Z3200 Encounter for pregnancy test, result unknown: Secondary | ICD-10-CM

## 2023-07-22 LAB — POCT URINE PREGNANCY: Preg Test, Ur: POSITIVE — AB

## 2023-07-22 NOTE — Progress Notes (Signed)
   NURSE VISIT- PREGNANCY CONFIRMATION   SUBJECTIVE:  Mary Singleton is a 28 y.o. [phone] female at Unknown by certain LMP of Patient's last menstrual period was 06/15/2023 (approximate). Here for pregnancy confirmation.  Home pregnancy test: positive x 2  She reports no complaints.  She is not taking prenatal vitamins.    OBJECTIVE:  BP 102/68   Wt 95 lb 12.8 oz (43.5 kg)   LMP 06/15/2023 (Approximate)   BMI 19.35 kg/m   Appears well, in no apparent distress  Results for orders placed or performed in visit on 07/22/23 (from the past 24 hours)  POCT urine pregnancy   Collection Time: 07/22/23  3:09 PM  Result Value Ref Range   Preg Test, Ur Positive (A) Negative    ASSESSMENT: Positive pregnancy test, Unknown by LMP    PLAN: Schedule for dating ultrasound in 2 week Prenatal vitamins: plans to begin OTC ASAP   Nausea medicines: not currently needed   OB packet given: Yes  Alyssa Jumper  07/22/2023 3:10 PM

## 2023-07-30 ENCOUNTER — Other Ambulatory Visit: Payer: Self-pay | Admitting: Obstetrics & Gynecology

## 2023-07-30 DIAGNOSIS — O3680X0 Pregnancy with inconclusive fetal viability, not applicable or unspecified: Secondary | ICD-10-CM

## 2023-08-05 ENCOUNTER — Ambulatory Visit (INDEPENDENT_AMBULATORY_CARE_PROVIDER_SITE_OTHER): Admitting: Radiology

## 2023-08-05 DIAGNOSIS — O3680X0 Pregnancy with inconclusive fetal viability, not applicable or unspecified: Secondary | ICD-10-CM | POA: Diagnosis not present

## 2023-08-05 DIAGNOSIS — Z3A01 Less than 8 weeks gestation of pregnancy: Secondary | ICD-10-CM | POA: Diagnosis not present

## 2023-08-05 NOTE — Progress Notes (Signed)
 US :  [redacted]w[redacted]d by LMP:  06/15/23 Retroverted uterus with early single viable early IUP.  GS intact within upper mid uterine cavity CRL = 10.8mm =  7+1weeks,  FHR = 151bpm, Yolk Sac = 3.96mm Normal Rt ov, L ov not seen  - Neg adnexal regions  neg CDS - no free fluid present EDD by LMP = 03-21-24

## 2023-08-18 ENCOUNTER — Inpatient Hospital Stay (HOSPITAL_COMMUNITY)
Admission: AD | Admit: 2023-08-18 | Discharge: 2023-08-19 | Disposition: A | Discharge status: Home / Self Care | Attending: Obstetrics & Gynecology | Admitting: Obstetrics & Gynecology

## 2023-08-18 ENCOUNTER — Other Ambulatory Visit: Payer: Self-pay

## 2023-08-18 DIAGNOSIS — Z3A09 9 weeks gestation of pregnancy: Secondary | ICD-10-CM | POA: Diagnosis not present

## 2023-08-18 DIAGNOSIS — R1030 Lower abdominal pain, unspecified: Secondary | ICD-10-CM | POA: Diagnosis present

## 2023-08-18 DIAGNOSIS — O26891 Other specified pregnancy related conditions, first trimester: Secondary | ICD-10-CM | POA: Diagnosis not present

## 2023-08-18 DIAGNOSIS — K219 Gastro-esophageal reflux disease without esophagitis: Secondary | ICD-10-CM | POA: Insufficient documentation

## 2023-08-18 DIAGNOSIS — O23591 Infection of other part of genital tract in pregnancy, first trimester: Secondary | ICD-10-CM | POA: Diagnosis not present

## 2023-08-18 DIAGNOSIS — B9689 Other specified bacterial agents as the cause of diseases classified elsewhere: Secondary | ICD-10-CM | POA: Diagnosis not present

## 2023-08-18 DIAGNOSIS — O99611 Diseases of the digestive system complicating pregnancy, first trimester: Secondary | ICD-10-CM | POA: Diagnosis not present

## 2023-08-18 DIAGNOSIS — B3731 Acute candidiasis of vulva and vagina: Secondary | ICD-10-CM | POA: Diagnosis not present

## 2023-08-18 DIAGNOSIS — O98811 Other maternal infectious and parasitic diseases complicating pregnancy, first trimester: Secondary | ICD-10-CM | POA: Diagnosis not present

## 2023-08-18 LAB — URINALYSIS, ROUTINE W REFLEX MICROSCOPIC
Bilirubin Urine: NEGATIVE
Glucose, UA: NEGATIVE mg/dL
Hgb urine dipstick: NEGATIVE
Ketones, ur: NEGATIVE mg/dL
Leukocytes,Ua: NEGATIVE
Nitrite: NEGATIVE
Protein, ur: NEGATIVE mg/dL
Specific Gravity, Urine: 1.021 (ref 1.005–1.030)
pH: 6 (ref 5.0–8.0)

## 2023-08-18 LAB — WET PREP, GENITAL
Sperm: NONE SEEN
Trich, Wet Prep: NONE SEEN
WBC, Wet Prep HPF POC: <10 (ref <10)

## 2023-08-18 MED ORDER — SIMETHICONE 80 MG PO CHEW
160.0000 mg | CHEWABLE_TABLET | Freq: Once | ORAL | Status: AC
  Administered 2023-08-18: 160 mg via ORAL
  Filled 2023-08-18: qty 2

## 2023-08-18 MED ORDER — METOCLOPRAMIDE HCL 10 MG PO TABS
10.0000 mg | ORAL_TABLET | Freq: Once | ORAL | Status: AC
  Administered 2023-08-18: 10 mg via ORAL
  Filled 2023-08-18: qty 1

## 2023-08-18 NOTE — MAU Note (Signed)
 Mary Singleton is a 28 y.o. at [redacted]w[redacted]d here in MAU reporting: feels like someone's squeezing my belly in mid/lower abdomen - on and off all day. Denies VB or abnormal discharge. Reports having diarrhea this morning.   LMP: NA Onset of complaint: all day  Pain score: 5 Vitals:   08/18/23 2139  BP: 105/68  Pulse: (!) 115  Resp: 17  Temp: 98.3 F (36.8 C)  SpO2: 100%     FHT: NA  Lab orders placed from triage: UA

## 2023-08-18 NOTE — MAU Provider Note (Signed)
 None     S [name] is a 28 y.o. [phone] pregnant female at [redacted]w[redacted]d who presents to MAU today with complaint of lower abdominal pain, she reports this pain has been going on all day off and on. Reports some feelings of reflux. Declines blood work. Reports some GI upset this morning with one bout of diarrhea. Reports it feels like gas pain.   Receives care at [facility]. Prenatal records reviewed.  Pertinent items noted in HPI and remainder of comprehensive ROS otherwise negative.   O BP (!) 98/59 (BP Location: Left Arm)   Pulse 78   Temp 98.3 F (36.8 C) (Oral)   Resp 17   Ht 4' 11 (1.499 m)   Wt 43.6 kg   LMP 06/15/2023 (Approximate)   SpO2 100%   BMI 19.43 kg/m  Physical Exam Vitals reviewed.  Constitutional:      Appearance: Normal appearance.  HENT:     Head: Normocephalic.   Cardiovascular:     Rate and Rhythm: Normal rate and regular rhythm.     Pulses: Normal pulses.     Heart sounds: Normal heart sounds.  Pulmonary:     Effort: Pulmonary effort is normal.     Breath sounds: Normal breath sounds.   Skin:    General: Skin is warm and dry.     Capillary Refill: Capillary refill takes less than 2 seconds.   Neurological:     General: No focal deficit present.     Mental Status: She is alert and oriented to person, place, and time.      MDM: Reviewed EMR. Previously seen IUP on ultrasound.  Wet prep and GC swabs collected here: positive for BV and yeast. Trial of Reglan and simethicone  here with good relief.  MAU Course:  A Gastroesophageal reflux disease, unspecified whether esophagitis present - Plan: Discharge patient  Bacterial vaginosis  Candida vaginitis  [redacted] weeks gestation of pregnancy  Medical screening exam complete  P Discharge from MAU in stable condition with routine precautions Follow up at [facility] as scheduled for ongoing prenatal care Metrogel 0.75% PV nightly for 5 nights for bacterial vaginosis. Once Metrogel completed, terconazole  0.4% nightly for 7 nights for vaginal candida. Simethicone  and Reglan for nausea/GERD symptoms.   Allergies as of 08/19/2023       Reactions   Pineapple Itching, Swelling   Meloxicam  Rash        Medication List     STOP taking these medications    norelgestromin -ethinyl estradiol  150-35 MCG/24HR transdermal patch Commonly known as: XULANE       TAKE these medications    metoCLOPramide 10 MG tablet Commonly known as: REGLAN Take 1 tablet (10 mg total) by mouth every 6 (six) hours.   metroNIDAZOLE 0.75 % vaginal gel Commonly known as: METROGEL Place 1 Applicatorful vaginally at bedtime. Apply one applicatorful to vagina at bedtime for 5 days   ondansetron  8 MG disintegrating tablet Commonly known as: ZOFRAN -ODT 8mg  ODT q4 hours prn nausea   potassium chloride  10 MEQ tablet Commonly known as: KLOR-CON  Take 2 tablets (20 mEq total) by mouth 2 (two) times daily.   Simethicone  80 MG Tabs Take 2 tablets (160 mg total) by mouth every 6 (six) hours as needed.   terconazole 0.4 % vaginal cream Commonly known as: TERAZOL 7 Place 1 applicator vaginally at bedtime. Use for seven days. Start after you finish the Metrogel.        Raford Bunk, MSN, CNM 08/19/2023 4:14 AM  Certified Nurse Midwife, [facility]

## 2023-08-19 DIAGNOSIS — O26891 Other specified pregnancy related conditions, first trimester: Secondary | ICD-10-CM

## 2023-08-19 DIAGNOSIS — K219 Gastro-esophageal reflux disease without esophagitis: Secondary | ICD-10-CM

## 2023-08-19 DIAGNOSIS — Z3A09 9 weeks gestation of pregnancy: Secondary | ICD-10-CM

## 2023-08-19 LAB — GC/CHLAMYDIA PROBE AMP (~~LOC~~) NOT AT ARMC
Chlamydia: NEGATIVE
Comment: NEGATIVE
Comment: NORMAL
Neisseria Gonorrhea: NEGATIVE

## 2023-08-19 MED ORDER — METRONIDAZOLE 0.75 % VA GEL: 1.0000 | Freq: Every day | VAGINAL | 1 refills | Status: DC

## 2023-08-19 MED ORDER — TERCONAZOLE 0.4 % VA CREA: 1.0000 | TOPICAL_CREAM | Freq: Every day | VAGINAL | 0 refills | Status: DC

## 2023-08-19 MED ORDER — SIMETHICONE 80 MG PO TABS: 160.0000 mg | ORAL_TABLET | Freq: Four times a day (QID) | ORAL | 1 refills | Status: DC | PRN

## 2023-08-19 MED ORDER — METOCLOPRAMIDE HCL 10 MG PO TABS: 10.0000 mg | ORAL_TABLET | Freq: Four times a day (QID) | ORAL | 0 refills | Status: DC

## 2023-09-10 ENCOUNTER — Other Ambulatory Visit: Payer: Self-pay | Admitting: Obstetrics & Gynecology

## 2023-09-10 DIAGNOSIS — Z3682 Encounter for antenatal screening for nuchal translucency: Secondary | ICD-10-CM

## 2023-09-11 ENCOUNTER — Other Ambulatory Visit (HOSPITAL_COMMUNITY)
Admission: RE | Admit: 2023-09-11 | Discharge: 2023-09-11 | Disposition: A | Discharge status: Home / Self Care | Source: Ambulatory Visit | Attending: Women's Health | Admitting: Women's Health

## 2023-09-11 ENCOUNTER — Encounter: Payer: Self-pay | Admitting: Women's Health

## 2023-09-11 ENCOUNTER — Encounter: Admitting: *Deleted

## 2023-09-11 ENCOUNTER — Ambulatory Visit (INDEPENDENT_AMBULATORY_CARE_PROVIDER_SITE_OTHER)

## 2023-09-11 ENCOUNTER — Ambulatory Visit (INDEPENDENT_AMBULATORY_CARE_PROVIDER_SITE_OTHER): Admitting: Women's Health

## 2023-09-11 VITALS — BP 102/67 | HR 86 | Wt 96.0 lb

## 2023-09-11 DIAGNOSIS — Z3481 Encounter for supervision of other normal pregnancy, first trimester: Secondary | ICD-10-CM | POA: Diagnosis present

## 2023-09-11 DIAGNOSIS — O0990 Supervision of high risk pregnancy, unspecified, unspecified trimester: Secondary | ICD-10-CM | POA: Insufficient documentation

## 2023-09-11 DIAGNOSIS — Z3480 Encounter for supervision of other normal pregnancy, unspecified trimester: Secondary | ICD-10-CM

## 2023-09-11 DIAGNOSIS — Z131 Encounter for screening for diabetes mellitus: Secondary | ICD-10-CM | POA: Diagnosis not present

## 2023-09-11 DIAGNOSIS — Z113 Encounter for screening for infections with a predominantly sexual mode of transmission: Secondary | ICD-10-CM | POA: Diagnosis not present

## 2023-09-11 DIAGNOSIS — Z3A12 12 weeks gestation of pregnancy: Secondary | ICD-10-CM | POA: Insufficient documentation

## 2023-09-11 DIAGNOSIS — Z1332 Encounter for screening for maternal depression: Secondary | ICD-10-CM

## 2023-09-11 DIAGNOSIS — Z3682 Encounter for antenatal screening for nuchal translucency: Secondary | ICD-10-CM | POA: Diagnosis not present

## 2023-09-11 DIAGNOSIS — Z3490 Encounter for supervision of normal pregnancy, unspecified, unspecified trimester: Secondary | ICD-10-CM | POA: Insufficient documentation

## 2023-09-11 DIAGNOSIS — B009 Herpesviral infection, unspecified: Secondary | ICD-10-CM

## 2023-09-11 MED ORDER — PROMETHAZINE HCL 25 MG PO TABS: 12.5000 mg | ORAL_TABLET | Freq: Four times a day (QID) | ORAL | 6 refills | Status: DC | PRN

## 2023-09-11 MED ORDER — BLOOD PRESSURE MONITOR MISC: 0 refills | Status: AC

## 2023-09-11 MED ORDER — VALACYCLOVIR HCL 500 MG PO TABS: 500.0000 mg | ORAL_TABLET | Freq: Two times a day (BID) | ORAL | 6 refills | Status: DC

## 2023-09-11 NOTE — Patient Instructions (Signed)
Mary Singleton, thank you for choosing our office today! We appreciate the opportunity to meet your healthcare needs. You may receive a short survey by mail, e-mail, or through [employer]. If you are happy with your care we would appreciate if you could take just a few minutes to complete the survey questions. We read all of your comments and take your feedback very seriously. Thank you again for choosing our office.  [facility] at [place] at [facility] ([place], [place] [address]) Entrance C, located off of [address]   Nausea & Vomiting Have saltine crackers or pretzels by your bed and eat a few bites before you raise your head out of bed in the morning Eat small frequent meals throughout the day instead of large meals Drink plenty of fluids throughout the day to stay hydrated, just don't drink a lot of fluids with your meals.  This can make your stomach fill up faster making you feel sick Do not brush your teeth right after you eat Products with real ginger are good for nausea, like ginger ale and ginger hard candy Make sure it says made with real ginger! Sucking on sour candy like lemon heads is also good for nausea If your prenatal vitamins make you nauseated, take them at night so you will sleep through the nausea Sea Bands If you feel like you need medicine for the nausea & vomiting please let us know If you are unable to keep any fluids or food down please let us know   Constipation Drink plenty of fluid, preferably water, throughout the day Eat foods high in fiber such as fruits, vegetables, and grains Exercise, such as walking, is a good way to keep your bowels regular Drink warm fluids, especially warm prune juice, or decaf coffee Eat a 1/2 cup of real oatmeal (not instant), 1/2 cup applesauce, and 1/2-1 cup warm prune juice every day If needed, you may take Colace (docusate sodium) stool  softener once or twice a day to help keep the stool soft.  If you still are having problems with constipation, you may take Miralax once daily as needed to help keep your bowels regular.   Home Blood Pressure Monitoring for Patients   Your provider has recommended that you check your blood pressure (BP) at least once a week at home. If you do not have a blood pressure cuff at home, one will be provided for you. Contact your provider if you have not received your monitor within 1 week.   Helpful Tips for Accurate Home Blood Pressure Checks  Don't smoke, exercise, or drink caffeine 30 minutes before checking your BP Use the restroom before checking your BP (a full bladder can raise your pressure) Relax in a comfortable upright chair Feet on the ground Left arm resting comfortably on a flat surface at the level of your heart Legs uncrossed Back supported Sit quietly and don't talk Place the cuff on your bare arm Adjust snuggly, so that only two fingertips can fit between your skin and the top of the cuff Check 2 readings separated by at least one minute Keep a log of your BP readings For a visual, please reference this diagram: http://ccnc.care/bpdiagram  Provider Name: [facility]     Phone: [phone]  Zone 1: ALL CLEAR  Continue to monitor your symptoms:  BP reading is less than 140 (top number) or less than 90 (bottom  number)  No right upper stomach pain No headaches or seeing spots No feeling nauseated or throwing up No swelling in face and hands  Zone 2: CAUTION Call your doctor's office for any of the following:  BP reading is greater than 140 (top number) or greater than 90 (bottom number)  Stomach pain under your ribs in the middle or right side Headaches or seeing spots Feeling nauseated or throwing up Swelling in face and hands  Zone 3: EMERGENCY  Seek immediate medical care if you have any of the following:  BP reading is greater than160 (top number) or  greater than 110 (bottom number) Severe headaches not improving with Tylenol Serious difficulty catching your breath Any worsening symptoms from Zone 2    First Trimester of Pregnancy The first trimester of pregnancy is from week 1 until the end of week 12 (months 1 through 3). A week after a sperm fertilizes an egg, the egg will implant on the wall of the uterus. This embryo will begin to develop into a baby. Genes from you and your partner are forming the baby. The female genes determine whether the baby is a boy or a girl. At 6-8 weeks, the eyes and face are formed, and the heartbeat can be seen on ultrasound. At the end of 12 weeks, all the baby's organs are formed.  Now that you are pregnant, you will want to do everything you can to have a healthy baby. Two of the most important things are to get good prenatal care and to follow your health care provider's instructions. Prenatal care is all the medical care you receive before the baby's birth. This care will help prevent, find, and treat any problems during the pregnancy and childbirth. BODY CHANGES Your body goes through many changes during pregnancy. The changes vary from woman to woman.  You may gain or lose a couple of pounds at first. You may feel sick to your stomach (nauseous) and throw up (vomit). If the vomiting is uncontrollable, call your health care provider. You may tire easily. You may develop headaches that can be relieved by medicines approved by your health care provider. You may urinate more often. Painful urination may mean you have a bladder infection. You may develop heartburn as a result of your pregnancy. You may develop constipation because certain hormones are causing the muscles that push waste through your intestines to slow down. You may develop hemorrhoids or swollen, bulging veins (varicose veins). Your breasts may begin to grow larger and become tender. Your nipples may stick out more, and the tissue that  surrounds them (areola) may become darker. Your gums may bleed and may be sensitive to brushing and flossing. Dark spots or blotches (chloasma, mask of pregnancy) may develop on your face. This will likely fade after the baby is born. Your menstrual periods will stop. You may have a loss of appetite. You may develop cravings for certain kinds of food. You may have changes in your emotions from day to day, such as being excited to be pregnant or being concerned that something may go wrong with the pregnancy and baby. You may have more vivid and strange dreams. You may have changes in your hair. These can include thickening of your hair, rapid growth, and changes in texture. Some women also have hair loss during or after pregnancy, or hair that feels dry or thin. Your hair will most likely return to normal after your baby is born. WHAT TO EXPECT AT YOUR PRENATAL  VISITS During a routine prenatal visit: You will be weighed to make sure you and the baby are growing normally. Your blood pressure will be taken. Your abdomen will be measured to track your baby's growth. The fetal heartbeat will be listened to starting around week 10 or 12 of your pregnancy. Test results from any previous visits will be discussed. Your health care provider may ask you: How you are feeling. If you are feeling the baby move. If you have had any abnormal symptoms, such as leaking fluid, bleeding, severe headaches, or abdominal cramping. If you have any questions. Other tests that may be performed during your first trimester include: Blood tests to find your blood type and to check for the presence of any previous infections. They will also be used to check for low iron levels (anemia) and Rh antibodies. Later in the pregnancy, blood tests for diabetes will be done along with other tests if problems develop. Urine tests to check for infections, diabetes, or protein in the urine. An ultrasound to confirm the proper growth  and development of the baby. An amniocentesis to check for possible genetic problems. Fetal screens for spina bifida and Down syndrome. You may need other tests to make sure you and the baby are doing well. HOME CARE INSTRUCTIONS  Medicines Follow your health care provider's instructions regarding medicine use. Specific medicines may be either safe or unsafe to take during pregnancy. Take your prenatal vitamins as directed. If you develop constipation, try taking a stool softener if your health care provider approves. Diet Eat regular, well-balanced meals. Choose a variety of foods, such as meat or vegetable-based protein, fish, milk and low-fat dairy products, vegetables, fruits, and whole grain breads and cereals. Your health care provider will help you determine the amount of weight gain that is right for you. Avoid raw meat and uncooked cheese. These carry germs that can cause birth defects in the baby. Eating four or five small meals rather than three large meals a day may help relieve nausea and vomiting. If you start to feel nauseous, eating a few soda crackers can be helpful. Drinking liquids between meals instead of during meals also seems to help nausea and vomiting. If you develop constipation, eat more high-fiber foods, such as fresh vegetables or fruit and whole grains. Drink enough fluids to keep your urine clear or pale yellow. Activity and Exercise Exercise only as directed by your health care provider. Exercising will help you: Control your weight. Stay in shape. Be prepared for labor and delivery. Experiencing pain or cramping in the lower abdomen or low back is a good sign that you should stop exercising. Check with your health care provider before continuing normal exercises. Try to avoid standing for long periods of time. Move your legs often if you must stand in one place for a long time. Avoid heavy lifting. Wear low-heeled shoes, and practice good posture. You may  continue to have sex unless your health care provider directs you otherwise. Relief of Pain or Discomfort Wear a good support bra for breast tenderness.   Take warm sitz baths to soothe any pain or discomfort caused by hemorrhoids. Use hemorrhoid cream if your health care provider approves.   Rest with your legs elevated if you have leg cramps or low back pain. If you develop varicose veins in your legs, wear support hose. Elevate your feet for 15 minutes, 3-4 times a day. Limit salt in your diet. Prenatal Care Schedule your prenatal visits by the  twelfth week of pregnancy. They are usually scheduled monthly at first, then more often in the last 2 months before delivery. Write down your questions. Take them to your prenatal visits. Keep all your prenatal visits as directed by your health care provider. Safety Wear your seat belt at all times when driving. Make a list of emergency phone numbers, including numbers for family, friends, the hospital, and police and fire departments. General Tips Ask your health care provider for a referral to a local prenatal education class. Begin classes no later than at the beginning of month 6 of your pregnancy. Ask for help if you have counseling or nutritional needs during pregnancy. Your health care provider can offer advice or refer you to specialists for help with various needs. Do not use hot tubs, steam rooms, or saunas. Do not douche or use tampons or scented sanitary pads. Do not cross your legs for long periods of time. Avoid cat litter boxes and soil used by cats. These carry germs that can cause birth defects in the baby and possibly loss of the fetus by miscarriage or stillbirth. Avoid all smoking, herbs, alcohol, and medicines not prescribed by your health care provider. Chemicals in these affect the formation and growth of the baby. Schedule a dentist appointment. At home, brush your teeth with a soft toothbrush and be gentle when you floss. SEEK  MEDICAL CARE IF:  You have dizziness. You have mild pelvic cramps, pelvic pressure, or nagging pain in the abdominal area. You have persistent nausea, vomiting, or diarrhea. You have a bad smelling vaginal discharge. You have pain with urination. You notice increased swelling in your face, hands, legs, or ankles. SEEK IMMEDIATE MEDICAL CARE IF:  You have a fever. You are leaking fluid from your vagina. You have spotting or bleeding from your vagina. You have severe abdominal cramping or pain. You have rapid weight gain or loss. You vomit blood or material that looks like coffee grounds. You are exposed to [place] measles and have never had them. You are exposed to fifth disease or chickenpox. You develop a severe headache. You have shortness of breath. You have any kind of trauma, such as from a fall or a car accident. Document Released: 02/13/2001 Document Revised: 07/06/2013 Document Reviewed: 12/30/2012 [facility] Patient Information 2015 [place], [place]. This information is not intended to replace advice given to you by your health care provider. Make sure you discuss any questions you have with your health care provider.

## 2023-09-11 NOTE — Progress Notes (Signed)
 US  12+4 wks,measurements c/w dates,FHR 167 bpm,posterior placenta,normal ovaries,NB present,NT 2 mm,CRL 62.39 mm

## 2023-09-11 NOTE — Progress Notes (Signed)
 INITIAL OBSTETRICAL VISIT Patient name: Mary Singleton MRN [id number]  Date of birth: 1995-11-01 Chief Complaint:   Initial Prenatal Visit  History of Present Illness:   Mary Singleton is a 28 y.o. G67P0102 Caucasian female at [redacted]w[redacted]d by LMP c/w u/s at 7 weeks with an Estimated Date of Delivery: 03/21/24 being seen today for her initial obstetrical visit.   Patient's last menstrual period was 06/15/2023 (approximate). Her obstetrical history is significant for 34wk SVB of twins d/t PTL/PPROM.   Today she reports nausea-requests prn meds, herpes outbreak-requests meds.  Last pap 10/11/20. Results were: NILM w/ HRHPV not done     09/11/2023   11:13 AM 12/28/2022    9:24 AM 10/11/2020    2:54 PM 01/09/2017    2:56 PM  Depression screen PHQ 2/9  Decreased Interest 2 0 0 0  Down, Depressed, Hopeless 0 0 0 0  PHQ - 2 Score 2 0 0 0  Altered sleeping 2  0   Tired, decreased energy 2  0   Change in appetite 0  0   Feeling bad or failure about yourself  0  0   Trouble concentrating 0  1   Moving slowly or fidgety/restless 0  0   Suicidal thoughts 0  0   PHQ-9 Score 6  1         09/11/2023   11:13 AM 10/11/2020    2:54 PM  GAD 7 : Generalized Anxiety Score  Nervous, Anxious, on Edge 2 1  Control/stop worrying 0 0  Worry too much - different things 0 0  Trouble relaxing 0 0  Restless 0 0  Easily annoyed or irritable 0 1  Afraid - awful might happen 0 0  Total GAD 7 Score 2 2     Review of Systems:   Pertinent items are noted in HPI Denies cramping/contractions, leakage of fluid, vaginal bleeding, abnormal vaginal discharge w/ itching/odor/irritation, headaches, visual changes, shortness of breath, chest pain, abdominal pain, severe nausea/vomiting, or problems with urination or bowel movements unless otherwise stated above.  Pertinent History Reviewed:  Reviewed past medical,surgical, social, obstetrical and family history.  Reviewed problem list, medications and allergies. OB  History  Gravida Para Term Preterm AB Living  2 1 0 1 0 2  SAB IAB Ectopic Multiple Live Births  0 0 0 2 2    # Outcome Date GA Lbr Len/2nd Weight Sex Type Anes PTL Lv  2 Current           1A Preterm 03/09/21 [redacted]w[redacted]d / 00:39 4 lb 2.7 oz (1.89 kg) F Vag-Spont EPI  LIV  1B Preterm 03/10/21 [redacted]w[redacted]d / 01:12 4 lb 6.2 oz (1.99 kg) M Vag-Spont EPI  LIV   Physical Assessment:   Vitals:   09/11/23 1134  BP: 102/67  Pulse: 86  Weight: 96 lb (43.5 kg)  Body mass index is 19.39 kg/m.       Physical Examination:  General appearance - well appearing, and in no distress  Mental status - alert, oriented to person, place, and time  Psych:  She has a normal mood and affect  Skin - warm and dry, normal color, no suspicious lesions noted  Chest - effort normal, all lung fields clear to auscultation bilaterally  Heart - normal rate and regular rhythm  Abdomen - soft, nontender  Extremities:  No swelling or varicosities noted  Pelvic - VULVA: normal appearing vulva with no masses, tenderness or lesions  VAGINA: normal appearing  vagina with normal color and discharge, no lesions  CERVIX: normal appearing cervix without discharge or lesions, no CMT  Thin prep pap is not done   Chaperone: N/A  TODAY'S NT US  12+4 wks,measurements c/w dates,FHR 167 bpm,posterior placenta,normal ovaries,NB present,NT 2 mm,CRL 62.39 mm   No results found for this or any previous visit (from the past 24 hours).  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    Dispense:  30 tablet    Refill:  6   valACYclovir  (VALTREX ) 500 MG tablet    Sig: Take 1 tablet (500 mg total) by mouth 2 (two) times daily.    Dispense:  60 tablet    Refill:  6    Initial labs obtained Continue prenatal vitamins Reviewed n/v relief measures and warning s/s to report Reviewed recommended weight gain based on pre-gravid BMI Encouraged well-balanced diet Genetic & carrier screening discussed: requests Panorama and NT/IT, declines Horizon  Ultrasound discussed; fetal survey: requested CCNC completed> form faxed if has or is planning to apply for medicaid The nature of [facility] [employer] with multiple MDs and other Advanced Practice Providers was explained to patient; also emphasized that fellows, residents, and students are part of our team. Does not have home bp cuff. Office bp cuff given: no. Rx sent: yes. Check bp weekly, let us  know if consistently >140/90.   Follow-up: Return in about 4 weeks (around 10/09/2023) for LROB, 2nd IT, CNM, in person; then 7-8wks from now anatomy u/s and LROB w/ CNM.   Orders Placed This Encounter  Procedures   Urine Culture   Integrated 1   PANORAMA PRENATAL TEST   Hemoglobin A1c   CBC/D/Plt+RPR+Rh+ABO+RubIgG...    Suzen JONELLE Fetters CNM, [facility] 09/11/2023 11:57 AM

## 2023-09-12 LAB — CERVICOVAGINAL ANCILLARY ONLY
Chlamydia: NEGATIVE
Comment: NEGATIVE
Comment: NORMAL
Neisseria Gonorrhea: NEGATIVE

## 2023-09-12 LAB — INTEGRATED 1

## 2023-09-13 LAB — INTEGRATED 1
Crown Rump Length: 62.4 mm
Gest. Age on Collection Date: 12.4 wk
Maternal Age at EDD: 26.1 a
Nuchal Translucency (NT): 2 mm
Number of Fetuses: 1
PAPP-A Value: 883.6 ng/mL
Sonographer ID#: 309760
Weight: 96 [lb_av]

## 2023-09-13 LAB — URINE CULTURE

## 2023-09-13 LAB — CBC/D/PLT+RPR+RH+ABO+RUBIGG...
Antibody Screen: NEGATIVE
Basophils Absolute: 0 x10E3/uL (ref 0.0–0.2)
Basos: 0 %
EOS (ABSOLUTE): 0.2 x10E3/uL (ref 0.0–0.4)
Eos: 2 %
HCV Ab: NONREACTIVE
HIV Screen 4th Generation wRfx: NONREACTIVE
Hematocrit: 36.3 % (ref 34.0–46.6)
Hemoglobin: 11.6 g/dL (ref 11.1–15.9)
Hepatitis B Surface Ag: NEGATIVE
Immature Grans (Abs): 0 x10E3/uL (ref 0.0–0.1)
Immature Granulocytes: 0 %
Lymphocytes Absolute: 2.7 x10E3/uL (ref 0.7–3.1)
Lymphs: 27 %
MCH: 28.9 pg (ref 26.6–33.0)
MCHC: 32 g/dL (ref 31.5–35.7)
MCV: 90 fL (ref 79–97)
Monocytes Absolute: 0.6 x10E3/uL (ref 0.1–0.9)
Monocytes: 6 %
Neutrophils Absolute: 6.6 x10E3/uL (ref 1.4–7.0)
Neutrophils: 65 %
Platelets: 224 x10E3/uL (ref 150–450)
RBC: 4.02 x10E6/uL (ref 3.77–5.28)
RDW: 13.2 % (ref 11.7–15.4)
RPR Ser Ql: NONREACTIVE
Rh Factor: POSITIVE
Rubella Antibodies, IGG: 1.03 {index} (ref >0.99)
WBC: 10.2 x10E3/uL (ref 3.4–10.8)

## 2023-09-13 LAB — HEMOGLOBIN A1C
Est. average glucose Bld gHb Est-mCnc: 103 mg/dL
Hgb A1c MFr Bld: 5.2 % (ref 4.8–5.6)

## 2023-09-13 LAB — HCV INTERPRETATION

## 2023-09-18 LAB — PANORAMA PRENATAL TEST FULL PANEL:PANORAMA TEST PLUS 5 ADDITIONAL MICRODELETIONS: FETAL FRACTION: 10.5

## 2023-09-19 ENCOUNTER — Ambulatory Visit: Payer: Self-pay | Admitting: Women's Health

## 2023-09-26 ENCOUNTER — Encounter: Admitting: Advanced Practice Midwife

## 2023-10-01 ENCOUNTER — Ambulatory Visit (INDEPENDENT_AMBULATORY_CARE_PROVIDER_SITE_OTHER): Admitting: Family Medicine

## 2023-10-01 VITALS — BP 83/54 | HR 92 | Temp 98.2°F | Ht 59.0 in | Wt 96.4 lb

## 2023-10-01 DIAGNOSIS — Z7689 Persons encountering health services in other specified circumstances: Secondary | ICD-10-CM | POA: Insufficient documentation

## 2023-10-01 DIAGNOSIS — Z7189 Other specified counseling: Secondary | ICD-10-CM | POA: Diagnosis not present

## 2023-10-01 NOTE — Assessment & Plan Note (Signed)
 Overall well.  Will follow up with OB closely. Follow up with me annually or sooner if needed.

## 2023-10-01 NOTE — Patient Instructions (Signed)
 Follow up annually.  We are happy to see your babies.  Take care  Dr. Bluford

## 2023-10-01 NOTE — Progress Notes (Signed)
 Subjective:  Patient ID: Mary Singleton, female    DOB: 07/02/1995  Age: 28 y.o. MRN: [id number]  CC:  Establish care   HPI:  28 year old female G2P0102 (currently [redacted]w[redacted]d) presents to establish care.  Other than a remote history of HSV, she really has no significant medical issues. She is doing well at this time. Has twins at home and is doing well with current pregnancy. Current taking a prenatal and no other meds. Feeling well. No complaints at this time.  Patient Active Problem List   Diagnosis Date Noted   Encounter to establish care 10/01/2023   Encounter for supervision of normal pregnancy, antepartum 09/11/2023   Herpes infection 11/03/2020    Social Hx   Social History   Socioeconomic History   Marital status: Single    Spouse name: Not on file   Number of children: 0   Years of education: Not on file   Highest education level: 12th grade  Occupational History   Not on file  Tobacco Use   Smoking status: Never   Smokeless tobacco: Never  Vaping Use   Vaping status: Never Used  Substance and Sexual Activity   Alcohol use: No   Drug use: No   Sexual activity: Yes    Birth control/protection: None  Other Topics Concern   Not on file  Social History Narrative   Not on file   Social Drivers of Health   Financial Resource Strain: Low Risk  (10/01/2023)   Overall Financial Resource Strain (CARDIA)    Difficulty of Paying Living Expenses: Not hard at all  Food Insecurity: No Food Insecurity (10/01/2023)   Hunger Vital Sign    Worried About Running Out of Food in the Last Year: Never true    Ran Out of Food in the Last Year: Never true  Transportation Needs: No Transportation Needs (10/01/2023)   PRAPARE - Administrator, Civil Service (Medical): No    Lack of Transportation (Non-Medical): No  Physical Activity: Insufficiently Active (10/01/2023)   Exercise Vital Sign    Days of Exercise per Week: 1 day    Minutes of Exercise per Session: 10 min   Stress: Patient Declined (10/01/2023)   [employer] of Occupational Health - Occupational Stress Questionnaire    Feeling of Stress: Patient declined  Social Connections: Moderately Isolated (10/01/2023)   Social Connection and Isolation Panel    Frequency of Communication with Friends and Family: More than three times a week    Frequency of Social Gatherings with Friends and Family: Once a week    Attends Religious Services: 1 to 4 times per year    Active Member of [employer] or Organizations: No    Attends Engineer, structural: Not on file    Marital Status: Never married    Review of Systems Per HPI  Objective:  BP (!) 83/54   Pulse 92   Temp 98.2 F (36.8 C)   Ht 4' 11 (1.499 m)   Wt 96 lb 6.4 oz (43.7 kg)   LMP 06/15/2023 (Approximate)   SpO2 100%   BMI 19.47 kg/m      10/01/2023    3:33 PM 09/11/2023   11:34 AM 08/19/2023    1:14 AM  BP/Weight  Systolic BP 83 102 98  Diastolic BP 54 67 59  Wt. (Lbs) 96.4 96   BMI 19.47 kg/m2 19.39 kg/m2     Physical Exam Vitals and nursing note reviewed.  Constitutional:  General: She is not in acute distress.    Appearance: Normal appearance.  HENT:     Head: Normocephalic and atraumatic.  Eyes:     General:        Right eye: No discharge.        Left eye: No discharge.     Conjunctiva/sclera: Conjunctivae normal.  Cardiovascular:     Rate and Rhythm: Normal rate and regular rhythm.  Pulmonary:     Effort: Pulmonary effort is normal.     Breath sounds: Normal breath sounds. No wheezing, rhonchi or rales.  Neurological:     Mental Status: She is alert.  Psychiatric:        Mood and Affect: Mood normal.        Behavior: Behavior normal.     Lab Results  Component Value Date   WBC 10.2 09/11/2023   HGB 11.6 09/11/2023   HCT 36.3 09/11/2023   PLT 224 09/11/2023   GLUCOSE 99 01/02/2023   ALT 18 01/02/2023   AST 20 01/02/2023   NA 136 01/02/2023   K 2.7 (LL) 01/02/2023   CL 105 01/02/2023    CREATININE 0.56 01/02/2023   BUN 10 01/02/2023   CO2 22 01/02/2023   TSH 1.87 03/11/2018   HGBA1C 5.2 09/11/2023     Assessment & Plan:  Encounter to establish care Assessment & Plan: Overall well.  Will follow up with OB closely. Follow up with me annually or sooner if needed.     Follow-up:  Annually or sooner if needed  Maddyson Keil DO [facility]

## 2023-10-02 ENCOUNTER — Encounter (HOSPITAL_COMMUNITY): Payer: Self-pay

## 2023-10-02 ENCOUNTER — Emergency Department (HOSPITAL_COMMUNITY)
Admission: EM | Admit: 2023-10-02 | Discharge: 2023-10-03 | Disposition: A | Discharge status: Home / Self Care | Attending: Emergency Medicine | Admitting: Emergency Medicine

## 2023-10-02 ENCOUNTER — Other Ambulatory Visit: Payer: Self-pay

## 2023-10-02 DIAGNOSIS — O4690 Antepartum hemorrhage, unspecified, unspecified trimester: Secondary | ICD-10-CM

## 2023-10-02 DIAGNOSIS — O209 Hemorrhage in early pregnancy, unspecified: Secondary | ICD-10-CM | POA: Insufficient documentation

## 2023-10-02 DIAGNOSIS — R1030 Lower abdominal pain, unspecified: Secondary | ICD-10-CM | POA: Insufficient documentation

## 2023-10-02 DIAGNOSIS — O200 Threatened abortion: Secondary | ICD-10-CM

## 2023-10-02 DIAGNOSIS — Z3A15 15 weeks gestation of pregnancy: Secondary | ICD-10-CM | POA: Diagnosis not present

## 2023-10-02 NOTE — ED Triage Notes (Signed)
 Pov from home. Cc of bleeding after she had a bm. Says that it was vaginal not rectal but not currently bleeding.  C/o light cramping While using the restroom getting a urine sample she said that her water broke, edp notified of this

## 2023-10-03 LAB — CBC
HCT: 38 % (ref 36.0–46.0)
Hemoglobin: 12.6 g/dL (ref 12.0–15.0)
MCH: 29.8 pg (ref 26.0–34.0)
MCHC: 33.2 g/dL (ref 30.0–36.0)
MCV: 89.8 fL (ref 80.0–100.0)
Platelets: 223 K/uL (ref 150–400)
RBC: 4.23 MIL/uL (ref 3.87–5.11)
RDW: 13.6 % (ref 11.5–15.5)
WBC: 10.1 K/uL (ref 4.0–10.5)
nRBC: 0 % (ref 0.0–0.2)

## 2023-10-03 LAB — HCG, QUANTITATIVE, PREGNANCY: hCG, Beta Chain, Quant, S: 11196 m[IU]/mL — ABNORMAL HIGH (ref <5)

## 2023-10-03 NOTE — Discharge Instructions (Signed)
 Return tomorrow at the given time for an ultrasound to further evaluate your pregnancy.  In the meantime, no heavy lifting or strenuous activity.  Vaginal rest meaning no sex or objects in the vagina until cleared by your OB/GYN.  Follow-up with your OB/GYN in the next few days.

## 2023-10-03 NOTE — ED Provider Notes (Signed)
 Mary Singleton EMERGENCY DEPARTMENT AT [facility] Provider Note   CSN: [id number] Arrival date & time: 10/02/23  2312     Patient presents with: Vaginal Bleeding (Rectal?)   Mary Singleton is a 28 y.o. female.   Patient is a 28 year old female G2 P2-0-0-2 presenting at approximately [redacted] weeks gestation for evaluation of vaginal bleeding.  Patient tells me she was having a bowel movement, then noticed a small amount of blood from her vagina.  She describes minimal lower abdominal discomfort.  Bleeding has since resolved.       Prior to Admission medications   Medication Sig Start Date End Date Taking? Authorizing Provider  Blood Pressure Monitor MISC For regular home bp monitoring during pregnancy 09/11/23   Kizzie Suzen SAUNDERS, CNM  promethazine  (PHENERGAN ) 25 MG tablet Take 0.5-1 tablets (12.5-25 mg total) by mouth every 6 (six) hours as needed. 09/11/23   Kizzie Suzen SAUNDERS, CNM  valACYclovir  (VALTREX ) 500 MG tablet Take 1 tablet (500 mg total) by mouth 2 (two) times daily. 09/11/23   Kizzie Suzen SAUNDERS, CNM  clonazePAM (KLONOPIN) 0.5 MG tablet  04/09/18 04/06/19  [provider]    Allergies: Pineapple and Meloxicam     Review of Systems  All other systems reviewed and are negative.   Updated Vital Signs BP 95/60   Pulse 71   Temp 98.4 F (36.9 C) (Oral)   Resp 16   Ht 4' 11 (1.499 m)   Wt 43.7 kg   LMP 06/15/2023 (Approximate)   SpO2 99%   BMI 19.47 kg/m   Physical Exam Vitals and nursing note reviewed.  Constitutional:      General: She is not in acute distress.    Appearance: She is well-developed. She is not diaphoretic.  HENT:     Head: Normocephalic and atraumatic.  Cardiovascular:     Rate and Rhythm: Normal rate and regular rhythm.     Heart sounds: No murmur heard.    No friction rub. No gallop.  Pulmonary:     Effort: Pulmonary effort is normal. No respiratory distress.     Breath sounds: Normal breath sounds. No wheezing.  Abdominal:      General: Bowel sounds are normal. There is no distension.     Palpations: Abdomen is soft.     Tenderness: There is no abdominal tenderness.  Musculoskeletal:        General: Normal range of motion.     Cervical back: Normal range of motion and neck supple.  Skin:    General: Skin is warm and dry.  Neurological:     General: No focal deficit present.     Mental Status: She is alert and oriented to person, place, and time.     (all labs ordered are listed, but only abnormal results are displayed) Labs Reviewed  CBC  HCG, QUANTITATIVE, PREGNANCY    EKG: None  Radiology: No results found.   Procedures   Medications Ordered in the ED - No data to display                                  Medical Decision Making Amount and/or Complexity of Data Reviewed Labs: ordered.   Patient presenting with vaginal bleeding at [redacted] weeks gestation.  Bedside ultrasound reveals an intrauterine pregnancy with good fetal movement and heartbeat.  Patient will be discharged with advice to perform vaginal rest and follow-up with her OB/GYN in  the next few days.  I will arrange a formal ultrasound for the morning.     Final diagnoses:  None    ED Discharge Orders     None          Geroldine Berg, MD 10/03/23 [phone]

## 2023-10-09 ENCOUNTER — Encounter: Payer: Self-pay | Admitting: Advanced Practice Midwife

## 2023-10-09 ENCOUNTER — Ambulatory Visit (INDEPENDENT_AMBULATORY_CARE_PROVIDER_SITE_OTHER): Admitting: Advanced Practice Midwife

## 2023-10-09 ENCOUNTER — Ambulatory Visit (HOSPITAL_COMMUNITY)
Admission: RE | Admit: 2023-10-09 | Discharge: 2023-10-09 | Disposition: A | Discharge status: Home / Self Care | Source: Ambulatory Visit | Attending: Emergency Medicine | Admitting: Emergency Medicine

## 2023-10-09 ENCOUNTER — Telehealth: Payer: Self-pay | Admitting: Advanced Practice Midwife

## 2023-10-09 VITALS — BP 103/70 | HR 90 | Wt 96.0 lb

## 2023-10-09 DIAGNOSIS — Z3480 Encounter for supervision of other normal pregnancy, unspecified trimester: Secondary | ICD-10-CM

## 2023-10-09 DIAGNOSIS — Z3A16 16 weeks gestation of pregnancy: Secondary | ICD-10-CM

## 2023-10-09 DIAGNOSIS — Z3482 Encounter for supervision of other normal pregnancy, second trimester: Secondary | ICD-10-CM | POA: Diagnosis not present

## 2023-10-09 DIAGNOSIS — Z1379 Encounter for other screening for genetic and chromosomal anomalies: Secondary | ICD-10-CM | POA: Diagnosis not present

## 2023-10-09 DIAGNOSIS — O4690 Antepartum hemorrhage, unspecified, unspecified trimester: Secondary | ICD-10-CM | POA: Diagnosis present

## 2023-10-09 NOTE — Telephone Encounter (Signed)
 Patient called asking to speak to someone in regards to her u/s she had done at the hospital today. Asking if someone will call her back at [phone]

## 2023-10-09 NOTE — Patient Instructions (Signed)
Evy, thank you for choosing our office today! We appreciate the opportunity to meet your healthcare needs. You may receive a short survey by mail, e-mail, or through MyChart. If you are happy with your care we would appreciate if you could take just a few minutes to complete the survey questions. We read all of your comments and take your feedback very seriously. Thank you again for choosing our office.  [facility] Team at [facility] [place] ([address] [place], [place] [address]) Entrance C, located off of [address] Free 24/7 valet parking  Go to [email or web address] to register for FREE online childbirth classes  Call the office ([phone]) or go to [facility] if: You begin to severe cramping Your water breaks.  Sometimes it is a big gush of fluid, sometimes it is just a trickle that keeps getting your panties wet or running down your legs You have vaginal bleeding.  It is normal to have a small amount of spotting if your cervix was checked.   [place] Pediatricians/Family Doctors [facility] ([facility]): [address], [phone]           [facility]: [address], [phone]                [facility] ([facility]): [address], [phone] (call to ask if accepting patients) [facility]: [address], [place], [phone]    [place] Pediatricians/Family Doctors [facility] ([facility]): [address], [phone] [facility]: [address], [phone] [facility]: [address], [phone]  [place] [facility]  [facility] ([facility]): [phone] [facility]: [address], [phone]   [place] [facility]: [address], [phone]  [place] [facility]  [facility]: [phone], [phone]  Home Blood Pressure Monitoring for Patients   Your provider has recommended that you check your blood pressure (BP) at least once a week at home. If you do not have a blood pressure cuff at home, one will be provided for you. Contact your provider if you have not received your monitor within 1 week.   Helpful Tips for Accurate Home Blood Pressure Checks  Don't smoke, exercise, or drink caffeine 30 minutes before checking your BP Use the restroom before checking your BP (a full bladder can raise your pressure) Relax in a comfortable upright chair Feet on the ground Left arm resting comfortably on a flat surface at the level of your heart Legs uncrossed Back supported Sit quietly and don't talk Place the cuff on your bare arm Adjust snuggly, so that only two fingertips can fit between your skin and the top of the cuff Check 2 readings separated by at least one minute Keep a log of your BP readings For a visual, please reference this diagram: http://[facility].care/bpdiagram  Provider Name: [facility]     Phone: [phone]  Zone 1: ALL CLEAR  Continue to monitor your symptoms:  BP reading is less than 140 (top number) or less than 90 (bottom number)  No right upper stomach pain No headaches or seeing spots No feeling nauseated or throwing up No swelling in face and hands  Zone 2: CAUTION Call your doctor's office for any of the following:  BP reading is greater than 140 (top number) or greater than   90 (bottom number)  Stomach pain under your ribs in the middle or right side Headaches or seeing spots Feeling nauseated or throwing up Swelling in face and hands  Zone 3: EMERGENCY  Seek immediate medical care if you have any of the following:  BP reading is greater than160 (top number) or greater than 110 (bottom number) Severe headaches not improving with Tylenol Serious difficulty catching your breath Any worsening symptoms from  Zone 2     Second Trimester of Pregnancy The second trimester is from week 14 through week 27 (months 4 through 6). The second trimester is often a time when you feel your best. Your body has adjusted to being pregnant, and you begin to feel better physically. Usually, morning sickness has lessened or quit completely, you may have more energy, and you may have an increase in appetite. The second trimester is also a time when the fetus is growing rapidly. At the end of the sixth month, the fetus is about 9 inches long and weighs about 1 pounds. You will likely begin to feel the baby move (quickening) between 16 and 20 weeks of pregnancy. Body changes during your second trimester Your body continues to go through many changes during your second trimester. The changes vary from woman to woman. Your weight will continue to increase. You will notice your lower abdomen bulging out. You may begin to get stretch marks on your hips, abdomen, and breasts. You may develop headaches that can be relieved by medicines. The medicines should be approved by your health care provider. You may urinate more often because the fetus is pressing on your bladder. You may develop or continue to have heartburn as a result of your pregnancy. You may develop constipation because certain hormones are causing the muscles that push waste through your intestines to slow down. You may develop hemorrhoids or swollen, bulging veins (varicose veins). You may have back pain. This is caused by: Weight gain. Pregnancy hormones that are relaxing the joints in your pelvis. A shift in weight and the muscles that support your balance. Your breasts will continue to grow and they will continue to become tender. Your gums may bleed and may be sensitive to brushing and flossing. Dark spots or blotches (chloasma, mask of pregnancy) may develop on your face. This will likely fade after the baby is born. A dark line from your belly button to  the pubic area (linea nigra) may appear. This will likely fade after the baby is born. You may have changes in your hair. These can include thickening of your hair, rapid growth, and changes in texture. Some women also have hair loss during or after pregnancy, or hair that feels dry or thin. Your hair will most likely return to normal after your baby is born.  What to expect at prenatal visits During a routine prenatal visit: You will be weighed to make sure you and the fetus are growing normally. Your blood pressure will be taken. Your abdomen will be measured to track your baby's growth. The fetal heartbeat will be listened to. Any test results from the previous visit will be discussed.  Your health care provider may ask you: How you are feeling. If you are feeling the baby move. If you have had any abnormal symptoms, such as leaking fluid, bleeding, severe headaches, or abdominal cramping. If you are using any tobacco products, including cigarettes, chewing tobacco, and electronic cigarettes. If you have any questions.  Other tests that may be performed during   your second trimester include: Blood tests that check for: Low iron levels (anemia). High blood sugar that affects pregnant women (gestational diabetes) between 24 and 28 weeks. Rh antibodies. This is to check for a protein on red blood cells (Rh factor). Urine tests to check for infections, diabetes, or protein in the urine. An ultrasound to confirm the proper growth and development of the baby. An amniocentesis to check for possible genetic problems. Fetal screens for spina bifida and Down syndrome. HIV (human immunodeficiency virus) testing. Routine prenatal testing includes screening for HIV, unless you choose not to have this test.  Follow these instructions at home: Medicines Follow your health care provider's instructions regarding medicine use. Specific medicines may be either safe or unsafe to take during  pregnancy. Take a prenatal vitamin that contains at least 600 micrograms (mcg) of folic acid. If you develop constipation, try taking a stool softener if your health care provider approves. Eating and drinking Eat a balanced diet that includes fresh fruits and vegetables, whole grains, good sources of protein such as meat, eggs, or tofu, and low-fat dairy. Your health care provider will help you determine the amount of weight gain that is right for you. Avoid raw meat and uncooked cheese. These carry germs that can cause birth defects in the baby. If you have low calcium intake from food, talk to your health care provider about whether you should take a daily calcium supplement. Limit foods that are high in fat and processed sugars, such as fried and sweet foods. To prevent constipation: Drink enough fluid to keep your urine clear or pale yellow. Eat foods that are high in fiber, such as fresh fruits and vegetables, whole grains, and beans. Activity Exercise only as directed by your health care provider. Most women can continue their usual exercise routine during pregnancy. Try to exercise for 30 minutes at least 5 days a week. Stop exercising if you experience uterine contractions. Avoid heavy lifting, wear low heel shoes, and practice good posture. A sexual relationship may be continued unless your health care provider directs you otherwise. Relieving pain and discomfort Wear a good support bra to prevent discomfort from breast tenderness. Take warm sitz baths to soothe any pain or discomfort caused by hemorrhoids. Use hemorrhoid cream if your health care provider approves. Rest with your legs elevated if you have leg cramps or low back pain. If you develop varicose veins, wear support hose. Elevate your feet for 15 minutes, 3-4 times a day. Limit salt in your diet. Prenatal Care Write down your questions. Take them to your prenatal visits. Keep all your prenatal visits as told by your health  care provider. This is important. Safety Wear your seat belt at all times when driving. Make a list of emergency phone numbers, including numbers for family, friends, the hospital, and police and fire departments. General instructions Ask your health care provider for a referral to a local prenatal education class. Begin classes no later than the beginning of month 6 of your pregnancy. Ask for help if you have counseling or nutritional needs during pregnancy. Your health care provider can offer advice or refer you to specialists for help with various needs. Do not use hot tubs, steam rooms, or saunas. Do not douche or use tampons or scented sanitary pads. Do not cross your legs for long periods of time. Avoid cat litter boxes and soil used by cats. These carry germs that can cause birth defects in the baby and possibly loss of the   fetus by miscarriage or stillbirth. Avoid all smoking, herbs, alcohol, and unprescribed drugs. Chemicals in these products can affect the formation and growth of the baby. Do not use any products that contain nicotine or tobacco, such as cigarettes and e-cigarettes. If you need help quitting, ask your health care provider. Visit your dentist if you have not gone yet during your pregnancy. Use a soft toothbrush to brush your teeth and be gentle when you floss. Contact a health care provider if: You have dizziness. You have mild pelvic cramps, pelvic pressure, or nagging pain in the abdominal area. You have persistent nausea, vomiting, or diarrhea. You have a bad smelling vaginal discharge. You have pain when you urinate. Get help right away if: You have a fever. You are leaking fluid from your vagina. You have spotting or bleeding from your vagina. You have severe abdominal cramping or pain. You have rapid weight gain or weight loss. You have shortness of breath with chest pain. You notice sudden or extreme swelling of your face, hands, ankles, feet, or legs. You  have not felt your baby move in over an hour. You have severe headaches that do not go away when you take medicine. You have vision changes. Summary The second trimester is from week 14 through week 27 (months 4 through 6). It is also a time when the fetus is growing rapidly. Your body goes through many changes during pregnancy. The changes vary from woman to woman. Avoid all smoking, herbs, alcohol, and unprescribed drugs. These chemicals affect the formation and growth your baby. Do not use any tobacco products, such as cigarettes, chewing tobacco, and e-cigarettes. If you need help quitting, ask your health care provider. Contact your health care provider if you have any questions. Keep all prenatal visits as told by your health care provider. This is important. This information is not intended to replace advice given to you by your health care provider. Make sure you discuss any questions you have with your health care provider. Document Released: 02/13/2001 Document Revised: 07/28/2015 Document Reviewed: 04/22/2012 Elsevier Interactive Patient Education  2017 [employer]vier Inc.  

## 2023-10-09 NOTE — Progress Notes (Signed)
   LOW-RISK PREGNANCY VISIT Patient name: Mary Singleton MRN [id number]  Date of birth: 05-Aug-1995 Chief Complaint:   Routine Prenatal Visit (2 ndIT, has place vaginal area want you check)  History of Present Illness:   Mary Singleton is a 28 y.o. [id number] female at [redacted]w[redacted]d with an Estimated Date of Delivery: 03/21/24 being seen today for ongoing management of a low-risk pregnancy.  Today she reports having something bulge; from her vagina during straining w a BM; also was seen at [facility] 7/31 with sm vag bldg- no further bldg . Contractions: Not present.  .  Movement: Absent. denies leaking of fluid. Review of Systems:   Pertinent items are noted in HPI Denies abnormal vaginal discharge w/ itching/odor/irritation, headaches, visual changes, shortness of breath, chest pain, abdominal pain, severe nausea/vomiting, or problems with urination or bowel movements unless otherwise stated above. Pertinent History Reviewed:  Reviewed past medical,surgical, social, obstetrical and family history.  Reviewed problem list, medications and allergies. Physical Assessment:   Vitals:   10/09/23 1129  BP: 103/70  Pulse: 90  Weight: 96 lb (43.5 kg)  Body mass index is 19.39 kg/m.        Physical Examination:   General appearance: Well appearing, and in no distress  Mental status: Alert, oriented to person, place, and time  Skin: Warm & dry  Cardiovascular: Normal heart rate noted  Respiratory: Normal respiratory effort, no distress  Abdomen: Soft, gravid, nontender; FHTs 159bpm  Pelvic: Cervical exam deferred ; visualized introitus and palpated with pt bearing down- not bulging noted        Extremities: Edema: None  Fetal Status: Fetal Heart Rate (bpm): 159   Movement: Absent    No results found for this or any previous visit (from the past 24 hours).  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  Labs/procedures today: 2nd IT  Plan:  Continue routine obstetrical care   Reviewed: Preterm labor symptoms and general obstetric precautions including but not limited to vaginal bleeding, contractions, leaking of fluid and fetal movement were reviewed in detail with the patient.  All questions were answered. Didn't ask about home bp cuff. Check bp weekly, let us  know if >140/90.   Follow-up: Return for (add Pap to next visit notes).  Orders Placed This Encounter  Procedures   INTEGRATED 2   Suzen JONETTA Gentry [facility] 10/09/2023 11:53 AM

## 2023-10-09 NOTE — Telephone Encounter (Signed)
 LMOVM returning patient's call. Informed ultrasound was normal and could call back if she had further questions.

## 2023-10-30 ENCOUNTER — Other Ambulatory Visit: Payer: Self-pay | Admitting: Obstetrics & Gynecology

## 2023-10-30 DIAGNOSIS — Z363 Encounter for antenatal screening for malformations: Secondary | ICD-10-CM

## 2023-11-01 ENCOUNTER — Other Ambulatory Visit

## 2023-11-01 ENCOUNTER — Encounter: Admitting: Advanced Practice Midwife

## 2023-11-01 DIAGNOSIS — Z124 Encounter for screening for malignant neoplasm of cervix: Secondary | ICD-10-CM

## 2023-11-14 ENCOUNTER — Encounter: Payer: Self-pay | Admitting: Advanced Practice Midwife

## 2023-11-21 ENCOUNTER — Ambulatory Visit (INDEPENDENT_AMBULATORY_CARE_PROVIDER_SITE_OTHER)

## 2023-11-21 ENCOUNTER — Ambulatory Visit (INDEPENDENT_AMBULATORY_CARE_PROVIDER_SITE_OTHER): Admitting: Advanced Practice Midwife

## 2023-11-21 VITALS — BP 104/67 | HR 86 | Wt 99.0 lb

## 2023-11-21 DIAGNOSIS — O365920 Maternal care for other known or suspected poor fetal growth, second trimester, not applicable or unspecified: Secondary | ICD-10-CM

## 2023-11-21 DIAGNOSIS — Z3A22 22 weeks gestation of pregnancy: Secondary | ICD-10-CM

## 2023-11-21 DIAGNOSIS — Z363 Encounter for antenatal screening for malformations: Secondary | ICD-10-CM | POA: Diagnosis not present

## 2023-11-21 DIAGNOSIS — O365990 Maternal care for other known or suspected poor fetal growth, unspecified trimester, not applicable or unspecified: Secondary | ICD-10-CM

## 2023-11-21 DIAGNOSIS — Z3480 Encounter for supervision of other normal pregnancy, unspecified trimester: Secondary | ICD-10-CM

## 2023-11-21 MED ORDER — OB COMPLETE PETITE 35-5-1-200 MG PO CAPS: 1.0000 | ORAL_CAPSULE | Freq: Every day | ORAL | 11 refills | Status: AC

## 2023-11-21 NOTE — Patient Instructions (Addendum)
 Echogenic Intracardiac Focus  During the second trimester, your baby's heart  will be looked at using an ultrasound. Occasionally, one or more bright spots are  seen in the heart. These spots are called an  echogenic intracardiac focus. They are usually  seen in the muscle wall of the ventricles. An echogenic intracardiac focus is found in  1 out of every 20 to 30 pregnancies. It does  not affect the health of your baby or how their  heart develops.  The spots usually do not go away before  your baby is born. If they are still found on  an ultrasound after your baby is born, this is  considered normal and will not cause problems  for your baby. Causes The cause of an echogenic intracardiac focus  is unknown.  It is possible that calcium  deposits in the  muscle wall of the ventricles may cause these  spots. Calcium  is a natural mineral found in  the body. Areas of the body that have more  calcium , such as bones, show up brighter on  an ultrasound. An echogenic intracardiac focus can be seen in  any pregnancy, but is more common in females  with Asian ancestry. Treatment There is no treatment for an echogenic  intracardiac focus. In most cases, additional  tests are not needed.

## 2023-11-21 NOTE — Progress Notes (Addendum)
   LOW-RISK PREGNANCY VISIT Patient name: Mary Singleton MRN [id number]  Date of birth: 1995-09-12 Chief Complaint:   Routine Prenatal Visit  History of Present Illness:   Mary Singleton is a 28 y.o. G25P0102 female at [redacted]w[redacted]d with an Estimated Date of Delivery: 03/21/24 being seen today for ongoing management of a low-risk pregnancy.  Today she reports no complaints. Contractions: Not present. Vag. Bleeding: None.  Movement: Present. Denies leaking of fluid. Review of Systems:   Pertinent items are noted in HPI Denies abnormal vaginal discharge w/ itching/odor/irritation, headaches, visual changes, shortness of breath, chest pain, abdominal pain, severe nausea/vomiting, or problems with urination or bowel movements unless otherwise stated above. Pertinent History Reviewed:  Reviewed past medical,surgical, social, obstetrical and family history.  Reviewed problem list, medications and allergies.  Physical Assessment:     Vitals:   11/21/23 1600  BP: 104/67  Pulse: 86  Weight: 99 lb (44.9 kg)  Body mass index is 20 kg/m.        Physical Examination:   General appearance: Well appearing, and in no distress  Mental status: Alert, oriented to person, place, and time  Skin: Warm & dry  Cardiovascular: Normal heart rate noted  Respiratory: Normal respiratory effort, no distress  Abdomen: Soft, gravid, nontender  Pelvic: deferred         Extremities:  No edema Chaperone: NA   Fetal Status: FHR 160 on US     Movement: Present    US  22+5 wks,breech,posterior placenta gr 0,SVP of fluid 3.1 cm,normal right ovary,left ovary not visualized,LVEICF,FHR 160 bpm,EFW 439 g 8% AC 11%,anatomy complete    No results found for this or any previous visit (from the past 24 hours).  Assessment & Plan:   Pregnancy: G2P0102 at [redacted]w[redacted]d  1. Supervision of other normal pregnancy, antepartum - Feeling well, has started to notice fetal movement  2. [redacted] weeks gestation of pregnancy - Reviewed anatomy  results including LVEICF, NIPS LR, information provided in AVS  3. Fetal growth restriction antepartum (Primary) - Anatomy US  shows EFW 8%, AC 11%, reviewed with Dr. Ozan who recommends repeat US  in 3 weeks to confirm FGR - Patient does not smoke and has had normal weight gain so far in pregnancy.  Current PNV and gummy PNV have caused nausea  - US  OB Follow Up; Future  Meds:  Meds ordered this encounter  Medications   Prenat-FeCbn-FeAspGl-FA-Omega (OB COMPLETE PETITE) 35-5-1-200 MG CAPS    Sig: Take 1 tablet by mouth daily. BIN:  [id number]   PCN:  [id number]     GRP:  [id number]    ID:  [id number]    Dispense:  30 capsule    Refill:  11    Supervising Provider:   JAYNE VONN DEL [[id number]]   Labs/procedures today: anatomy  Plan:   Next visit: in-person for follow-up US     Reviewed: Preterm labor symptoms and general obstetric precautions including but not limited to vaginal bleeding, contractions, leaking of fluid and fetal movement were reviewed in detail with the patient.  All questions were answered.  Follow-up: Return in about 3 weeks (around 12/12/2023) for HROB w/CNM or MD and EFW.  Future Appointments  Date Time Provider Department Center  12/12/2023  1:30 PM [facility] US  [facility] None  12/12/2023  2:30 PM Eure, VONN DEL, MD [facility]   Orders Placed This Encounter  Procedures   US  OB Follow Up   Cathlean Ely, [facility] 11/21/2023 6:44 PM

## 2023-11-21 NOTE — Progress Notes (Addendum)
 US  22+5 wks,breech,posterior placenta gr 0,SVP of fluid 3.1 cm,normal right ovary,left ovary not visualized,LVEICF,FHR 160 bpm,EFW 439 g 8% AC 11%,anatomy complete

## 2023-12-04 ENCOUNTER — Encounter: Payer: Self-pay | Admitting: Family Medicine

## 2023-12-04 ENCOUNTER — Ambulatory Visit: Admitting: Family Medicine

## 2023-12-04 VITALS — BP 95/68 | HR 105 | Temp 98.8°F | Ht 59.0 in | Wt 99.8 lb

## 2023-12-04 DIAGNOSIS — R509 Fever, unspecified: Secondary | ICD-10-CM | POA: Diagnosis not present

## 2023-12-04 DIAGNOSIS — J029 Acute pharyngitis, unspecified: Secondary | ICD-10-CM | POA: Diagnosis not present

## 2023-12-04 MED ORDER — ONDANSETRON HCL 4 MG PO TABS: 4.0000 mg | ORAL_TABLET | Freq: Three times a day (TID) | ORAL | 1 refills | Status: DC | PRN

## 2023-12-04 NOTE — Progress Notes (Unsigned)
   Subjective:    Patient ID: Mary Singleton, female    DOB: [birth year], 28 y.o.   MRN: [id number]  HPI Pregnancy- sore throat, patient with nausea, headache, dizziness, diarrhea for 2 days No other symptoms reported  Started off of sore throat and some diarrhea  She states she was feeling good up till couple days ago then she started having sore throat body aches headache fever no cough no runny nose no wheezing some intermittent abdominal discomfort with diarrhea and also with nausea she is [redacted] weeks pregnant Review of Systems     Objective:   Physical Exam  Gen-NAD not toxic TMS-normal bilateral T- normal no redness Chest-CTA respiratory rate normal no crackles CV RRR no murmur Skin-warm dry Neuro-grossly normal Patient does not appear toxic      Assessment & Plan:  [redacted] weeks pregnant If not improving over the next 48 hours recommend recheck COVID flu swab sent Rapid strep negative Supportive measures discussed Clear liquids bland foods Zofran  Tylenol  if needed for fever (I did communicate with her gynecology group who stated it would be fine to use Tylenol  with fevers  Once again if not seeing significant improvement in 48 hours recheck

## 2023-12-06 ENCOUNTER — Ambulatory Visit: Payer: Self-pay | Admitting: Family Medicine

## 2023-12-06 LAB — COVID-19, FLU A+B AND RSV
Influenza A, NAA: NOT DETECTED
Influenza B, NAA: NOT DETECTED
RSV, NAA: NOT DETECTED
SARS-CoV-2, NAA: NOT DETECTED

## 2023-12-12 ENCOUNTER — Ambulatory Visit (INDEPENDENT_AMBULATORY_CARE_PROVIDER_SITE_OTHER): Admitting: Advanced Practice Midwife

## 2023-12-12 ENCOUNTER — Ambulatory Visit (INDEPENDENT_AMBULATORY_CARE_PROVIDER_SITE_OTHER)

## 2023-12-12 ENCOUNTER — Other Ambulatory Visit: Payer: Self-pay | Admitting: Advanced Practice Midwife

## 2023-12-12 ENCOUNTER — Encounter: Payer: Self-pay | Admitting: Advanced Practice Midwife

## 2023-12-12 VITALS — BP 96/64 | HR 92 | Wt 101.0 lb

## 2023-12-12 DIAGNOSIS — O365920 Maternal care for other known or suspected poor fetal growth, second trimester, not applicable or unspecified: Secondary | ICD-10-CM | POA: Diagnosis not present

## 2023-12-12 DIAGNOSIS — O365990 Maternal care for other known or suspected poor fetal growth, unspecified trimester, not applicable or unspecified: Secondary | ICD-10-CM

## 2023-12-12 DIAGNOSIS — O0992 Supervision of high risk pregnancy, unspecified, second trimester: Secondary | ICD-10-CM | POA: Diagnosis not present

## 2023-12-12 DIAGNOSIS — Z3A25 25 weeks gestation of pregnancy: Secondary | ICD-10-CM

## 2023-12-12 DIAGNOSIS — B9689 Other specified bacterial agents as the cause of diseases classified elsewhere: Secondary | ICD-10-CM

## 2023-12-12 DIAGNOSIS — J069 Acute upper respiratory infection, unspecified: Secondary | ICD-10-CM

## 2023-12-12 MED ORDER — LIDOCAINE VISCOUS HCL 2 % MT SOLN
5.0000 mL | OROMUCOSAL | 0 refills | Status: DC | PRN
Start: 2023-12-12 — End: 2023-12-26

## 2023-12-12 MED ORDER — AMOXICILLIN-POT CLAVULANATE 500-125 MG PO TABS: 1.0000 | ORAL_TABLET | Freq: Three times a day (TID) | ORAL | 0 refills | Status: DC

## 2023-12-12 NOTE — Patient Instructions (Addendum)
 [facility]  [phone] ext [phone]  Call at least 3 days in advance to set up transportation      1. Before your test, do not eat or drink anything for 8-10 hours prior to your  appointment (a small amount of water is allowed and you may take any medicines you normally take). Be sure to drink lots of water the day before. 2. When you arrive, your blood will be drawn for a 'fasting' blood sugar level.  Then you will be given a sweetened carbonated beverage to drink. You should  complete drinking this beverage within five minutes. After finishing the  beverage, you will have your blood drawn exactly 1 and 2 hours later. Having  your blood drawn on time is an important part of this test. A total of three blood  samples will be done. 3. The test takes approximately 2  hours. During the test, do not have anything to  eat or drink. Do not smoke, chew gum (not even sugarless gum) or use breath mints.  4. During the test you should remain close by and seated as much as possible and  avoid walking around. You may want to bring a book or something else to  occupy your time.  5. After your test, you may eat and drink as normal. You may want to bring a snack  to eat after the test is finished. Your provider will advise you as to the results of  this test and any follow-up if necessary  If your sugar test is positive for gestational diabetes, you will be given an phone call and further instructions discussed. If you wish to know all of your test results before your next appointment, feel free to call the office, or look up your test results on Mychart.  (The range that the lab uses for normal values of the sugar test are not necessarily the range that is used for pregnant women; if your results are within the normal range, they are definitely normal.  However, if a value is deemed high by the lab, it may not be too high for a pregnant woman.  We will need to discuss the results if your value(s) fall in the  high category).     Tdap Vaccine It is recommended that you get the Tdap vaccine during the third trimester of EACH pregnancy to help protect your baby from getting pertussis (whooping cough) 27-36 weeks is the BEST time to do this so that you can pass the protection on to your baby. During pregnancy is better than after pregnancy, but if you are unable to get it during pregnancy it will be offered at the hospital. You will be offered this vaccine in the office after 27 weeks.  If you do not have health insurance, you can get the vaccine from the [facility] (no appointment needed).  Everyone who will be around your baby should also be up-to-date on their vaccines. Adults (who are not pregnant) only need 1 dose of Tdap during adulthood.

## 2023-12-12 NOTE — Progress Notes (Signed)
 HIGH-RISK PREGNANCY VISIT Patient name: Mary Singleton MRN [id number]  Date of birth: 1995/09/02 Chief Complaint:   Routine Prenatal Visit  History of Present Illness:   Mary Singleton is a 28 y.o. G95P0102 female at [redacted]w[redacted]d with an Estimated Date of Delivery: 03/21/24 being seen today for ongoing management of a high-risk pregnancy complicated by fetal growth restriction 3%. W/elevated dopplers today    Today she reports URI sx still persistent, present for 10 days now. Throat hurts and has chest congestion, productive cough. Contractions: Not present. Vag. Bleeding: None.  Movement: Present. denies leaking of fluid.      12/04/2023    3:14 PM 09/11/2023   11:13 AM 12/28/2022    9:24 AM 10/11/2020    2:54 PM 01/09/2017    2:56 PM  Depression screen PHQ 2/9  Decreased Interest 1 2 0 0 0  Down, Depressed, Hopeless 1 0 0 0 0  PHQ - 2 Score 2 2 0 0 0  Altered sleeping 0 2  0   Tired, decreased energy 2 2  0   Change in appetite 0 0  0   Feeling bad or failure about yourself  0 0  0   Trouble concentrating 0 0  1   Moving slowly or fidgety/restless 0 0  0   Suicidal thoughts 0 0  0   PHQ-9 Score 4 6  1    Difficult doing work/chores Not difficult at all            12/04/2023    3:15 PM 09/11/2023   11:13 AM 10/11/2020    2:54 PM  GAD 7 : Generalized Anxiety Score  Nervous, Anxious, on Edge 1 2 1   Control/stop worrying 1 0 0  Worry too much - different things 1 0 0  Trouble relaxing 0 0 0  Restless 0 0 0  Easily annoyed or irritable 1 0 1  Afraid - awful might happen 1 0 0  Total GAD 7 Score 5 2 2   Anxiety Difficulty Not difficult at all       Review of Systems:   Pertinent items are noted in HPI Denies abnormal vaginal discharge w/ itching/odor/irritation, headaches, visual changes, shortness of breath, chest pain, abdominal pain, severe nausea/vomiting, or problems with urination or bowel movements unless otherwise stated above. Pertinent History Reviewed:  Reviewed past  medical,surgical, social, obstetrical and family history.  Reviewed problem list, medications and allergies. Physical Assessment:   Vitals:   12/12/23 1415  BP: 96/64  Pulse: 92  Weight: 101 lb (45.8 kg)  Body mass index is 20.4 kg/m.           Physical Examination:   General appearance: alert, well appearing, and in no distress  Mental status: alert, oriented to person, place, and time  Skin: warm & dry   Extremities: Edema: None    Cardiovascular: normal heart rate noted  Respiratory: normal respiratory effort, no distress  Abdomen: gravid, soft, non-tender  Pelvic: Cervical exam deferred         Chaperone: N/A    Fetal Status: Fetal Heart Rate (bpm): 148 Fundal Height: 17 cm Movement: Present    Fetal Surveillance Testing today: US  25+5 wks,breech,posterior fundal placenta gr 0,CX 3.5 cm,SVP 3.8 cm,FHR 165 bpm,elevated UAD with EDF,RI .77,.81,.87=95%,EFW 652 g 3%,AC 5%      No results found for this or any previous visit (from the past 24 hours).  Assessment & Plan:  High-risk pregnancy: G2P0102 at [redacted]w[redacted]d with an  Estimated Date of Delivery: 03/21/24   1. [redacted] weeks gestation of pregnancy (Primary)   2. High-risk pregnancy in second trimester   3. Maternal care for poor fetal growth in second trimester, single or unspecified fetus Weekly dopplers, add BPP @ 28 weeks, add NST @ 32 weeks, EFW q 3 weeks  - US  OB Follow Up; Standing - US  UA Cord Doppler; Standing - US  FETAL BPP WO NON STRESS; Standing  4. Bacterial URI :  Meds ordered this encounter  Medications   amoxicillin-clavulanate (AUGMENTIN) 500-125 MG tablet    Sig: Take 1 tablet by mouth 3 (three) times daily.    Dispense:  14 tablet    Refill:  0    Supervising Provider:   JAYNE MINDER H [[id number]]   lidocaine  (XYLOCAINE ) 2 % solution    Sig: Use as directed 5 mLs in the mouth or throat as needed for mouth pain.    Dispense:  100 mL    Refill:  0    Supervising Provider:   JAYNE MINDER H [[id number]]     Orders:  Orders Placed This Encounter  Procedures   US  OB Follow Up   US  UA Cord Doppler   US  FETAL BPP WO NON STRESS     Labs/procedures today: U/S   Reviewed: Preterm labor symptoms and general obstetric precautions including but not limited to vaginal bleeding, contractions, leaking of fluid and fetal movement were reviewed in detail with the patient.  All questions were answered.  [facility] info given to help w/transportation  Follow-up: Return in about 2 weeks (around 12/26/2023) for PN2/LROB.   Future Appointments  Date Time Provider Department Center  12/19/2023  9:15 AM [facility] US  [facility] None  12/19/2023 10:10 AM Kizzie Suzen SAUNDERS, CNM [facility]  12/26/2023  8:30 AM [facility]  12/26/2023 10:00 AM [facility] - [facility] US  [facility] None  12/26/2023 10:50 AM Newton Mering, CNM [facility]  01/02/2024 10:00 AM [facility] - [facility] US  [facility] None  01/02/2024 10:50 AM Kizzie Suzen SAUNDERS, CNM [facility]    Orders Placed This Encounter  Procedures   US  OB Follow Up   US  UA Cord Doppler   US  FETAL BPP WO NON STRESS   Mering Newton , DNP, CNM [facility] 12/12/2023 2:52 PM

## 2023-12-12 NOTE — Progress Notes (Signed)
 US  25+5 wks,breech,posterior fundal placenta gr 0,CX 3.5 cm,SVP 3.8 cm,FHR 165 bpm,elevated UAD with EDF,RI .77,.81,.87=95%,EFW 652 g 3%,AC 5%

## 2023-12-13 DIAGNOSIS — O34522 Maternal care for prolapse of gravid uterus, second trimester: Secondary | ICD-10-CM | POA: Insufficient documentation

## 2023-12-14 ENCOUNTER — Other Ambulatory Visit (HOSPITAL_COMMUNITY): Payer: Self-pay | Admitting: Obstetrics & Gynecology

## 2023-12-14 ENCOUNTER — Encounter (HOSPITAL_COMMUNITY): Payer: Self-pay | Admitting: Emergency Medicine

## 2023-12-14 ENCOUNTER — Emergency Department (HOSPITAL_COMMUNITY)
Admission: EM | Admit: 2023-12-14 | Discharge: 2023-12-14 | Disposition: A | Discharge status: Home / Self Care | Attending: Emergency Medicine | Admitting: Emergency Medicine

## 2023-12-14 ENCOUNTER — Other Ambulatory Visit: Payer: Self-pay

## 2023-12-14 DIAGNOSIS — N812 Incomplete uterovaginal prolapse: Secondary | ICD-10-CM

## 2023-12-14 MED ORDER — SODIUM CHLORIDE 0.9 % IV BOLUS
1000.0000 mL | Freq: Once | INTRAVENOUS | Status: AC
  Administered 2023-12-14: 1000 mL via INTRAVENOUS

## 2023-12-14 NOTE — Progress Notes (Signed)
 Called regarding pt being in [facility] ED. Dr Jayne had been previously called about this patient, so we were already aware of her complaint. Pt is presenting with a prolapsed cervix. [facility] ED MD would like pt monitored for 1 hour. Adding pt to EFM/Obix for remote monitoring.

## 2023-12-14 NOTE — ED Triage Notes (Addendum)
 Pt states she went to bathroom 30 mins ago and states something is hanging out vagina. Pt states that she was told at her last OBGYN visit that there was something wrong with her placenta that would require weekly check-ups on out. Pt states she is having abdominal cramping and dizziness. EDP at bedside.

## 2023-12-14 NOTE — ED Provider Notes (Signed)
 [place] EMERGENCY DEPARTMENT AT [facility] Provider Note   CSN: [id number] Arrival date & time: 12/13/23  2338     Patient presents with: Pregnancy Complication   Mary Singleton is a 28 y.o. female.   The history is provided by the patient.  Patient is a G2P0102 at approximately 25 weeks presents for concerns for pregnancy complication.  She reports about 1/2-hour ago she noticed that something was hanging from my vagina She denies any vaginal bleeding.  She may have some fluid loss.  She reports mild abdominal cramping.  No fevers or vomiting.  No trauma.  No headaches.  She reports good fetal movement.    Past Medical History:  Diagnosis Date   Medical history non-contributory     Prior to Admission medications   Medication Sig Start Date End Date Taking? Authorizing Provider  amoxicillin-clavulanate (AUGMENTIN) 500-125 MG tablet Take 1 tablet by mouth 3 (three) times daily. 12/12/23   Cresenzo-Dishmon, Cathlean, CNM  Blood Pressure Monitor MISC For regular home bp monitoring during pregnancy Patient not taking: Reported on 12/12/2023 09/11/23   Kizzie Suzen SAUNDERS, CNM  lidocaine  (XYLOCAINE ) 2 % solution Use as directed 5 mLs in the mouth or throat as needed for mouth pain. 12/12/23   Cresenzo-Dishmon, Cathlean, CNM  ondansetron  (ZOFRAN ) 4 MG tablet Take 1 tablet (4 mg total) by mouth every 8 (eight) hours as needed for nausea or vomiting. 12/04/23   Alphonsa Glendia LABOR, MD  Prenat-FeCbn-FeAspGl-FA-Omega (OB COMPLETE PETITE) 35-5-1-200 MG CAPS Take 1 tablet by mouth daily. BIN:  [id number]   PCN:  [id number]     GRP:  [id number]    ID:  [id number] 11/21/23   Cresenzo-Dishmon, Cathlean, CNM  valACYclovir  (VALTREX ) 500 MG tablet Take 1 tablet (500 mg total) by mouth 2 (two) times daily. Patient not taking: Reported on 12/12/2023 09/11/23   Kizzie Suzen SAUNDERS, CNM  clonazePAM (KLONOPIN) 0.5 MG tablet  04/09/18 04/06/19  [provider]    Allergies: Pineapple and Meloxicam     Review  of Systems  Updated Vital Signs BP 93/60   Pulse 88   Temp 98.7 F (37.1 C) (Oral)   Resp 16   Ht 1.499 m (4' 11)   Wt 46 kg   LMP 06/15/2023 (Approximate)   SpO2 100%   BMI 20.48 kg/m   Physical Exam CONSTITUTIONAL: Well developed/well nourished, anxious HEAD: Normocephalic/atraumatic ABDOMEN: soft, nontender, no rebound or guarding, bowel sounds noted throughout abdomen GU: Exam performed with nurse present.  Cervical prolapse is noted.  No bleeding.  No fetal products are noted NEURO: Pt is awake/alert/appropriate, moves all extremitiesx4.  No facial droop.    (all labs ordered are listed, but only abnormal results are displayed) Labs Reviewed - No data to display  EKG: None  Radiology: No results found.   Procedures   Medications Ordered in the ED  sodium chloride  0.9 % bolus 1,000 mL (0 mLs Intravenous Stopped 12/14/23 0135)    Clinical Course as of 12/14/23 0138  Sat Dec 14, 2023  0035 Patient seen on arrival concerning for preterm labor.  Exam revealed what appeared to be cervical prolapse.  I discussed the case with Dr. Jayne OB/GYN who was reviewed her notes as well as the images, and confirmed this is likely cervical prolapse.  No acute intervention is required.  She does not require ongoing monitoring.  He has arranged follow-up in his office tomorrow morning to have a pessary placed patient agrees with plan [DW]  0138 Patient  has done well.  No complications noted on fetal monitoring, this is been reviewed by the OB rapid response nurse. Patient is improved, will be discharged home to follow-up with OB/GYN at 10 AM [DW]    Clinical Course User Index [DW] Midge Golas, MD                                 Medical Decision Making       Final diagnoses:  Cervical prolapse    ED Discharge Orders     None          Midge Golas, MD 12/14/23 0139

## 2023-12-14 NOTE — ED Notes (Signed)
 OB Rapid Response Nurse Paged at this time.

## 2023-12-14 NOTE — ED Notes (Signed)
 ED Provider at bedside.

## 2023-12-14 NOTE — Progress Notes (Signed)
 Spoke with [facility] ED RN, Bernice, EFM tracing has been appropriate for 26w. Monitoring is being discontinued. Pt will be discharged and will follow-up with Dr Jayne at his office in the morning.

## 2023-12-18 ENCOUNTER — Other Ambulatory Visit: Payer: Self-pay | Admitting: Advanced Practice Midwife

## 2023-12-18 DIAGNOSIS — O365920 Maternal care for other known or suspected poor fetal growth, second trimester, not applicable or unspecified: Secondary | ICD-10-CM

## 2023-12-19 ENCOUNTER — Encounter: Payer: Self-pay | Admitting: Women's Health

## 2023-12-19 ENCOUNTER — Ambulatory Visit (INDEPENDENT_AMBULATORY_CARE_PROVIDER_SITE_OTHER): Admitting: Women's Health

## 2023-12-19 ENCOUNTER — Ambulatory Visit

## 2023-12-19 ENCOUNTER — Encounter: Payer: Self-pay | Admitting: Obstetrics & Gynecology

## 2023-12-19 VITALS — BP 94/64 | HR 89 | Wt 101.6 lb

## 2023-12-19 DIAGNOSIS — O365920 Maternal care for other known or suspected poor fetal growth, second trimester, not applicable or unspecified: Secondary | ICD-10-CM

## 2023-12-19 DIAGNOSIS — Z3A26 26 weeks gestation of pregnancy: Secondary | ICD-10-CM | POA: Diagnosis not present

## 2023-12-19 DIAGNOSIS — N812 Incomplete uterovaginal prolapse: Secondary | ICD-10-CM | POA: Diagnosis not present

## 2023-12-19 DIAGNOSIS — O0992 Supervision of high risk pregnancy, unspecified, second trimester: Secondary | ICD-10-CM

## 2023-12-19 NOTE — Progress Notes (Signed)
 HIGH-RISK PREGNANCY VISIT Patient name: Mary Singleton MRN [id number]  Date of birth: 05-25-1995 Chief Complaint:   Routine Prenatal Visit (US  today)  History of Present Illness:   Mary Singleton is a 28 y.o. G74P0102 female at [redacted]w[redacted]d with an Estimated Date of Delivery: 03/21/24 being seen today for ongoing management of a high-risk pregnancy complicated by fetal growth restriction 3%, intermittently elevated UAD, cervical prolapse.    Today she reports no complaints. Contractions: Not present. Vag. Bleeding: None.  Movement: Present. denies leaking of fluid.      12/04/2023    3:14 PM 09/11/2023   11:13 AM 12/28/2022    9:24 AM 10/11/2020    2:54 PM 01/09/2017    2:56 PM  Depression screen PHQ 2/9  Decreased Interest 1 2 0 0 0  Down, Depressed, Hopeless 1 0 0 0 0  PHQ - 2 Score 2 2 0 0 0  Altered sleeping 0 2  0   Tired, decreased energy 2 2  0   Change in appetite 0 0  0   Feeling bad or failure about yourself  0 0  0   Trouble concentrating 0 0  1   Moving slowly or fidgety/restless 0 0  0   Suicidal thoughts 0 0  0   PHQ-9 Score 4 6  1    Difficult doing work/chores Not difficult at all            12/04/2023    3:15 PM 09/11/2023   11:13 AM 10/11/2020    2:54 PM  GAD 7 : Generalized Anxiety Score  Nervous, Anxious, on Edge 1 2 1   Control/stop worrying 1 0 0  Worry too much - different things 1 0 0  Trouble relaxing 0 0 0  Restless 0 0 0  Easily annoyed or irritable 1 0 1  Afraid - awful might happen 1 0 0  Total GAD 7 Score 5 2 2   Anxiety Difficulty Not difficult at all       Review of Systems:   Pertinent items are noted in HPI Denies abnormal vaginal discharge w/ itching/odor/irritation, headaches, visual changes, shortness of breath, chest pain, abdominal pain, severe nausea/vomiting, or problems with urination or bowel movements unless otherwise stated above. Pertinent History Reviewed:  Reviewed past medical,surgical, social, obstetrical and family history.   Reviewed problem list, medications and allergies. Physical Assessment:   Vitals:   12/19/23 0952  BP: 94/64  Pulse: 89  Weight: 101 lb 9.6 oz (46.1 kg)  Body mass index is 20.52 kg/m.           Physical Examination:   General appearance: alert, well appearing, and in no distress  Mental status: alert, oriented to person, place, and time  Skin: warm & dry   Extremities:      Cardiovascular: normal heart rate noted  Respiratory: normal respiratory effort, no distress  Abdomen: gravid, soft, non-tender  Pelvic: pessary removed and cleaned by Dr. Jayne then replaced w/o difficulty         Fetal Status:     Movement: Present    Fetal Surveillance Testing today: US  26+5 wks,breech,cx 3.2 cm,posterior fundal placenta gr 0,AFI 13 cm,FHR 166 bpm,intermittent elevated dopplers with EDF,RI .86,.87,.91,.79,.76=92%   Chaperone: me & Sherrell  No results found for this or any previous visit (from the past 24 hours).  Assessment & Plan:  High-risk pregnancy: G2P0102 at [redacted]w[redacted]d with an Estimated Date of Delivery: 03/21/24   1) FGR w/ intermittent elevated UAD,  average 92% today  2) Cervical prolapse, pessary placed by Dr. Jayne Saturday, removed today by Dr. Jayne and cleaned then replaced  Meds: No orders of the defined types were placed in this encounter.   Labs/procedures today: U/S and pessary removal/cleaning/replacement  Treatment Plan:    UAD EFW Testing Delivery  Elevated UAD Q 1wk Q 3wk 28w- weekly BPP 32w-add NST 37.0    Reviewed: Preterm labor symptoms and general obstetric precautions including but not limited to vaginal bleeding, contractions, leaking of fluid and fetal movement were reviewed in detail with the patient.  All questions were answered. Does have home bp cuff. Office bp cuff given: not applicable. Check bp weekly, let us  know if consistently >140 and/or >90.  Follow-up: Return for As scheduled.   Future Appointments  Date Time Provider Department Center   12/26/2023  8:30 AM [facility]  12/26/2023 10:00 AM [facility] - [facility] US  [facility] None  12/26/2023 10:50 AM Cresenzo-Dishmon, Cathlean, CNM [facility]  01/02/2024 10:00 AM [facility] - [facility] US  [facility] None  01/02/2024 10:50 AM Kizzie Suzen SAUNDERS, CNM [facility]    No orders of the defined types were placed in this encounter.  Suzen SAUNDERS Kizzie CNM, [facility] 12/19/2023 10:14 AM

## 2023-12-19 NOTE — Progress Notes (Signed)
 US  26+5 wks,breech,cx 3.2 cm,posterior fundal placenta gr 0,AFI 13 cm,FHR 166 bpm,intermittent elevated dopplers with EDF,RI .86,.87,.91,.79,.76=92%

## 2023-12-19 NOTE — Patient Instructions (Addendum)
 Mary Singleton, thank you for choosing our office today! We appreciate the opportunity to meet your healthcare needs. You may receive a short survey by mail, e-mail, or through [employer]. If you are happy with your care we would appreciate if you could take just a few minutes to complete the survey questions. We read all of your comments and take your feedback very seriously. Thank you again for choosing our office.  Center for [employer] Team at [facility]  [facility] at [facility] ([address] [place], [place] [address]) Entrance C, located off of [address] Free 24/7 valet parking   You will have your sugar test next visit.  Please do not eat or drink anything after midnight the night before you come, not even water.  You will be here for at least two hours.  Please make an appointment online for the bloodwork at [email or web address] for 8:00am (or as close to this as possible). Make sure you select the [facility] service center.   CLASSES: Go to [email or web address] to register for classes (childbirth, breastfeeding, waterbirth, infant CPR, daddy bootcamp, etc.)  Call the office [phone]) or go to [facility] if: You begin to have strong, frequent contractions Your water breaks.  Sometimes it is a big gush of fluid, sometimes it is just a trickle that keeps getting your panties wet or running down your legs You have vaginal bleeding.  It is normal to have a small amount of spotting if your cervix was checked.  You don't feel your baby moving like normal.  If you don't, get you something to eat and drink and lay down and focus on feeling your baby move.   If your baby is still not moving like normal, you should call the office or go to [facility].  Call the office [phone]) or go to [facility] hospital for these signs of pre-eclampsia: Severe headache that does not go away with Tylenol  Visual changes- seeing spots, double, blurred vision Pain under your right breast or  upper abdomen that does not go away with Tums or heartburn medicine Nausea and/or vomiting Severe swelling in your hands, feet, and face    [place] Pediatricians/Family Doctors [facility]): [address] Luba BROCKS, [phone]           [facility]: [address], [phone]                [facility]): [address], [phone]  [facility] Department: [address], [place], [phone]    [facility] Pediatricians/Family Doctors [facility]): [address], [phone] [facility]: [address] [place], [phone] [facility] [place]: [address], [phone]  [facility] Doctors  [facility]): [phone] [facility]: [address] [phone]   [facility] Doctors [facility]: [address] [phone]  [facility] Doctors  [facility]: [phone], [phone]  Home Blood Pressure Monitoring for Patients   Your provider has recommended that you check your blood pressure (BP) at least once a week at home. If you do not have a blood pressure cuff at home, one will be provided for you. Contact your provider if you have not received your monitor within 1 week.   Helpful Tips for Accurate Home Blood Pressure Checks  Don't smoke, exercise, or drink caffeine 30 minutes before checking  your BP Use the restroom before checking your BP (a full bladder can raise your pressure) Relax in a comfortable upright chair Feet on the ground Left arm resting comfortably on a flat surface at the level of your heart Legs uncrossed Back supported Sit quietly and don't talk Place the cuff on your bare arm Adjust snuggly, so that only two fingertips can fit between your skin and the top of the cuff Check 2 readings separated by at least one  minute Keep a log of your BP readings For a visual, please reference this diagram: http://ccnc.care/bpdiagram  Provider Name: [facility]     Phone: [phone]  Zone 1: ALL CLEAR  Continue to monitor your symptoms:  BP reading is less than 140 (top number) or less than 90 (bottom number)  No right upper stomach pain No headaches or seeing spots No feeling nauseated or throwing up No swelling in face and hands  Zone 2: CAUTION Call your doctor's office for any of the following:  BP reading is greater than 140 (top number) or greater than 90 (bottom number)  Stomach pain under your ribs in the middle or right side Headaches or seeing spots Feeling nauseated or throwing up Swelling in face and hands  Zone 3: EMERGENCY  Seek immediate medical care if you have any of the following:  BP reading is greater than160 (top number) or greater than 110 (bottom number) Severe headaches not improving with Tylenol  Serious difficulty catching your breath Any worsening symptoms from Zone 2   Second Trimester of Pregnancy The second trimester is from week 13 through week 28, months 4 through 6. The second trimester is often a time when you feel your best. Your body has also adjusted to being pregnant, and you begin to feel better physically. Usually, morning sickness has lessened or quit completely, you may have more energy, and you may have an increase in appetite. The second trimester is also a time when the fetus is growing rapidly. At the end of the sixth month, the fetus is about 9 inches long and weighs about 1 pounds. You will likely begin to feel the baby move (quickening) between 18 and 20 weeks of the pregnancy. BODY CHANGES Your body goes through many changes during pregnancy. The changes vary from woman to woman.  Your weight will continue to increase. You will notice your lower abdomen bulging out. You may begin to get stretch marks on your hips, abdomen, and breasts. You may  develop headaches that can be relieved by medicines approved by your health care provider. You may urinate more often because the fetus is pressing on your bladder. You may develop or continue to have heartburn as a result of your pregnancy. You may develop constipation because certain hormones are causing the muscles that push waste through your intestines to slow down. You may develop hemorrhoids or swollen, bulging veins (varicose veins). You may have back pain because of the weight gain and pregnancy hormones relaxing your joints between the bones in your pelvis and as a result of a shift in weight and the muscles that support your balance. Your breasts will continue to grow and be tender. Your gums may bleed and may be sensitive to brushing and flossing. Dark spots or blotches (chloasma, mask of pregnancy) may develop on your face. This will likely fade after the baby is born. A dark line from your belly button to the pubic area (linea nigra) may appear. This will likely fade after the  baby is born. You may have changes in your hair. These can include thickening of your hair, rapid growth, and changes in texture. Some women also have hair loss during or after pregnancy, or hair that feels dry or thin. Your hair will most likely return to normal after your baby is born. WHAT TO EXPECT AT YOUR PRENATAL VISITS During a routine prenatal visit: You will be weighed to make sure you and the fetus are growing normally. Your blood pressure will be taken. Your abdomen will be measured to track your baby's growth. The fetal heartbeat will be listened to. Any test results from the previous visit will be discussed. Your health care provider may ask you: How you are feeling. If you are feeling the baby move. If you have had any abnormal symptoms, such as leaking fluid, bleeding, severe headaches, or abdominal cramping. If you have any questions. Other tests that may be performed during your second  trimester include: Blood tests that check for: Low iron levels (anemia). Gestational diabetes (between 24 and 28 weeks). Rh antibodies. Urine tests to check for infections, diabetes, or protein in the urine. An ultrasound to confirm the proper growth and development of the baby. An amniocentesis to check for possible genetic problems. Fetal screens for spina bifida and Down syndrome. HOME CARE INSTRUCTIONS  Avoid all smoking, herbs, alcohol, and unprescribed drugs. These chemicals affect the formation and growth of the baby. Follow your health care provider's instructions regarding medicine use. There are medicines that are either safe or unsafe to take during pregnancy. Exercise only as directed by your health care provider. Experiencing uterine cramps is a good sign to stop exercising. Continue to eat regular, healthy meals. Wear a good support bra for breast tenderness. Do not use hot tubs, steam rooms, or saunas. Wear your seat belt at all times when driving. Avoid raw meat, uncooked cheese, cat litter boxes, and soil used by cats. These carry germs that can cause birth defects in the baby. Take your prenatal vitamins. Try taking a stool softener (if your health care provider approves) if you develop constipation. Eat more high-fiber foods, such as fresh vegetables or fruit and whole grains. Drink plenty of fluids to keep your urine clear or pale yellow. Take warm sitz baths to soothe any pain or discomfort caused by hemorrhoids. Use hemorrhoid cream if your health care provider approves. If you develop varicose veins, wear support hose. Elevate your feet for 15 minutes, 3-4 times a day. Limit salt in your diet. Avoid heavy lifting, wear low heel shoes, and practice good posture. Rest with your legs elevated if you have leg cramps or low back pain. Visit your dentist if you have not gone yet during your pregnancy. Use a soft toothbrush to brush your teeth and be gentle when you floss. A  sexual relationship may be continued unless your health care provider directs you otherwise. Continue to go to all your prenatal visits as directed by your health care provider. SEEK MEDICAL CARE IF:  You have dizziness. You have mild pelvic cramps, pelvic pressure, or nagging pain in the abdominal area. You have persistent nausea, vomiting, or diarrhea. You have a bad smelling vaginal discharge. You have pain with urination. SEEK IMMEDIATE MEDICAL CARE IF:  You have a fever. You are leaking fluid from your vagina. You have spotting or bleeding from your vagina. You have severe abdominal cramping or pain. You have rapid weight gain or loss. You have shortness of breath with chest pain. You  notice sudden or extreme swelling of your face, hands, ankles, feet, or legs. You have not felt your baby move in over an hour. You have severe headaches that do not go away with medicine. You have vision changes. Document Released: 02/13/2001 Document Revised: 02/24/2013 Document Reviewed: 04/22/2012 [facility] Patient Information 2015 [place], [place]. This information is not intended to replace advice given to you by your health care provider. Make sure you discuss any questions you have with your health care provider.

## 2023-12-20 ENCOUNTER — Ambulatory Visit (INDEPENDENT_AMBULATORY_CARE_PROVIDER_SITE_OTHER): Admitting: Obstetrics & Gynecology

## 2023-12-20 VITALS — BP 95/66 | HR 76

## 2023-12-20 DIAGNOSIS — O365920 Maternal care for other known or suspected poor fetal growth, second trimester, not applicable or unspecified: Secondary | ICD-10-CM

## 2023-12-20 DIAGNOSIS — Z3A26 26 weeks gestation of pregnancy: Secondary | ICD-10-CM | POA: Diagnosis not present

## 2023-12-20 DIAGNOSIS — O3442 Maternal care for other abnormalities of cervix, second trimester: Secondary | ICD-10-CM

## 2023-12-20 DIAGNOSIS — N812 Incomplete uterovaginal prolapse: Secondary | ICD-10-CM

## 2023-12-20 DIAGNOSIS — N8189 Other female genital prolapse: Secondary | ICD-10-CM | POA: Diagnosis not present

## 2023-12-20 NOTE — Progress Notes (Signed)
 Chief Complaint  Patient presents with   Pessary Check    Blood pressure 95/66, pulse 76, last menstrual period 06/15/2023.  KELSEE PRESLAR presents today for her pessary came out when she strained with a BM She put it back in but did not feel right On exam she put it anterior to the cervix  She uses a Milex ring with support #4 She reports no vaginal discharge and no vaginal bleeding   Likert scale(1 not bothersome -5 very bothersome)  :  1  Exam reveals no undue vaginal mucosal pressure of breakdown, no discharge and no vaginal bleeding.  Vaginal Epithelial Abnormality Classification System:   0 0    No abnormalities 1    Epithelial erythema 2    Granulation tissue 3    Epithelial break or erosion, 1 cm or less 4    Epithelial break or erosion, 1 cm or greater  The pessary is removed, cleaned and replaced without difficulty.      ICD-10-CM   1. Cervical prolapse  N81.2       Reconmmend stool softenre, dulcolax suppository + 5 prunes twice a day  MARJANI KOBEL will be sen back next week for OB visit  Vonn VEAR Inch, MD  12/20/2023 11:42 AM

## 2023-12-23 ENCOUNTER — Other Ambulatory Visit

## 2023-12-24 LAB — CBC
Hematocrit: 34.7 % (ref 34.0–46.6)
Hemoglobin: 10.8 g/dL — ABNORMAL LOW (ref 11.1–15.9)
MCH: 27.7 pg (ref 26.6–33.0)
MCHC: 31.1 g/dL — ABNORMAL LOW (ref 31.5–35.7)
MCV: 89 fL (ref 79–97)
Platelets: 209 x10E3/uL (ref 150–450)
RBC: 3.9 x10E6/uL (ref 3.77–5.28)
RDW: 12.8 % (ref 11.7–15.4)
WBC: 11.8 x10E3/uL — ABNORMAL HIGH (ref 3.4–10.8)

## 2023-12-24 LAB — GLUCOSE TOLERANCE, 2 HOURS W/ 1HR
Glucose, 1 hour: 120 mg/dL (ref 70–179)
Glucose, 2 hour: 98 mg/dL (ref 70–152)
Glucose, Fasting: 60 mg/dL — ABNORMAL LOW (ref 70–91)

## 2023-12-24 LAB — HIV ANTIBODY (ROUTINE TESTING W REFLEX): HIV Screen 4th Generation wRfx: NONREACTIVE

## 2023-12-24 LAB — ANTIBODY SCREEN: Antibody Screen: NEGATIVE

## 2023-12-24 LAB — RPR: RPR Ser Ql: NONREACTIVE

## 2023-12-25 ENCOUNTER — Other Ambulatory Visit: Payer: Self-pay | Admitting: Advanced Practice Midwife

## 2023-12-25 DIAGNOSIS — O365920 Maternal care for other known or suspected poor fetal growth, second trimester, not applicable or unspecified: Secondary | ICD-10-CM

## 2023-12-26 ENCOUNTER — Other Ambulatory Visit: Payer: Self-pay

## 2023-12-26 ENCOUNTER — Other Ambulatory Visit

## 2023-12-26 ENCOUNTER — Encounter (HOSPITAL_COMMUNITY): Payer: Self-pay | Admitting: Obstetrics and Gynecology

## 2023-12-26 ENCOUNTER — Inpatient Hospital Stay (HOSPITAL_COMMUNITY)
Admission: AD | Admit: 2023-12-26 | Discharge: 2023-12-26 | Disposition: A | Discharge status: Home / Self Care | Attending: Obstetrics and Gynecology | Admitting: Obstetrics and Gynecology

## 2023-12-26 ENCOUNTER — Encounter: Admitting: Advanced Practice Midwife

## 2023-12-26 DIAGNOSIS — O365920 Maternal care for other known or suspected poor fetal growth, second trimester, not applicable or unspecified: Secondary | ICD-10-CM | POA: Diagnosis not present

## 2023-12-26 DIAGNOSIS — Z3A27 27 weeks gestation of pregnancy: Secondary | ICD-10-CM | POA: Diagnosis not present

## 2023-12-26 DIAGNOSIS — O3442 Maternal care for other abnormalities of cervix, second trimester: Secondary | ICD-10-CM | POA: Diagnosis not present

## 2023-12-26 DIAGNOSIS — N898 Other specified noninflammatory disorders of vagina: Secondary | ICD-10-CM | POA: Diagnosis present

## 2023-12-26 DIAGNOSIS — O23592 Infection of other part of genital tract in pregnancy, second trimester: Secondary | ICD-10-CM | POA: Diagnosis not present

## 2023-12-26 DIAGNOSIS — Z3480 Encounter for supervision of other normal pregnancy, unspecified trimester: Secondary | ICD-10-CM

## 2023-12-26 DIAGNOSIS — O26892 Other specified pregnancy related conditions, second trimester: Secondary | ICD-10-CM

## 2023-12-26 DIAGNOSIS — Z131 Encounter for screening for diabetes mellitus: Secondary | ICD-10-CM

## 2023-12-26 DIAGNOSIS — B3731 Acute candidiasis of vulva and vagina: Secondary | ICD-10-CM

## 2023-12-26 DIAGNOSIS — Z3490 Encounter for supervision of normal pregnancy, unspecified, unspecified trimester: Secondary | ICD-10-CM

## 2023-12-26 DIAGNOSIS — O98812 Other maternal infectious and parasitic diseases complicating pregnancy, second trimester: Secondary | ICD-10-CM | POA: Diagnosis not present

## 2023-12-26 LAB — URINALYSIS, ROUTINE W REFLEX MICROSCOPIC
Bilirubin Urine: NEGATIVE
Glucose, UA: NEGATIVE mg/dL
Hgb urine dipstick: NEGATIVE
Ketones, ur: NEGATIVE mg/dL
Leukocytes,Ua: NEGATIVE
Nitrite: NEGATIVE
Protein, ur: NEGATIVE mg/dL
Specific Gravity, Urine: 1.005 (ref 1.005–1.030)
pH: 7 (ref 5.0–8.0)

## 2023-12-26 LAB — GC/CHLAMYDIA PROBE AMP (~~LOC~~) NOT AT ARMC
Chlamydia: NEGATIVE
Comment: NEGATIVE
Comment: NORMAL
Neisseria Gonorrhea: NEGATIVE

## 2023-12-26 LAB — WET PREP, GENITAL
Clue Cells Wet Prep HPF POC: NONE SEEN
Sperm: NONE SEEN
Trich, Wet Prep: NONE SEEN
WBC, Wet Prep HPF POC: <10 (ref <10)

## 2023-12-26 LAB — POCT FERN TEST: POCT Fern Test: NEGATIVE

## 2023-12-26 LAB — RUPTURE OF MEMBRANE (ROM)PLUS: Rom Plus: NEGATIVE

## 2023-12-26 MED ORDER — CLOTRIMAZOLE 1 % VA CREA: 1.0000 | TOPICAL_CREAM | Freq: Every day | VAGINAL | 2 refills | Status: AC

## 2023-12-26 NOTE — MAU Provider Note (Signed)
 Chief Complaint:  Rupture of Membranes   HPI   Event Date/Time   First Provider Initiated Contact with Patient 12/26/23 0045      Mary Singleton is a 28 y.o. G2P0102 at [redacted]w[redacted]d who presents to maternity admissions reporting leakage of fluid. Felt a gush of fluid around 10:30pm, no continued leakage since then. Also noted concern that her pessary may be sitting at the opening of her vagina. Had an appointment on 10/17 for replacement of pessary. Denies contractions, vaginal bleeding, or decreased fetal movement.  Pregnancy Course: Follows at [facility] for pregnancy. Complicated by FGR, cervical prolapse and HSV.  Past Medical History:  Diagnosis Date   Medical history non-contributory    OB History  Gravida Para Term Preterm AB Living  2 1 0 1 0 2  SAB IAB Ectopic Multiple Live Births  0 0 0 2 2    # Outcome Date GA Lbr Len/2nd Weight Sex Type Anes PTL Lv  2 Current           1A Preterm 03/09/21 [redacted]w[redacted]d / 00:39 1890 g F Vag-Spont EPI  LIV  1B Preterm 03/10/21 [redacted]w[redacted]d / 01:12 1990 g M Vag-Spont EPI  LIV   Past Surgical History:  Procedure Laterality Date   BIOPSY  08/16/2017   Procedure: BIOPSY;  Surgeon: Golda Claudis PENNER, MD;  [facility];  Service: Endoscopy;;  duodenum   ESOPHAGOGASTRODUODENOSCOPY (EGD) WITH PROPOFOL  N/A 08/16/2017   Procedure: ESOPHAGOGASTRODUODENOSCOPY (EGD) WITH PROPOFOL ;  Surgeon: Golda Claudis PENNER, MD;  Location: [facility];  Service: Endoscopy;  Laterality: N/A;  8:30   EYE SURGERY     Family History  Problem Relation Age of Onset   Cancer Maternal Grandmother    Healthy Mother    Social History   Tobacco Use   Smoking status: Never   Smokeless tobacco: Never  Vaping Use   Vaping status: Never Used  Substance Use Topics   Alcohol use: No   Drug use: No   Allergies  Allergen Reactions   Pineapple Itching and Swelling   Meloxicam  Rash   No medications prior to admission.    I have reviewed patient's Past Medical Hx, Surgical  Hx, Family Hx, Social Hx, medications and allergies.   ROS  Pertinent items noted in HPI and remainder of comprehensive ROS otherwise negative.   PHYSICAL EXAM  Patient Vitals for the past 24 hrs:  BP Temp Temp src Pulse Resp SpO2 Height Weight  12/26/23 0027 (!) 105/57 -- -- 89 -- -- -- --  12/26/23 0013 107/61 98.4 F (36.9 C) Oral 97 16 100 % 4' 11 (1.499 m) 46.6 kg    Constitutional: Well-developed, well-nourished female in no acute distress.  Cardiovascular: Warm and well-perfused Respiratory: normal effort, no problems with respiration noted GI: Gravid MS: Extremities nontender, no edema, normal ROM Neurologic: Alert and oriented x 4.  Pelvic: normal external female genitalia, thick yellow discharge, no blood, pessary in place     Fetal Tracing: Baseline: 155 bpm Variability: moderate Accelerations: present Decelerations: absent Toco: flat   Labs: Results for orders placed or performed during the hospital encounter of 12/26/23 (from the past 24 hours)  Urinalysis, Routine w reflex microscopic -Urine, Clean Catch     Status: Abnormal   Collection Time: 12/26/23 12:27 AM  Result Value Ref Range   Color, Urine YELLOW YELLOW   APPearance HAZY (A) CLEAR   Specific Gravity, Urine 1.005 1.005 - 1.030   pH 7.0 5.0 - 8.0  Glucose, UA NEGATIVE NEGATIVE mg/dL   Hgb urine dipstick NEGATIVE NEGATIVE   Bilirubin Urine NEGATIVE NEGATIVE   Ketones, ur NEGATIVE NEGATIVE mg/dL   Protein, ur NEGATIVE NEGATIVE mg/dL   Nitrite NEGATIVE NEGATIVE   Leukocytes,Ua NEGATIVE NEGATIVE  Fern Test     Status: None   Collection Time: 12/26/23 12:34 AM  Result Value Ref Range   POCT Fern Test Negative = intact amniotic membranes   Wet prep, genital     Status: Abnormal   Collection Time: 12/26/23  1:00 AM  Result Value Ref Range   Yeast Wet Prep HPF POC PRESENT (A) NONE SEEN   Trich, Wet Prep NONE SEEN NONE SEEN   Clue Cells Wet Prep HPF POC NONE SEEN NONE SEEN   WBC, Wet Prep HPF POC  <10 <10   Sperm NONE SEEN   Rupture of Membrane (ROM) Plus     Status: None   Collection Time: 12/26/23  1:00 AM  Result Value Ref Range   Rom Plus NEGATIVE     Imaging:  No results found.  MDM & MAU COURSE  MDM: Moderate  MAU Course: Orders Placed This Encounter  Procedures   Wet prep, genital   Urinalysis, Routine w reflex microscopic -Urine, Clean Catch   Rupture of Membrane (ROM) Plus   Fern Test   Discharge patient   Meds ordered this encounter  Medications   clotrimazole (GYNE-LOTRIMIN) 1 % vaginal cream    Sig: Place 1 Applicatorful vaginally at bedtime for 7 days.    Dispense:  30 g    Refill:  2   VSS. Pelvic exam showing yellow discharge concerning for infection, pessary appears to be in place. Negative fern and ROM plus. Wet prep positive for yeast. Discussed results and medication sent for infection. Has OB appointment 10/23. All questions answered prior to discharge.  ASSESSMENT   1. [redacted] weeks gestation of pregnancy   2. Intact amniotic membranes, antepartum   3. Yeast infection of the vagina     PLAN  Discharge home in stable condition with return precautions.  Attend OB appointment 10/23.    Allergies as of 12/26/2023       Reactions   Pineapple Itching, Swelling   Meloxicam  Rash        Medication List     STOP taking these medications    amoxicillin-clavulanate 500-125 MG tablet Commonly known as: Augmentin   lidocaine  2 % solution Commonly known as: XYLOCAINE    ondansetron  4 MG tablet Commonly known as: Zofran        TAKE these medications    Blood Pressure Monitor Misc For regular home bp monitoring during pregnancy   clotrimazole 1 % vaginal cream Commonly known as: GYNE-LOTRIMIN Place 1 Applicatorful vaginally at bedtime for 7 days.   OB Complete Petite 35-5-1-200 MG Caps Take 1 tablet by mouth daily. BIN:  [id number]   PCN:  [id number]     GRP:  [id number]    ID:  [id number]   valACYclovir  500 MG tablet Commonly known as:  Valtrex  Take 1 tablet (500 mg total) by mouth 2 (two) times daily.        Charlie Courts, MD  Family Medicine - Obstetrics Fellow

## 2023-12-26 NOTE — Discharge Instructions (Signed)
 You came into the hospital because you were concerned about breaking your water. We did two different tests which were negative. We did another swab that was positive for a yeast infection. You can use the vaginal cream to help with the infection.

## 2023-12-26 NOTE — MAU Note (Signed)
 Mary Singleton is a 28 y.o. at [redacted]w[redacted]d here in MAU reporting: was laying in the bed - thought she had to pee so she went to the restroom and had a gush of fluid come out. States I have a plastic piece on my cervix and it slid down. It's about to fall out. Continuing to leak clear watery fluid. Denies VB. Reports some lower abdominal cramping. +FM   LMP: NA Onset of complaint: 2230 Pain score: 4 Vitals:   12/26/23 0013  BP: 107/61  Pulse: 97  Resp: 16  Temp: 98.4 F (36.9 C)  SpO2: 100%     FHT: 168  Lab orders placed from triage: fern; UA

## 2024-01-02 ENCOUNTER — Encounter: Admitting: Women's Health

## 2024-01-02 ENCOUNTER — Encounter: Payer: Self-pay | Admitting: Women's Health

## 2024-01-02 ENCOUNTER — Ambulatory Visit (INDEPENDENT_AMBULATORY_CARE_PROVIDER_SITE_OTHER): Admitting: Women's Health

## 2024-01-02 ENCOUNTER — Ambulatory Visit (INDEPENDENT_AMBULATORY_CARE_PROVIDER_SITE_OTHER)

## 2024-01-02 ENCOUNTER — Other Ambulatory Visit

## 2024-01-02 ENCOUNTER — Other Ambulatory Visit: Payer: Self-pay | Admitting: Advanced Practice Midwife

## 2024-01-02 VITALS — BP 92/67 | HR 105 | Wt 102.4 lb

## 2024-01-02 DIAGNOSIS — Z3480 Encounter for supervision of other normal pregnancy, unspecified trimester: Secondary | ICD-10-CM

## 2024-01-02 DIAGNOSIS — O365920 Maternal care for other known or suspected poor fetal growth, second trimester, not applicable or unspecified: Secondary | ICD-10-CM

## 2024-01-02 DIAGNOSIS — Z3A28 28 weeks gestation of pregnancy: Secondary | ICD-10-CM | POA: Diagnosis not present

## 2024-01-02 DIAGNOSIS — O0992 Supervision of high risk pregnancy, unspecified, second trimester: Secondary | ICD-10-CM

## 2024-01-02 DIAGNOSIS — O365930 Maternal care for other known or suspected poor fetal growth, third trimester, not applicable or unspecified: Secondary | ICD-10-CM

## 2024-01-02 DIAGNOSIS — Z3A25 25 weeks gestation of pregnancy: Secondary | ICD-10-CM

## 2024-01-02 DIAGNOSIS — B9689 Other specified bacterial agents as the cause of diseases classified elsewhere: Secondary | ICD-10-CM

## 2024-01-02 DIAGNOSIS — O0993 Supervision of high risk pregnancy, unspecified, third trimester: Secondary | ICD-10-CM | POA: Diagnosis not present

## 2024-01-02 NOTE — Patient Instructions (Signed)
Mary Singleton, thank you for choosing our office today! We appreciate the opportunity to meet your healthcare needs. You may receive a short survey by mail, e-mail, or through MyChart. If you are happy with your care we would appreciate if you could take just a few minutes to complete the survey questions. We read all of your comments and take your feedback very seriously. Thank you again for choosing our office.  [facility] ([address] [place], [place] [address]) Entrance C, located off of [address] Free 24/7 valet parking   CLASSES: Go to [email or web address] to register for classes (childbirth, breastfeeding, waterbirth, infant CPR, daddy bootcamp, etc.)  Call the office ([phone]) or go to [facility] if: You begin to have strong, frequent contractions Your water breaks.  Sometimes it is a big gush of fluid, sometimes it is just a trickle that keeps getting your panties wet or running down your legs You have vaginal bleeding.  It is normal to have a small amount of spotting if your cervix was checked.  You don't feel your baby moving like normal.  If you don't, get you something to eat and drink and lay down and focus on feeling your baby move.   If your baby is still not moving like normal, you should call the office or go to [facility].  Call the office ([phone]) or go to [facility] for these signs of pre-eclampsia: Severe headache that does not go away with Tylenol Visual changes- seeing spots, double, blurred vision Pain under your right breast or upper abdomen that does not go away with Tums or heartburn medicine Nausea and/or vomiting Severe swelling in your hands, feet, and face   Tdap Vaccine It is recommended that you get the Tdap vaccine during the third trimester of EACH pregnancy to help protect your baby from getting pertussis (whooping cough) 27-36 weeks is the BEST time to do  this so that you can pass the protection on to your baby. During pregnancy is better than after pregnancy, but if you are unable to get it during pregnancy it will be offered at the hospital.  You can get this vaccine with us, at the health department, your family doctor, or some local pharmacies Everyone who will be around your baby should also be up-to-date on their vaccines before the baby comes. Adults (who are not pregnant) only need 1 dose of Tdap during adulthood.   [place] Pediatricians/Family Doctors [facility] ([facility]): [address], [phone]           [facility]: [address], [phone]                [facility] ([facility]): [address], [phone] (call to ask if accepting patients) [facility]: [address], [place], [phone]    [place] Pediatricians/Family Doctors [facility] ([facility]): [address], [phone] [facility]: [address], [phone] [facility]: [address], [phone]  [place] [facility]  [facility] ([facility]): [phone] [facility]: [address], [phone]   [place] Family Doctors [facility]: [address], [phone]  [place] [facility]  [facility]: [address], [phone]  Home Blood Pressure Monitoring for Patients   Your provider has recommended that you check your   blood pressure (BP) at least once a week at home. If you do not have a blood pressure cuff at home, one will be provided for you. Contact your provider if you have not received your monitor within 1 week.   Helpful Tips for Accurate Home Blood Pressure Checks  Don't smoke, exercise, or drink caffeine 30 minutes before checking your BP Use the restroom before checking your BP (a full bladder can raise your  pressure) Relax in a comfortable upright chair Feet on the ground Left arm resting comfortably on a flat surface at the level of your heart Legs uncrossed Back supported Sit quietly and don't talk Place the cuff on your bare arm Adjust snuggly, so that only two fingertips can fit between your skin and the top of the cuff Check 2 readings separated by at least one minute Keep a log of your BP readings For a visual, please reference this diagram: http://ccnc.care/bpdiagram  Provider Name: [facility]     Phone: [phone]  Zone 1: ALL CLEAR  Continue to monitor your symptoms:  BP reading is less than 140 (top number) or less than 90 (bottom number)  No right upper stomach pain No headaches or seeing spots No feeling nauseated or throwing up No swelling in face and hands  Zone 2: CAUTION Call your doctor's office for any of the following:  BP reading is greater than 140 (top number) or greater than 90 (bottom number)  Stomach pain under your ribs in the middle or right side Headaches or seeing spots Feeling nauseated or throwing up Swelling in face and hands  Zone 3: EMERGENCY  Seek immediate medical care if you have any of the following:  BP reading is greater than160 (top number) or greater than 110 (bottom number) Severe headaches not improving with Tylenol Serious difficulty catching your breath Any worsening symptoms from Zone 2   Third Trimester of Pregnancy The third trimester is from week 29 through week 42, months 7 through 9. The third trimester is a time when the fetus is growing rapidly. At the end of the ninth month, the fetus is about 20 inches in length and weighs 6-10 pounds.  BODY CHANGES Your body goes through many changes during pregnancy. The changes vary from woman to woman.  Your weight will continue to increase. You can expect to gain 25-35 pounds (11-16 kg) by the end of the pregnancy. You may begin to get stretch marks on your hips, abdomen,  and breasts. You may urinate more often because the fetus is moving lower into your pelvis and pressing on your bladder. You may develop or continue to have heartburn as a result of your pregnancy. You may develop constipation because certain hormones are causing the muscles that push waste through your intestines to slow down. You may develop hemorrhoids or swollen, bulging veins (varicose veins). You may have pelvic pain because of the weight gain and pregnancy hormones relaxing your joints between the bones in your pelvis. Backaches may result from overexertion of the muscles supporting your posture. You may have changes in your hair. These can include thickening of your hair, rapid growth, and changes in texture. Some women also have hair loss during or after pregnancy, or hair that feels dry or thin. Your hair will most likely return to normal after your baby is born. Your breasts will continue to grow and be tender. A yellow discharge may leak from your breasts called colostrum. Your belly button may stick out. You may   feel short of breath because of your expanding uterus. You may notice the fetus "dropping," or moving lower in your abdomen. You may have a bloody mucus discharge. This usually occurs a few days to a week before labor begins. Your cervix becomes thin and soft (effaced) near your due date. WHAT TO EXPECT AT YOUR PRENATAL EXAMS  You will have prenatal exams every 2 weeks until week 36. Then, you will have weekly prenatal exams. During a routine prenatal visit: You will be weighed to make sure you and the fetus are growing normally. Your blood pressure is taken. Your abdomen will be measured to track your baby's growth. The fetal heartbeat will be listened to. Any test results from the previous visit will be discussed. You may have a cervical check near your due date to see if you have effaced. At around 36 weeks, your caregiver will check your cervix. At the same time, your  caregiver will also perform a test on the secretions of the vaginal tissue. This test is to determine if a type of bacteria, Group B streptococcus, is present. Your caregiver will explain this further. Your caregiver may ask you: What your birth plan is. How you are feeling. If you are feeling the baby move. If you have had any abnormal symptoms, such as leaking fluid, bleeding, severe headaches, or abdominal cramping. If you have any questions. Other tests or screenings that may be performed during your third trimester include: Blood tests that check for low iron levels (anemia). Fetal testing to check the health, activity level, and growth of the fetus. Testing is done if you have certain medical conditions or if there are problems during the pregnancy. FALSE LABOR You may feel small, irregular contractions that eventually go away. These are called Braxton Hicks contractions, or false labor. Contractions may last for hours, days, or even weeks before true labor sets in. If contractions come at regular intervals, intensify, or become painful, it is best to be seen by your caregiver.  SIGNS OF LABOR  Menstrual-like cramps. Contractions that are 5 minutes apart or less. Contractions that start on the top of the uterus and spread down to the lower abdomen and back. A sense of increased pelvic pressure or back pain. A watery or bloody mucus discharge that comes from the vagina. If you have any of these signs before the 37th week of pregnancy, call your caregiver right away. You need to go to the hospital to get checked immediately. HOME CARE INSTRUCTIONS  Avoid all smoking, herbs, alcohol, and unprescribed drugs. These chemicals affect the formation and growth of the baby. Follow your caregiver's instructions regarding medicine use. There are medicines that are either safe or unsafe to take during pregnancy. Exercise only as directed by your caregiver. Experiencing uterine cramps is a good sign to  stop exercising. Continue to eat regular, healthy meals. Wear a good support bra for breast tenderness. Do not use hot tubs, steam rooms, or saunas. Wear your seat belt at all times when driving. Avoid raw meat, uncooked cheese, cat litter boxes, and soil used by cats. These carry germs that can cause birth defects in the baby. Take your prenatal vitamins. Try taking a stool softener (if your caregiver approves) if you develop constipation. Eat more high-fiber foods, such as fresh vegetables or fruit and whole grains. Drink plenty of fluids to keep your urine clear or pale yellow. Take warm sitz baths to soothe any pain or discomfort caused by hemorrhoids. Use hemorrhoid cream if   your caregiver approves. If you develop varicose veins, wear support hose. Elevate your feet for 15 minutes, 3-4 times a day. Limit salt in your diet. Avoid heavy lifting, wear low heal shoes, and practice good posture. Rest a lot with your legs elevated if you have leg cramps or low back pain. Visit your dentist if you have not gone during your pregnancy. Use a soft toothbrush to brush your teeth and be gentle when you floss. A sexual relationship may be continued unless your caregiver directs you otherwise. Do not travel far distances unless it is absolutely necessary and only with the approval of your caregiver. Take prenatal classes to understand, practice, and ask questions about the labor and delivery. Make a trial run to the hospital. Pack your hospital bag. Prepare the baby's nursery. Continue to go to all your prenatal visits as directed by your caregiver. SEEK MEDICAL CARE IF: You are unsure if you are in labor or if your water has broken. You have dizziness. You have mild pelvic cramps, pelvic pressure, or nagging pain in your abdominal area. You have persistent nausea, vomiting, or diarrhea. You have a bad smelling vaginal discharge. You have pain with urination. SEEK IMMEDIATE MEDICAL CARE IF:  You  have a fever. You are leaking fluid from your vagina. You have spotting or bleeding from your vagina. You have severe abdominal cramping or pain. You have rapid weight loss or gain. You have shortness of breath with chest pain. You notice sudden or extreme swelling of your face, hands, ankles, feet, or legs. You have not felt your baby move in over an hour. You have severe headaches that do not go away with medicine. You have vision changes. Document Released: 02/13/2001 Document Revised: 02/24/2013 Document Reviewed: 04/22/2012 ExitCare Patient Information 2015 [employer]. This information is not intended to replace advice given to you by your health care provider. Make sure you discuss any questions you have with your health care provider.       

## 2024-01-02 NOTE — Progress Notes (Signed)
 HIGH-RISK PREGNANCY VISIT Patient name: Mary Singleton MRN [id number]  Date of birth: Jun 07, 1995 Chief Complaint:   Routine Prenatal Visit, Pregnancy Ultrasound, and Non-stress Test  History of Present Illness:   Mary Singleton is a 28 y.o. G71P0102 female at [redacted]w[redacted]d with an Estimated Date of Delivery: 03/21/24 being seen today for ongoing management of a high-risk pregnancy complicated by fetal growth restriction 2%, UAD 91%, cervical prolapse-has pessary.    Today she reports no complaints. Contractions: Not present.  .  Movement: Present. denies leaking of fluid.      12/04/2023    3:14 PM 09/11/2023   11:13 AM 12/28/2022    9:24 AM 10/11/2020    2:54 PM 01/09/2017    2:56 PM  Depression screen PHQ 2/9  Decreased Interest 1 2 0 0 0  Down, Depressed, Hopeless 1 0 0 0 0  PHQ - 2 Score 2 2 0 0 0  Altered sleeping 0 2  0   Tired, decreased energy 2 2  0   Change in appetite 0 0  0   Feeling bad or failure about yourself  0 0  0   Trouble concentrating 0 0  1   Moving slowly or fidgety/restless 0 0  0   Suicidal thoughts 0 0  0   PHQ-9 Score 4 6  1    Difficult doing work/chores Not difficult at all            12/04/2023    3:15 PM 09/11/2023   11:13 AM 10/11/2020    2:54 PM  GAD 7 : Generalized Anxiety Score  Nervous, Anxious, on Edge 1 2 1   Control/stop worrying 1 0 0  Worry too much - different things 1 0 0  Trouble relaxing 0 0 0  Restless 0 0 0  Easily annoyed or irritable 1 0 1  Afraid - awful might happen 1 0 0  Total GAD 7 Score 5 2 2   Anxiety Difficulty Not difficult at all       Review of Systems:   Pertinent items are noted in HPI Denies abnormal vaginal discharge w/ itching/odor/irritation, headaches, visual changes, shortness of breath, chest pain, abdominal pain, severe nausea/vomiting, or problems with urination or bowel movements unless otherwise stated above. Pertinent History Reviewed:  Reviewed past medical,surgical, social, obstetrical and family  history.  Reviewed problem list, medications and allergies. Physical Assessment:   Vitals:   01/02/24 1431  BP: 92/67  Pulse: (!) 105  Weight: 102 lb 6.4 oz (46.4 kg)  Body mass index is 20.68 kg/m.           Physical Examination:   General appearance: alert, well appearing, and in no distress  Mental status: alert, oriented to person, place, and time  Skin: warm & dry   Extremities: Edema: None    Cardiovascular: normal heart rate noted  Respiratory: normal respiratory effort, no distress  Abdomen: gravid, soft, non-tender  Pelvic: Cervical exam deferred         Fetal Status:     Movement: Present    Fetal Surveillance Testing today: US  28+5 wks,complete breech,posterior fundal placenta gr 0,BPP 6/8 no breathing,FHR 145 bpm,AFI 11 cm,EFW 971 g 2% AC 5%,elevated UAD with EDF,RI .74,.76,.78=91%,discussed results with Luke Fetters   NST (d/t bpp 6/8): FHR baseline 155 bpm, Variability: moderate, Accelerations:present, Decelerations:  Absent= Cat 1/reactive Toco: none   Chaperone: N/A  No results found for this or any previous visit (from the past 24 hours).  Assessment &  Plan:  High-risk pregnancy: G2P0102 at [redacted]w[redacted]d with an Estimated Date of Delivery: 03/21/24   1) FGR 2% w/ UAD 91%, bpp 6/8 (-2 for breathing), Reactive NST= 10/10  2) Cervical prolapse, has pessary, was cleaned last week  3) Wants BTS>reviewed risks/benefits, discussed high incidence regret <30yo if appropriate, LARCs just as effective, consent signed today   Meds: No orders of the defined types were placed in this encounter.  Labs/procedures today: NST and U/S  Treatment Plan:    UAD EFW Testing Delivery  EFW/AC <3% Q 1wk Q 3wk 28w-weekly BPP 32w-add NST 37.0  Elevated UAD Q 1wk Q 3wk 28w- weekly BPP 32w-add NST 37.0     Reviewed: Preterm labor symptoms and general obstetric precautions including but not limited to vaginal bleeding, contractions, leaking of fluid and fetal movement were reviewed in  detail with the patient.  All questions were answered. Does have home bp cuff. Office bp cuff given: not applicable. Check bp weekly, let us  know if consistently >140 and/or >90.  Follow-up: Return in about 1 week (around 01/09/2024) for As scheduled.   Future Appointments  Date Time Provider Department Center  01/09/2024  1:30 PM [facility] US  [facility] None  01/09/2024  2:30 PM Marilynn Nest, DO [facility]  01/15/2024  3:00 PM [facility]  [facility] None  01/15/2024  3:50 PM Loreli Suzen BIRCH, CNM [facility]  01/22/2024  1:30 PM [facility] US  [facility] None  01/22/2024  2:30 PM Marilynn Nest, DO [facility]  01/28/2024  2:15 PM [facility] US  [facility] None  01/28/2024  2:30 PM Marilynn Nest, DO [facility]  02/03/2024  1:30 PM [facility] US  [facility] None  02/03/2024  2:30 PM Marilynn Nest, DO [facility]  02/06/2024  3:30 PM [facility]  02/10/2024  2:15 PM [facility]  [facility] None  02/10/2024  3:10 PM Jayne Vonn DEL, MD [facility]  02/13/2024  3:30 PM [facility]  02/17/2024  2:15 PM [facility] 2 [facility] None  02/17/2024  3:10 PM Kizzie Suzen SAUNDERS, CNM [facility]  02/20/2024 11:10 AM [facility]  02/24/2024  2:15 PM [facility] 2 [facility] None  02/24/2024  3:10 PM Jayne Vonn DEL, MD [facility]  03/02/2024  3:00 PM [facility] 2 [facility] None  03/02/2024  3:50 PM Kizzie Suzen SAUNDERS, CNM [facility]  03/09/2024  3:00 PM [facility] US  [facility] None  03/12/2024  3:30 PM [facility]  03/16/2024  3:00 PM [facility] FT IMG 2 [facility] None  03/19/2024  3:30 PM [facility]    No orders of the defined types were placed in this encounter.  Suzen SAUNDERS Kizzie CNM, [facility] 01/02/2024 3:38 PM

## 2024-01-02 NOTE — Progress Notes (Addendum)
 US  28+5 wks,complete breech,posterior fundal placenta gr 0,BPP 6/8 no breathing,FHR 145 bpm,AFI 11 cm,EFW 971 g 2% AC 5%, elevated UAD with EDF,RI .74,.76,.78=91%,discussed results with Luke Fetters

## 2024-01-03 ENCOUNTER — Encounter: Payer: Self-pay | Admitting: Women's Health

## 2024-01-09 ENCOUNTER — Encounter: Admitting: Women's Health

## 2024-01-09 ENCOUNTER — Other Ambulatory Visit

## 2024-01-09 ENCOUNTER — Encounter: Payer: Self-pay | Admitting: Obstetrics & Gynecology

## 2024-01-09 ENCOUNTER — Ambulatory Visit

## 2024-01-09 ENCOUNTER — Ambulatory Visit (INDEPENDENT_AMBULATORY_CARE_PROVIDER_SITE_OTHER): Admitting: Obstetrics & Gynecology

## 2024-01-09 VITALS — BP 100/68 | HR 94 | Wt 104.0 lb

## 2024-01-09 DIAGNOSIS — O365930 Maternal care for other known or suspected poor fetal growth, third trimester, not applicable or unspecified: Secondary | ICD-10-CM | POA: Diagnosis not present

## 2024-01-09 DIAGNOSIS — Z3A29 29 weeks gestation of pregnancy: Secondary | ICD-10-CM | POA: Diagnosis not present

## 2024-01-09 DIAGNOSIS — O0993 Supervision of high risk pregnancy, unspecified, third trimester: Secondary | ICD-10-CM

## 2024-01-09 DIAGNOSIS — O365920 Maternal care for other known or suspected poor fetal growth, second trimester, not applicable or unspecified: Secondary | ICD-10-CM

## 2024-01-09 NOTE — Progress Notes (Signed)
 US  29+5 wks,frank breech,posterior placenta gr 0,FHR 162 bpm,AFI 10 cm,BPP 8/8,RI .69,.71,.71=78%

## 2024-01-09 NOTE — Progress Notes (Signed)
 HIGH-RISK PREGNANCY VISIT Patient name: Mary Singleton MRN [id number]  Date of birth: [birth year] Chief Complaint:   Routine Prenatal Visit  History of Present Illness:   Mary Singleton is a 28 y.o. G4P0102 female at 108w5d with an Estimated Date of Delivery: 03/21/24 being seen today for ongoing management of a high-risk pregnancy complicated by:  1) FGR 2) h.o HSV 3) Cervical prolapse- pessary in place  Today she reports no complaints.   Contractions: Not present. Vag. Bleeding: None.  Movement: Present. denies leaking of fluid.      12/04/2023    3:14 PM 09/11/2023   11:13 AM 12/28/2022    9:24 AM 10/11/2020    2:54 PM 01/09/2017    2:56 PM  Depression screen PHQ 2/9  Decreased Interest 1 2 0 0 0  Down, Depressed, Hopeless 1 0 0 0 0  PHQ - 2 Score 2 2 0 0 0  Altered sleeping 0 2  0   Tired, decreased energy 2 2  0   Change in appetite 0 0  0   Feeling bad or failure about yourself  0 0  0   Trouble concentrating 0 0  1   Moving slowly or fidgety/restless 0 0  0   Suicidal thoughts 0 0  0   PHQ-9 Score 4  6   1     Difficult doing work/chores Not difficult at all         Data saved with a previous flowsheet row definition     Current Outpatient Medications  Medication Instructions   Blood Pressure Monitor MISC For regular home bp monitoring during pregnancy   docusate sodium  (COLACE) 100 mg, 2 times daily   Prenat-FeCbn-FeAspGl-FA-Omega (OB COMPLETE PETITE) 35-5-1-200 MG CAPS 1 tablet, Oral, Daily, BIN:  [id number]   PCN:  CN     GRP:  [id number]    ID:  [id number]   valACYclovir  (VALTREX ) 500 mg, Oral, 2 times daily     Review of Systems:   Pertinent items are noted in HPI Denies abnormal vaginal discharge w/ itching/odor/irritation, headaches, visual changes, shortness of breath, chest pain, abdominal pain, severe nausea/vomiting, or problems with urination or bowel movements unless otherwise stated above. Pertinent History Reviewed:  Reviewed past medical,surgical,  social, obstetrical and family history.  Reviewed problem list, medications and allergies. Physical Assessment:   Vitals:   01/09/24 1405  BP: 100/68  Pulse: 94  Weight: 104 lb (47.2 kg)  Body mass index is 21.01 kg/m.           Physical Examination:   General appearance: alert, well appearing, and in no distress  Mental status: normal mood, behavior, speech, dress, motor activity, and thought processes  Skin: warm & dry   Extremities: Edema: None    Cardiovascular: normal heart rate noted  Respiratory: normal respiratory effort, no distress  Abdomen: gravid, soft, non-tender  Pelvic: Cervical exam deferred         Fetal Status:     Movement: Present    Fetal Surveillance Testing today: frank breech,posterior placenta gr 0,FHR 162 bpm,AFI 10 cm,BPP 8/8,RI .69,.71,.71=78%    Chaperone: N/A    No results found for this or any previous visit (from the past 24 hours).   Assessment & Plan:  High-risk pregnancy: G2P0102 at [redacted]w[redacted]d with an Estimated Date of Delivery: 03/21/24   1) FGR Normal testing today- BPP 8/8, normal dopplers Continue antepartum testing and serial growth scans  2) h.o HSV 3) Cervical prolapse- pessary in  place  Meds: No orders of the defined types were placed in this encounter.   Labs/procedures today: BPP/dopplers  Treatment Plan: Routine OB care and as outlined above  Reviewed: Preterm labor symptoms and general obstetric precautions including but not limited to vaginal bleeding, contractions, leaking of fluid and fetal movement were reviewed in detail with the patient.  All questions were answered.   Follow-up: Return in about 2 weeks (around 01/23/2024) for HROB visit- contineu weekly testing, then twice weekly @ 32wks, growth every 4wk.   Future Appointments  Date Time Provider Department Center  01/15/2024  3:00 PM [facility] - [facility] US  [facility] None  01/15/2024  3:50 PM Loreli Suzen BIRCH, CNM [facility]  01/22/2024  1:30 PM [facility] US   [facility] None  01/22/2024  2:30 PM Marilynn Nest, DO [facility]  01/28/2024  2:15 PM [facility] US  [facility] None  01/28/2024  2:30 PM Marilynn Nest, DO [facility]  02/03/2024  1:30 PM [facility] US  [facility] None  02/03/2024  2:30 PM Marilynn Nest, DO [facility]  02/06/2024  3:30 PM [facility]  02/10/2024  2:15 PM [facility]  [facility] None  02/10/2024  3:10 PM Jayne Vonn DEL, MD [facility]  02/13/2024  3:30 PM [facility]  02/17/2024  2:15 PM [facility] 2 [facility] None  02/17/2024  3:10 PM Kizzie Suzen SAUNDERS, CNM [facility]  02/20/2024 11:10 AM [facility]  02/24/2024  2:15 PM [facility] 2 [facility] None  02/24/2024  3:10 PM Jayne Vonn DEL, MD [facility]  03/02/2024  3:00 PM [facility] 2 [facility] None  03/02/2024  3:50 PM Kizzie Suzen SAUNDERS, CNM [facility]  03/09/2024  3:00 PM [facility] US  [facility] None  03/12/2024  3:30 PM [facility]  03/16/2024  3:00 PM [facility] 2 [facility] None  03/19/2024  3:30 PM [facility]    No orders of the defined types were placed in this encounter.   Dee Paden, DO Attending Obstetrician & Gynecologist, [facility] for [employer], [facility]

## 2024-01-15 ENCOUNTER — Other Ambulatory Visit

## 2024-01-15 ENCOUNTER — Encounter: Admitting: Advanced Practice Midwife

## 2024-01-16 ENCOUNTER — Other Ambulatory Visit

## 2024-01-22 ENCOUNTER — Ambulatory Visit (INDEPENDENT_AMBULATORY_CARE_PROVIDER_SITE_OTHER)

## 2024-01-22 ENCOUNTER — Ambulatory Visit: Admitting: Obstetrics & Gynecology

## 2024-01-22 VITALS — BP 95/65 | HR 93 | Wt 105.0 lb

## 2024-01-22 DIAGNOSIS — Z3A26 26 weeks gestation of pregnancy: Secondary | ICD-10-CM | POA: Diagnosis not present

## 2024-01-22 DIAGNOSIS — O365930 Maternal care for other known or suspected poor fetal growth, third trimester, not applicable or unspecified: Secondary | ICD-10-CM | POA: Diagnosis not present

## 2024-01-22 DIAGNOSIS — O0993 Supervision of high risk pregnancy, unspecified, third trimester: Secondary | ICD-10-CM | POA: Diagnosis not present

## 2024-01-22 DIAGNOSIS — Z3A31 31 weeks gestation of pregnancy: Secondary | ICD-10-CM | POA: Diagnosis not present

## 2024-01-22 DIAGNOSIS — O365920 Maternal care for other known or suspected poor fetal growth, second trimester, not applicable or unspecified: Secondary | ICD-10-CM | POA: Diagnosis not present

## 2024-01-22 NOTE — Progress Notes (Signed)
 US  31+4 wks,frank breech,FHR 152 bpm,posterior placenta gr 0,AFI 14 cm,BPP 8/8,EFW 1249 g .6 %,RI .75,.74,.65=87%

## 2024-01-22 NOTE — Progress Notes (Signed)
 HIGH-RISK PREGNANCY VISIT Patient name: Mary Singleton MRN [id number]  Date of birth: December 30, 1995 Chief Complaint:   Routine Prenatal Visit  History of Present Illness:   Mary Singleton is a 28 y.o. G71P0102 female at [redacted]w[redacted]d with an Estimated Date of Delivery: 03/21/24 being seen today for ongoing management of a high-risk pregnancy complicated by:  -FGR -Prolapse- pessary in place -HSV asymptomatic  Today she reports occasional contractions.   Contractions: Not present. Vag. Bleeding: None.  Movement: Present. denies leaking of fluid.      12/04/2023    3:14 PM 09/11/2023   11:13 AM 12/28/2022    9:24 AM 10/11/2020    2:54 PM 01/09/2017    2:56 PM  Depression screen PHQ 2/9  Decreased Interest 1 2 0 0 0  Down, Depressed, Hopeless 1 0 0 0 0  PHQ - 2 Score 2 2 0 0 0  Altered sleeping 0 2  0   Tired, decreased energy 2 2  0   Change in appetite 0 0  0   Feeling bad or failure about yourself  0 0  0   Trouble concentrating 0 0  1   Moving slowly or fidgety/restless 0 0  0   Suicidal thoughts 0 0  0   PHQ-9 Score 4  6   1     Difficult doing work/chores Not difficult at all         Data saved with a previous flowsheet row definition     Current Outpatient Medications  Medication Instructions   Blood Pressure Monitor MISC For regular home bp monitoring during pregnancy   docusate sodium  (COLACE) 100 mg, 2 times daily   Prenat-FeCbn-FeAspGl-FA-Omega (OB COMPLETE PETITE) 35-5-1-200 MG CAPS 1 tablet, Oral, Daily, BIN:  [id number]   PCN:  CN     GRP:  [id number]    ID:  [id number]   valACYclovir  (VALTREX ) 500 mg, Oral, 2 times daily     Review of Systems:   Pertinent items are noted in HPI Denies abnormal vaginal discharge w/ itching/odor/irritation, headaches, visual changes, shortness of breath, chest pain, abdominal pain, severe nausea/vomiting, or problems with urination or bowel movements unless otherwise stated above. Pertinent History Reviewed:  Reviewed past  medical,surgical, social, obstetrical and family history.  Reviewed problem list, medications and allergies. Physical Assessment:   Vitals:   01/22/24 1409  BP: 95/65  Pulse: 93  Weight: 105 lb (47.6 kg)  Body mass index is 21.21 kg/m.           Physical Examination:   General appearance: alert, well appearing, and in no distress  Mental status: normal mood, behavior, speech, dress, motor activity, and thought processes  Skin: warm & dry   Extremities:      Cardiovascular: normal heart rate noted  Respiratory: normal respiratory effort, no distress  Abdomen: gravid, soft, non-tender  Pelvic: Cervical exam deferred         Fetal Status:     Movement: Present    Fetal Surveillance Testing today: frank breech,FHR 152 bpm,posterior placenta gr 0,AFI 14 cm,BPP 8/8,EFW 1249 g .6 %,RI .75,.74,.65=87%    Chaperone: N/A    No results found for this or any previous visit (from the past 24 hours).   Assessment & Plan:  High-risk pregnancy: G2P0102 at [redacted]w[redacted]d with an Estimated Date of Delivery: 03/21/24   1) FGR -normal antepartum testing today -twice weekly testing starting next week Plan for IOL per [facility] guidelines  2) uterine prolapse - Pessary still  working well  Meds: No orders of the defined types were placed in this encounter.   Labs/procedures today: BPP Doppler  Treatment Plan: Routine OB care and as outlined above  Reviewed: Preterm labor symptoms and general obstetric precautions including but not limited to vaginal bleeding, contractions, leaking of fluid and fetal movement were reviewed in detail with the patient.  All questions were answered.  Follow-up: Return for Twice weekly visit starting next week..   Future Appointments  Date Time Provider Department Center  01/22/2024  2:30 PM Marilynn Nest, DO [facility]  01/28/2024  2:15 PM [facility] US  [facility] None  01/28/2024  2:30 PM Marilynn Nest, DO [facility]  02/03/2024  1:30 PM [facility] US   [facility] None  02/03/2024  2:30 PM Marilynn Nest, DO [facility]  02/06/2024  3:30 PM [facility] NURSE [facility]  02/10/2024  2:15 PM [facility] US  [facility] None  02/10/2024  3:10 PM Jayne Vonn DEL, MD [facility]  02/13/2024  3:30 PM [facility] NURSE [facility]  02/17/2024  2:15 PM [facility] IMG 2 [facility] None  02/17/2024  3:10 PM Kizzie Suzen SAUNDERS, CNM [facility]  02/20/2024 11:10 AM [facility] NURSE [facility]  02/24/2024  2:15 PM [facility] IMG 2 [facility] None  02/24/2024  3:10 PM Jayne Vonn DEL, MD [facility]  03/02/2024  3:00 PM [facility] US  [facility] None  03/02/2024  3:50 PM Kizzie Suzen SAUNDERS, CNM [facility]  03/09/2024  3:00 PM [facility] US  [facility] None  03/09/2024  4:10 PM Jayne Vonn DEL, MD [facility]  03/12/2024  3:30 PM [facility] NURSE [facility]  03/16/2024  3:00 PM [facility] IMG 2 [facility] None  03/16/2024  3:50 PM Jayne Vonn DEL, MD [facility]  03/19/2024  3:30 PM [facility] NURSE [facility]    No orders of the defined types were placed in this encounter.   Jowanna Loeffler, DO Attending Obstetrician & Gynecologist, [facility] for [employer], [facility]

## 2024-01-28 ENCOUNTER — Encounter: Payer: Self-pay | Admitting: Obstetrics & Gynecology

## 2024-01-28 ENCOUNTER — Ambulatory Visit: Admitting: Obstetrics & Gynecology

## 2024-01-28 ENCOUNTER — Ambulatory Visit (INDEPENDENT_AMBULATORY_CARE_PROVIDER_SITE_OTHER)

## 2024-01-28 VITALS — BP 95/64 | HR 102 | Wt 107.0 lb

## 2024-01-28 DIAGNOSIS — Z3A32 32 weeks gestation of pregnancy: Secondary | ICD-10-CM

## 2024-01-28 DIAGNOSIS — O321XX0 Maternal care for breech presentation, not applicable or unspecified: Secondary | ICD-10-CM | POA: Diagnosis not present

## 2024-01-28 DIAGNOSIS — O365930 Maternal care for other known or suspected poor fetal growth, third trimester, not applicable or unspecified: Secondary | ICD-10-CM

## 2024-01-28 DIAGNOSIS — O98513 Other viral diseases complicating pregnancy, third trimester: Secondary | ICD-10-CM | POA: Diagnosis not present

## 2024-01-28 DIAGNOSIS — B009 Herpesviral infection, unspecified: Secondary | ICD-10-CM

## 2024-01-28 DIAGNOSIS — O0993 Supervision of high risk pregnancy, unspecified, third trimester: Secondary | ICD-10-CM

## 2024-01-28 DIAGNOSIS — O365920 Maternal care for other known or suspected poor fetal growth, second trimester, not applicable or unspecified: Secondary | ICD-10-CM

## 2024-01-28 DIAGNOSIS — O34523 Maternal care for prolapse of gravid uterus, third trimester: Secondary | ICD-10-CM

## 2024-01-28 DIAGNOSIS — Z4689 Encounter for fitting and adjustment of other specified devices: Secondary | ICD-10-CM

## 2024-01-28 NOTE — Progress Notes (Signed)
 HIGH-RISK PREGNANCY VISIT Patient name: Mary Singleton MRN [id number]  Date of birth: 1995/12/15 Chief Complaint:   Routine Prenatal Visit  History of Present Illness:   Mary Singleton is a 28 y.o. G16P0102 female at [redacted]w[redacted]d with an Estimated Date of Delivery: 03/21/24 being seen today for ongoing management of a high-risk pregnancy complicated by:  -FGR -Prolapse- pessary in place -HSV asymptomatic  Today she reports no complaints.   Contractions: Not present. Vag. Bleeding: None.  Movement: Present. denies leaking of fluid.      12/04/2023    3:14 PM 09/11/2023   11:13 AM 12/28/2022    9:24 AM 10/11/2020    2:54 PM 01/09/2017    2:56 PM  Depression screen PHQ 2/9  Decreased Interest 1 2 0 0 0  Down, Depressed, Hopeless 1 0 0 0 0  PHQ - 2 Score 2 2 0 0 0  Altered sleeping 0 2  0   Tired, decreased energy 2 2  0   Change in appetite 0 0  0   Feeling bad or failure about yourself  0 0  0   Trouble concentrating 0 0  1   Moving slowly or fidgety/restless 0 0  0   Suicidal thoughts 0 0  0   PHQ-9 Score 4  6   1     Difficult doing work/chores Not difficult at all         Data saved with a previous flowsheet row definition     Current Outpatient Medications  Medication Instructions   Blood Pressure Monitor MISC For regular home bp monitoring during pregnancy   docusate sodium  (COLACE) 100 mg, 2 times daily   Prenat-FeCbn-FeAspGl-FA-Omega (OB COMPLETE PETITE) 35-5-1-200 MG CAPS 1 tablet, Oral, Daily, BIN:  [id number]   PCN:  CN     GRP:  [id number]    ID:  [id number]   valACYclovir  (VALTREX ) 500 mg, Oral, 2 times daily     Review of Systems:   Pertinent items are noted in HPI Denies abnormal vaginal discharge w/ itching/odor/irritation, headaches, visual changes, shortness of breath, chest pain, abdominal pain, severe nausea/vomiting, or problems with urination or bowel movements unless otherwise stated above. Pertinent History Reviewed:  Reviewed past medical,surgical,  social, obstetrical and family history.  Reviewed problem list, medications and allergies. Physical Assessment:   Vitals:   01/28/24 1459  BP: 95/64  Pulse: (!) 102  Weight: 107 lb (48.5 kg)  Body mass index is 21.61 kg/m.           Physical Examination:   General appearance: alert, well appearing, and in no distress  Mental status: normal mood, behavior, speech, dress, motor activity, and thought processes  Skin: warm & dry   Extremities:   no edema   Cardiovascular: normal heart rate noted  Respiratory: normal respiratory effort, no distress  Abdomen: gravid, soft, non-tender  Pelvic: Cervical exam performed  pessary removed cleaned and replaced without issues       Fetal Status:     Movement: Present    Fetal Surveillance Testing today:frank breech,posterior placenta gr 0,BPP 8/8,AFI 10 cm,FHR 124 bpm,LVEICF,RI .75,.68,.71=90%   Chaperone: Alan Fischer    No results found for this or any previous visit (from the past 24 hours).   Assessment & Plan:  High-risk pregnancy: G2P0102 at [redacted]w[redacted]d with an Estimated Date of Delivery: 03/21/24   1. Poor fetal growth affecting management of mother in third trimester, single or unspecified fetus -reviewed US  findings -BPP 8/8, dopplers 90% -  continue antepartum testing -IOL pending fetal status, current plan 37wk  2. Encounter for supervision of high risk pregnancy in third trimester, antepartum (Primary) -declined RSV vaccine  3. Pessary maintenance Cleaned today- no issues  4. 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   Labs/procedures today: BPP/dopplers  Treatment Plan:  as outlined above  Reviewed: Preterm labor symptoms and general obstetric precautions including but not limited to vaginal bleeding, contractions, leaking of fluid and fetal movement were reviewed in detail with the patient.  All questions were answered.  Follow-up: Return for Twice weekly appt as  scheduled.   Future Appointments  Date Time Provider Department Center  02/03/2024  1:30 PM [facility] - [facility]YN US  [facility] None  02/03/2024  2:30 PM Marilynn Nest, DO [facility]  02/06/2024  3:30 PM [facility] NURSE [facility]  02/10/2024  2:15 PM [facility] US  [facility] None  02/10/2024  3:10 PM Jayne Vonn DEL, MD [facility]  02/13/2024  3:30 PM [facility] NURSE [facility]  02/17/2024  2:15 PM [facility] FT IMG 2 [facility] None  02/17/2024  3:10 PM Kizzie Suzen SAUNDERS, CNM [facility]  02/20/2024 11:10 AM [facility] NURSE [facility]  02/24/2024  2:15 PM [facility] FT IMG 2 [facility] None  02/24/2024  3:10 PM Jayne Vonn DEL, MD [facility]  03/02/2024  3:00 PM [facility] US  [facility] None  03/02/2024  3:50 PM Kizzie Suzen SAUNDERS, CNM [facility]  03/09/2024  3:00 PM [facility] - [facility] US  [facility] None  03/09/2024  4:10 PM Jayne Vonn DEL, MD [facility]  03/12/2024  3:30 PM [facility] NURSE [facility]  03/16/2024  3:00 PM [facility] FT IMG 2 [facility] None  03/16/2024  3:50 PM Jayne Vonn DEL, MD [facility]  03/19/2024  3:30 PM [facility] NURSE [facility]    No orders of the defined types were placed in this encounter.   Kingslee Mairena, DO Attending Obstetrician & Gynecologist, [facility] for [employer], [facility]

## 2024-01-28 NOTE — Progress Notes (Signed)
 US  32+3 wks,frank breech,posterior placenta gr 0,BPP 8/8,AFI 10 cm,FHR 124 bpm,LVEICF,RI .75,.68,.71=90%

## 2024-02-02 ENCOUNTER — Inpatient Hospital Stay (HOSPITAL_COMMUNITY)
Admission: AD | Admit: 2024-02-02 | Discharge: 2024-02-02 | Disposition: A | Discharge status: Home / Self Care | Attending: Obstetrics and Gynecology | Admitting: Obstetrics and Gynecology

## 2024-02-02 ENCOUNTER — Encounter (HOSPITAL_COMMUNITY): Payer: Self-pay | Admitting: Obstetrics and Gynecology

## 2024-02-02 DIAGNOSIS — O23599 Infection of other part of genital tract in pregnancy, unspecified trimester: Secondary | ICD-10-CM

## 2024-02-02 DIAGNOSIS — Z3689 Encounter for other specified antenatal screening: Secondary | ICD-10-CM | POA: Diagnosis not present

## 2024-02-02 DIAGNOSIS — O09293 Supervision of pregnancy with other poor reproductive or obstetric history, third trimester: Secondary | ICD-10-CM | POA: Diagnosis not present

## 2024-02-02 DIAGNOSIS — O368130 Decreased fetal movements, third trimester, not applicable or unspecified: Secondary | ICD-10-CM | POA: Insufficient documentation

## 2024-02-02 DIAGNOSIS — B3731 Acute candidiasis of vulva and vagina: Secondary | ICD-10-CM | POA: Insufficient documentation

## 2024-02-02 DIAGNOSIS — O4693 Antepartum hemorrhage, unspecified, third trimester: Secondary | ICD-10-CM | POA: Diagnosis present

## 2024-02-02 DIAGNOSIS — Z3A33 33 weeks gestation of pregnancy: Secondary | ICD-10-CM | POA: Insufficient documentation

## 2024-02-02 DIAGNOSIS — O34523 Maternal care for prolapse of gravid uterus, third trimester: Secondary | ICD-10-CM | POA: Diagnosis not present

## 2024-02-02 DIAGNOSIS — O98813 Other maternal infectious and parasitic diseases complicating pregnancy, third trimester: Secondary | ICD-10-CM | POA: Diagnosis not present

## 2024-02-02 DIAGNOSIS — O365930 Maternal care for other known or suspected poor fetal growth, third trimester, not applicable or unspecified: Secondary | ICD-10-CM | POA: Insufficient documentation

## 2024-02-02 DIAGNOSIS — R109 Unspecified abdominal pain: Secondary | ICD-10-CM | POA: Diagnosis present

## 2024-02-02 DIAGNOSIS — O4703 False labor before 37 completed weeks of gestation, third trimester: Secondary | ICD-10-CM | POA: Insufficient documentation

## 2024-02-02 DIAGNOSIS — Z3493 Encounter for supervision of normal pregnancy, unspecified, third trimester: Secondary | ICD-10-CM

## 2024-02-02 DIAGNOSIS — Z113 Encounter for screening for infections with a predominantly sexual mode of transmission: Secondary | ICD-10-CM | POA: Diagnosis present

## 2024-02-02 HISTORY — DX: Incomplete uterovaginal prolapse: N81.2

## 2024-02-02 LAB — WET PREP, GENITAL
Clue Cells Wet Prep HPF POC: NONE SEEN
Sperm: NONE SEEN
Trich, Wet Prep: NONE SEEN
WBC, Wet Prep HPF POC: >=10 — AB (ref <10)

## 2024-02-02 LAB — URINALYSIS, ROUTINE W REFLEX MICROSCOPIC
Bilirubin Urine: NEGATIVE
Glucose, UA: NEGATIVE mg/dL
Hgb urine dipstick: NEGATIVE
Ketones, ur: NEGATIVE mg/dL
Nitrite: NEGATIVE
Protein, ur: NEGATIVE mg/dL
Specific Gravity, Urine: 1.015 (ref 1.005–1.030)
pH: 6.5 (ref 5.0–8.0)

## 2024-02-02 LAB — URINALYSIS, MICROSCOPIC (REFLEX): Bacteria, UA: NONE SEEN

## 2024-02-02 MED ORDER — FLUCONAZOLE 150 MG PO TABS: 150.0000 mg | ORAL_TABLET | ORAL | 0 refills | Status: AC

## 2024-02-02 NOTE — Discharge Instructions (Addendum)
 Please remember to bring your pessary to your appointment at [facility] tomorrow!  YEAST INFECTION (CANDIDIASIS): Your culture was positive for vulvovaginal candidiasis (yeast infection). This is not a sexually transmitted infection, however abstaining from sex or using condoms may prevent recurrence. It is caused by a change in the acid level in the vagina, which is very common during pregnancy.   I have sent a prescription for Diflucan  to your pharmacy. Take the first pill once you pick it up. Take the second pill after 72 hours. Keep the area cool and dry as much as possible. Use gentle nonscented soap when bathing. Coconut oil has some antifungal properties that may offer some relief. Avoid foods that are high in sugar. Eat more yogurt or have a probiotic. Wear natural, breathable fibers such as cotton, silk or linen. Wear loose fitting clothing.   Reasons to return to MAU at [facility]: Less than 36 weeks: Contractions feels like menstrual cramps. You should go to the hospital if you have more than 6 contractions in an hour, even after you have rested and drank at least 16 ounces of water.  More than 36 weeks: You begin to have strong, frequent contractions 5 minutes apart or less, each last 1 minute, these have been going on for 1-2 hours, and you cannot walk or talk during them. Your water breaks.  Sometimes it is a big gush of fluid. However, many times it may it may be much more subtle. You should go to the hospital if you have a constant leakage of fluid from your vagina, enough to soak a pad when you are walking around.  You have vaginal bleeding.  It is normal to have a small amount of spotting if your cervix was checked. If you have bleeding requiring the use of a pad, go to the hospital. You don't feel your baby moving like normal.  If you think that you baby's movement is decreased, eat a snack and rest on your left side in a quiet room for one hour. If  you have not felt the baby move more than 6 times in an hour GO TO THE HOSPITAL.

## 2024-02-02 NOTE — MAU Provider Note (Cosign Needed Addendum)
 Chief Complaint:  Abdominal Pain and Vaginal Bleeding   HPI    Mary Singleton is a 28 y.o. G2P0102 at [redacted]w[redacted]d who presents to maternity admissions reporting an episode of vaginal bleeding. States that around 5p, she had an episode of scant vaginal bleeding. Currently has a pessary in place and feels like it's still in place. Denies intercourse, vaginal discharge or itching. Also describes lower abdominal tightening every 5 minutes, but not very painful. Feels like the baby has moved less in the last couple hours prior to presenting to the MAU.  Pregnancy Course: Receives prenatal care at [facility]. Pregnancy is complicated by cervical prolapse with pessary in place and FGR. Last ultrasound completed 11/19 EFW 1249g 0.6%ile.   Past Medical History:  Diagnosis Date   Medical history non-contributory    Prolapse  cervix    Pessary in place   OB History  Gravida Para Term Preterm AB Living  2 1 0 1 0 2  SAB IAB Ectopic Multiple Live Births  0 0 0 2 2    # Outcome Date GA Lbr Len/2nd Weight Sex Type Anes PTL Lv  2 Current           1A Preterm 03/09/21 [redacted]w[redacted]d / 00:39 1890 g F Vag-Spont EPI  LIV  1B Preterm 03/10/21 [redacted]w[redacted]d / 01:12 1990 g M Vag-Spont EPI  LIV   Past Surgical History:  Procedure Laterality Date   BIOPSY  08/16/2017   Procedure: BIOPSY;  Surgeon: Golda Claudis PENNER, MD;  [facility];  Service: Endoscopy;;  duodenum   ESOPHAGOGASTRODUODENOSCOPY (EGD) WITH PROPOFOL  N/A 08/16/2017   Procedure: ESOPHAGOGASTRODUODENOSCOPY (EGD) WITH PROPOFOL ;  Surgeon: Golda Claudis PENNER, MD;  Location: [facility];  Service: Endoscopy;  Laterality: N/A;  8:30   EYE SURGERY     Family History  Problem Relation Age of Onset   Cancer Maternal Grandmother    Healthy Mother    Social History   Tobacco Use   Smoking status: Never   Smokeless tobacco: Never  Vaping Use   Vaping status: Never Used  Substance Use Topics   Alcohol use: No   Drug use: No   Allergies  Allergen  Reactions   Pineapple Itching and Swelling   Meloxicam  Rash   Medications Prior to Admission  Medication Sig Dispense Refill Last Dose/Taking   Prenat-FeCbn-FeAspGl-FA-Omega (OB COMPLETE PETITE) 35-5-1-200 MG CAPS Take 1 tablet by mouth daily. BIN:  [id number]   PCN:  CN     GRP:  [id number]    ID:  [id number] 30 capsule 11 02/02/2024   Blood Pressure Monitor MISC For regular home bp monitoring during pregnancy (Patient not taking: No sig reported) 1 each 0 Unknown   docusate sodium  (COLACE) 100 MG capsule Take 100 mg by mouth 2 (two) times daily. (Patient not taking: No sig reported)   Unknown   valACYclovir  (VALTREX ) 500 MG tablet Take 1 tablet (500 mg total) by mouth 2 (two) times daily. (Patient not taking: No sig reported) 60 tablet 6 Unknown    I have reviewed patient's Past Medical Hx, Surgical Hx, Family Hx, Social Hx, medications and allergies.   ROS  Pertinent items noted in HPI and remainder of comprehensive ROS otherwise negative.   PHYSICAL EXAM  Patient Vitals for the past 24 hrs:  BP Temp Temp src Pulse Resp SpO2 Height Weight  02/02/24 1917 107/70 -- -- 92 -- -- -- --  02/02/24 1900 105/69 98.3 F (36.8 C) Oral (!) 105 18 100 %  4' 11 (1.499 m) 48.8 kg    Constitutional: Well-developed, well-nourished female in no acute distress.  Cardiovascular: Warm and well-perfused Respiratory: normal effort, no problems with respiration noted GI: Gravid MS: Extremities nontender, no edema, normal ROM Neurologic: Alert and oriented x 4.  GU: no CVA tenderness Pelvic: SSE showing pessary in place, removed pessary, no blood noted on pessary or in vaginal canal. Thin, white discharge present Dilation: Fingertip Effacement (%): Thick Cervical Position: Anterior Station: Ballotable Exam by:: Jomarie MD  Fetal Tracing: Baseline: 150 bpm Variability: Moderate Accelerations: Present Decelerations: Absent  Toco: Flat   Labs: Results for orders placed or performed during the  hospital encounter of 02/02/24 (from the past 24 hours)  Urinalysis, Routine w reflex microscopic -Urine, Clean Catch     Status: Abnormal   Collection Time: 02/02/24  7:12 PM  Result Value Ref Range   Color, Urine YELLOW YELLOW   APPearance CLEAR CLEAR   Specific Gravity, Urine 1.015 1.005 - 1.030   pH 6.5 5.0 - 8.0   Glucose, UA NEGATIVE NEGATIVE mg/dL   Hgb urine dipstick NEGATIVE NEGATIVE   Bilirubin Urine NEGATIVE NEGATIVE   Ketones, ur NEGATIVE NEGATIVE mg/dL   Protein, ur NEGATIVE NEGATIVE mg/dL   Nitrite NEGATIVE NEGATIVE   Leukocytes,Ua TRACE (A) NEGATIVE  Urinalysis, Microscopic (reflex)     Status: None   Collection Time: 02/02/24  7:12 PM  Result Value Ref Range   RBC / HPF 0-5 0 - 5 RBC/hpf   WBC, UA 0-5 0 - 5 WBC/hpf   Bacteria, UA NONE SEEN NONE SEEN   Squamous Epithelial / HPF 0-5 0 - 5 /HPF   Mucus PRESENT   Wet prep, genital     Status: Abnormal   Collection Time: 02/02/24  8:31 PM  Result Value Ref Range   Yeast Wet Prep HPF POC PRESENT (A) NONE SEEN   Trich, Wet Prep NONE SEEN NONE SEEN   Clue Cells Wet Prep HPF POC NONE SEEN NONE SEEN   WBC, Wet Prep HPF POC >=10 (A) <10   Sperm NONE SEEN     Imaging:  No results found.  MDM & MAU COURSE  MDM: Moderate  MAU Course: Orders Placed This Encounter  Procedures   Wet prep, genital   Urinalysis, Routine w reflex microscopic -Urine, Clean Catch   Urinalysis, Microscopic (reflex)   Discharge patient   Meds ordered this encounter  Medications   fluconazole  (DIFLUCAN ) 150 MG tablet    Sig: Take 1 tablet (150 mg total) by mouth every 3 (three) days for 6 days.    Dispense:  2 tablet    Refill:  0   VSS. No vaginal bleeding noted on exam. Removed pessary, SVE FT/thick/ballotable. Vaginal swabs collected. Patient advised to hydrate although no contractions measuring on toco even with adjustments. Pessary placed in bag for patient to take to [facility] appointment 12/1 for re-placement. Wet prep pending.  Patient  signed out to Joesph Sear at 9pm  Charlie DELENA Jomarie, MD Family Medicine - Obstetrics Fellow  Care assumed at 2100, wet prep pending at that time. Wet prep positive for yeast infection, will treat with PO Diflucan  150 mg x2 every 72 hours given patient use of pessary and low pain tolerance for any vaginal insertion.  ASSESSMENT   1. [redacted] weeks gestation of pregnancy   2. False labor before 37 completed weeks of gestation in third trimester   3. Movement of fetus present during pregnancy in third trimester   4.  NST (non-stress test) reactive   5. Candidal vulvovaginitis during pregnancy     PLAN  Discharge from MAU in stable condition with preterm labor precautions. PO Diflucan  150 mg x2 72 hours apart for vulvovaginal candidiasis. Opting for PO medication rather than intravaginal terconazole  cream due to pessary use and pain with vaginal insertion. Follow up at [facility] tomorrow as scheduled. Bring pessary to that appointment.    Follow-up Information     [facility] for [facility] Follow up.   Specialty: Obstetrics and Gynecology Why: As scheduled for ongoing prenatal care Contact information: [address] [place]  [address] [phone]                Allergies as of 02/02/2024       Reactions   Pineapple Itching, Swelling   Meloxicam  Rash        Medication List     TAKE these medications    Blood Pressure Monitor Misc For regular home bp monitoring during pregnancy   docusate sodium  100 MG capsule Commonly known as: COLACE Take 100 mg by mouth 2 (two) times daily.   fluconazole  150 MG tablet Commonly known as: Diflucan  Take 1 tablet (150 mg total) by mouth every 3 (three) days for 6 days.   OB Complete Petite 35-5-1-200 MG Caps Take 1 tablet by mouth daily. BIN:  [id number]   PCN:  CN     GRP:  [id number]    ID:  [id number]   valACYclovir  500 MG tablet Commonly known as: Valtrex  Take 1  tablet (500 mg total) by mouth 2 (two) times daily.        Joesph DELENA Sear, PA

## 2024-02-02 NOTE — MAU Note (Addendum)
 MAU Triage Note: Mary Singleton is a 28 y.o. at [redacted]w[redacted]d here in MAU reporting: light vaginal bleeding that first began 30 minutes ago along with abdominal cramping that is described as feeling like period cramps. Denies recent IC. Does report pessary in place. Reports DFM in the last 1-2 hours. Denies abnormal discharge or watery LOF.   Patient complaint: DFM, bleeding, ctx  Pain Score: 6  Pain Location: Abdomen     Onset of complaint: today LMP: Patient's last menstrual period was 06/15/2023 (approximate).  Vitals:   02/02/24 1900  BP: 105/69  Pulse: (!) 105  Resp: 18  Temp: 98.3 F (36.8 C)  SpO2: 100%    FHT:  Fetal Heart Rate Mode: External Baseline Rate (A): 152 bpm Multiple birth?: No Lab orders placed from triage: UA

## 2024-02-03 ENCOUNTER — Encounter: Payer: Self-pay | Admitting: Obstetrics & Gynecology

## 2024-02-03 ENCOUNTER — Ambulatory Visit: Admitting: Obstetrics & Gynecology

## 2024-02-03 ENCOUNTER — Ambulatory Visit

## 2024-02-03 VITALS — BP 105/73 | HR 93 | Wt 105.6 lb

## 2024-02-03 DIAGNOSIS — O365920 Maternal care for other known or suspected poor fetal growth, second trimester, not applicable or unspecified: Secondary | ICD-10-CM

## 2024-02-03 DIAGNOSIS — O0993 Supervision of high risk pregnancy, unspecified, third trimester: Secondary | ICD-10-CM

## 2024-02-03 DIAGNOSIS — O365930 Maternal care for other known or suspected poor fetal growth, third trimester, not applicable or unspecified: Secondary | ICD-10-CM

## 2024-02-03 DIAGNOSIS — Z3A33 33 weeks gestation of pregnancy: Secondary | ICD-10-CM

## 2024-02-03 LAB — GC/CHLAMYDIA PROBE AMP (~~LOC~~) NOT AT ARMC
Chlamydia: NEGATIVE
Comment: NEGATIVE
Comment: NORMAL
Neisseria Gonorrhea: NEGATIVE

## 2024-02-03 NOTE — Progress Notes (Signed)
 US  33+2 wks,frank breech,FHR 176 bpm,posterior placenta gr 2,BPP 8/8,AFI 9 cm,RI .62,.59,.60=52% (limited dopplers because of continuous breathing)

## 2024-02-03 NOTE — Progress Notes (Signed)
 HIGH-RISK PREGNANCY VISIT Patient name: Mary Singleton MRN [id number]  Date of birth: Jan 02, 1996 Chief Complaint:   Routine Prenatal Visit  History of Present Illness:   Mary Singleton is a 28 y.o. G52P0102 female at [redacted]w[redacted]d with an Estimated Date of Delivery: 03/21/24 being seen today for ongoing management of a high-risk pregnancy complicated by:  -FGR -previously had pessary in place due to cervical prolapse, removed yesterday in MAU due to spotting and yeast infection.  Today she reports that she actually feels better with pessary out.  Reports no further vaginal bleeding  Contractions: Not present. Vag. Bleeding: None.  Movement: Present. denies leaking of fluid.      12/04/2023    3:14 PM 09/11/2023   11:13 AM 12/28/2022    9:24 AM 10/11/2020    2:54 PM 01/09/2017    2:56 PM  Depression screen PHQ 2/9  Decreased Interest 1 2 0 0 0  Down, Depressed, Hopeless 1 0 0 0 0  PHQ - 2 Score 2 2 0 0 0  Altered sleeping 0 2  0   Tired, decreased energy 2 2  0   Change in appetite 0 0  0   Feeling bad or failure about yourself  0 0  0   Trouble concentrating 0 0  1   Moving slowly or fidgety/restless 0 0  0   Suicidal thoughts 0 0  0   PHQ-9 Score 4  6   1     Difficult doing work/chores Not difficult at all         Data saved with a previous flowsheet row definition     Current Outpatient Medications  Medication Instructions   Blood Pressure Monitor MISC For regular home bp monitoring during pregnancy   docusate sodium  (COLACE) 100 mg, 2 times daily   fluconazole  (DIFLUCAN ) 150 mg, Oral, every 72 hours   Prenat-FeCbn-FeAspGl-FA-Omega (OB COMPLETE PETITE) 35-5-1-200 MG CAPS 1 tablet, Oral, Daily, BIN:  [id number]   PCN:  CN     GRP:  [id number]    ID:  [id number]   valACYclovir  (VALTREX ) 500 mg, Oral, 2 times daily     Review of Systems:   Pertinent items are noted in HPI Denies headaches, visual changes, shortness of breath, chest pain, abdominal pain, severe nausea/vomiting, or  problems with urination or bowel movements unless otherwise stated above. Pertinent History Reviewed:  Reviewed past medical,surgical, social, obstetrical and family history.  Reviewed problem list, medications and allergies. Physical Assessment:   Vitals:   02/03/24 1426  BP: 105/73  Pulse: 93  Weight: 105 lb 9.6 oz (47.9 kg)  Body mass index is 21.33 kg/m.           Physical Examination:   General appearance: alert, well appearing, and in no distress  Mental status: normal mood, behavior, speech, dress, motor activity, and thought processes  Skin: warm & dry   Extremities:   no edema   Cardiovascular: normal heart rate noted  Respiratory: normal respiratory effort, no distress  Abdomen: gravid, soft, non-tender  Pelvic: Cervical exam deferred         Fetal Status:     Movement: Present    Fetal Surveillance Testing today: frank breech,FHR 176 bpm,posterior placenta gr 2,BPP 8/8,AFI 9 cm,RI .62,.59,.60=52% (limited dopplers because of continuous breathing)    Chaperone: N/A    Results for orders placed or performed during the hospital encounter of 02/02/24 (from the past 24 hours)  Urinalysis, Routine w reflex microscopic -Urine, Clean  Catch   Collection Time: 02/02/24  7:12 PM  Result Value Ref Range   Color, Urine YELLOW YELLOW   APPearance CLEAR CLEAR   Specific Gravity, Urine 1.015 1.005 - 1.030   pH 6.5 5.0 - 8.0   Glucose, UA NEGATIVE NEGATIVE mg/dL   Hgb urine dipstick NEGATIVE NEGATIVE   Bilirubin Urine NEGATIVE NEGATIVE   Ketones, ur NEGATIVE NEGATIVE mg/dL   Protein, ur NEGATIVE NEGATIVE mg/dL   Nitrite NEGATIVE NEGATIVE   Leukocytes,Ua TRACE (A) NEGATIVE  Urinalysis, Microscopic (reflex)   Collection Time: 02/02/24  7:12 PM  Result Value Ref Range   RBC / HPF 0-5 0 - 5 RBC/hpf   WBC, UA 0-5 0 - 5 WBC/hpf   Bacteria, UA NONE SEEN NONE SEEN   Squamous Epithelial / HPF 0-5 0 - 5 /HPF   Mucus PRESENT   Wet prep, genital   Collection Time: 02/02/24  8:31  PM  Result Value Ref Range   Yeast Wet Prep HPF POC PRESENT (A) NONE SEEN   Trich, Wet Prep NONE SEEN NONE SEEN   Clue Cells Wet Prep HPF POC NONE SEEN NONE SEEN   WBC, Wet Prep HPF POC >=10 (A) <10   Sperm NONE SEEN   GC/Chlamydia probe amp ([place])not at [facility]   Collection Time: 02/02/24  8:31 PM  Result Value Ref Range   Neisseria Gonorrhea Negative    Chlamydia Negative    Comment Normal Reference Ranger Chlamydia - Negative    Comment      Normal Reference Range Neisseria Gonorrhea - Negative     Assessment & Plan:  High-risk pregnancy: G2P0102 at [redacted]w[redacted]d with an Estimated Date of Delivery: 03/21/24   1. Breech presentation - Briefly discussed ECV - Will continue to monitor fetal position   2. Poor fetal growth affecting management of mother in third trimester, single or unspecified fetus -normal testing today BPP 8/8, normal dopplers -plan for IOL ~ 37wk due to severe FGR   3. Encounter for supervision of high risk pregnancy in third trimester, antepartum (Primary) -routine OB care   Meds: No orders of the defined types were placed in this encounter.   Labs/procedures today: BPP/dopplers  Treatment Plan: Routine OB care and as outlined above  Reviewed: Preterm labor symptoms and general obstetric precautions including but not limited to vaginal bleeding, contractions, leaking of fluid and fetal movement were reviewed in detail with the patient.  All questions were answered.   Follow-up: Return for twice weekly as scheduled.   Future Appointments  Date Time Provider Department Center  02/06/2024  3:30 PM [facility] NURSE [facility]  02/10/2024  2:15 PM [facility] - [facility]TO[facility]YN US  [facility] None  02/10/2024  3:10 PM Jayne Vonn DEL, MD [facility]  02/13/2024  3:30 PM [facility] NURSE [facility]  02/17/2024  2:15 PM [facility] - FT IMG 2 [facility] None  02/17/2024  3:10 PM Kizzie Suzen SAUNDERS, CNM [facility]  02/20/2024 11:10 AM [facility] NURSE [facility]   02/24/2024  2:15 PM [facility] - FT IMG 2 [facility] None  02/24/2024  3:10 PM Jayne Vonn DEL, MD [facility]  03/02/2024  3:00 PM [facility] - FTOBGYN US  [facility] None  03/02/2024  3:50 PM Kizzie Suzen SAUNDERS, CNM [facility]  03/09/2024  3:00 PM [facility] - FTO[facility]YN US  [facility] None  03/09/2024  4:10 PM Jayne Vonn DEL, MD [facility]  03/12/2024  3:30 PM [facility] NURSE [facility]  03/16/2024  3:00 PM [facility] - FT IMG 2 [facility] None  03/16/2024  3:50 PM Jayne Vonn DEL, MD [facility]  03/19/2024  3:30 PM [facility] NURSE [facility]    No orders of the defined types were placed in this encounter.   Avacyn Kloosterman, DO Attending Obstetrician & Gynecologist, [facility] for [employer], [facility]

## 2024-02-04 ENCOUNTER — Encounter: Admitting: Obstetrics & Gynecology

## 2024-02-04 ENCOUNTER — Other Ambulatory Visit

## 2024-02-06 ENCOUNTER — Ambulatory Visit: Admitting: *Deleted

## 2024-02-06 VITALS — BP 97/67 | HR 101

## 2024-02-06 DIAGNOSIS — O0993 Supervision of high risk pregnancy, unspecified, third trimester: Secondary | ICD-10-CM

## 2024-02-06 DIAGNOSIS — N812 Incomplete uterovaginal prolapse: Secondary | ICD-10-CM

## 2024-02-06 DIAGNOSIS — O365920 Maternal care for other known or suspected poor fetal growth, second trimester, not applicable or unspecified: Secondary | ICD-10-CM

## 2024-02-06 DIAGNOSIS — Z3A33 33 weeks gestation of pregnancy: Secondary | ICD-10-CM

## 2024-02-06 NOTE — Progress Notes (Cosign Needed Addendum)
   NURSE VISIT- NST  SUBJECTIVE:  Mary Singleton is a 28 y.o. G3P0102 female at [redacted]w[redacted]d, here for a NST for pregnancy complicated by FGR.  She reports active fetal movement, contractions: none, vaginal bleeding: none, membranes: intact.   OBJECTIVE:  BP 97/67   Pulse (!) 101   LMP 06/15/2023 (Approximate)   Appears well, no apparent distress  No results found for this or any previous visit (from the past 24 hours).  NST: FHR baseline 155 bpm, Variability: moderate, Accelerations:present, Decelerations:  Absent= Cat 1/reactive Toco: none   ASSESSMENT: G2P0102 at [redacted]w[redacted]d with FGR NST reactive  PLAN: EFM strip reviewed by Dr. Ozan   Recommendations: keep next appointment as scheduled    Mary Singleton  02/06/2024 4:08 PM

## 2024-02-10 ENCOUNTER — Ambulatory Visit: Admitting: Women's Health

## 2024-02-10 ENCOUNTER — Other Ambulatory Visit

## 2024-02-10 VITALS — BP 105/69 | HR 87 | Wt 106.0 lb

## 2024-02-10 DIAGNOSIS — O0993 Supervision of high risk pregnancy, unspecified, third trimester: Secondary | ICD-10-CM

## 2024-02-10 DIAGNOSIS — Z3A34 34 weeks gestation of pregnancy: Secondary | ICD-10-CM

## 2024-02-10 DIAGNOSIS — O365930 Maternal care for other known or suspected poor fetal growth, third trimester, not applicable or unspecified: Secondary | ICD-10-CM

## 2024-02-10 DIAGNOSIS — O365920 Maternal care for other known or suspected poor fetal growth, second trimester, not applicable or unspecified: Secondary | ICD-10-CM

## 2024-02-10 DIAGNOSIS — N812 Incomplete uterovaginal prolapse: Secondary | ICD-10-CM

## 2024-02-10 NOTE — Progress Notes (Signed)
 HIGH-RISK PREGNANCY VISIT Patient name: Mary Singleton MRN [id number]  Date of birth: [birth year] Chief Complaint:   Routine Prenatal Visit  History of Present Illness:   Mary Singleton is a 28 y.o. G23P0102 female at [redacted]w[redacted]d with an Estimated Date of Delivery: 03/21/24 being seen today for ongoing management of a high-risk pregnancy complicated by severe FGR 0.4% w/ UAD 91%, cervical prolapse.    Today she reports thinks lost some of mucous plug. Feels better w/ pessary out. Cx came out but went back in on its own. . Contractions: Not present. Vag. Bleeding: None.  Movement: Present. denies leaking of fluid.      12/04/2023    3:14 PM 09/11/2023   11:13 AM 12/28/2022    9:24 AM 10/11/2020    2:54 PM 01/09/2017    2:56 PM  Depression screen PHQ 2/9  Decreased Interest 1 2 0 0 0  Down, Depressed, Hopeless 1 0 0 0 0  PHQ - 2 Score 2 2 0 0 0  Altered sleeping 0 2  0   Tired, decreased energy 2 2  0   Change in appetite 0 0  0   Feeling bad or failure about yourself  0 0  0   Trouble concentrating 0 0  1   Moving slowly or fidgety/restless 0 0  0   Suicidal thoughts 0 0  0   PHQ-9 Score 4  6   1     Difficult doing work/chores Not difficult at all         Data saved with a previous flowsheet row definition        12/04/2023    3:15 PM 09/11/2023   11:13 AM 10/11/2020    2:54 PM  GAD 7 : Generalized Anxiety Score  Nervous, Anxious, on Edge 1 2 1   Control/stop worrying 1 0 0  Worry too much - different things 1 0 0  Trouble relaxing 0 0 0  Restless 0 0 0  Easily annoyed or irritable 1 0 1  Afraid - awful might happen 1 0 0  Total GAD 7 Score 5 2 2   Anxiety Difficulty Not difficult at all       Review of Systems:   Pertinent items are noted in HPI Denies abnormal vaginal discharge w/ itching/odor/irritation, headaches, visual changes, shortness of breath, chest pain, abdominal pain, severe nausea/vomiting, or problems with urination or bowel movements unless otherwise stated  above. Pertinent History Reviewed:  Reviewed past medical,surgical, social, obstetrical and family history.  Reviewed problem list, medications and allergies. Physical Assessment:   Vitals:   02/10/24 1353  BP: 105/69  Pulse: 87  Weight: 106 lb (48.1 kg)  Body mass index is 21.41 kg/m.           Physical Examination:   General appearance: alert, well appearing, and in no distress  Mental status: alert, oriented to person, place, and time  Skin: warm & dry   Extremities:      Cardiovascular: normal heart rate noted  Respiratory: normal respiratory effort, no distress  Abdomen: gravid, soft, non-tender  Pelvic: Cervical exam deferred         Fetal Status:     Movement: Present    Fetal Surveillance Testing today: US  34+2 wks,frank breech,posterior placenta gr 2,AFI 12.5 cm,BPP 8/8,RI .72,.67,.71=91%,EFW 1571 g .04%   Chaperone: N/A  No results found for this or any previous visit (from the past 24 hours).  Assessment & Plan:  High-risk pregnancy: G2P0102 at  [redacted]w[redacted]d with an Estimated Date of Delivery: 03/21/24   1) Severe FGR, 0.4%, UAD 91%, bpp 8/8, plan IOL 37w unless indicated earlier  2) Cx prolapse, pessary removed 11/30, doing well overall  Meds: No orders of the defined types were placed in this encounter.   Labs/procedures today: U/S  Treatment Plan:   UAD EFW Testing Delivery  EFW/AC <3% Q 1wk Q 3wk 28w-weekly BPP 32w-add NST 37.0    Reviewed: Preterm labor symptoms and general obstetric precautions including but not limited to vaginal bleeding, contractions, leaking of fluid and fetal movement were reviewed in detail with the patient.  All questions were answered. Does have home bp cuff. Office bp cuff given: not applicable. Check bp weekly, let us  know if consistently >140 and/or >90.  Follow-up: Return for As scheduled.   Future Appointments  Date Time Provider Department Center  02/10/2024  2:15 PM [facility] - FTOBGYN US  [facility] None  02/10/2024  3:10 PM  Kizzie Suzen SAUNDERS, CNM [facility]  02/13/2024  2:10 PM [facility] NURSE [facility]  02/17/2024  2:15 PM [facility] - FT IMG 2 [facility] None  02/17/2024  3:10 PM Kizzie Suzen SAUNDERS, CNM [facility] FTO[facility]YN  02/20/2024 11:10 AM [facility] NURSE [facility]  02/24/2024  2:15 PM [facility] - FT IMG 2 [facility] None  02/24/2024  3:10 PM Jayne Vonn DEL, MD [facility] FTO[facility]YN  03/02/2024  3:00 PM [facility] - FTOBGYN US  [facility] None  03/02/2024  3:50 PM Kizzie Suzen SAUNDERS, CNM [facility]TO[facility]  03/09/2024  3:00 PM [facility] - FTOBGYN US  [facility] None  03/09/2024  4:10 PM Jayne Vonn DEL, MD [facility] FTO[facility]YN  03/12/2024  3:30 PM [facility] NURSE [facility]  03/16/2024  3:00 PM [facility] - FT IMG 2 [facility] None  03/16/2024  3:50 PM Jayne Vonn DEL, MD [facility]TO[facility]YN  03/19/2024  3:30 PM [facility] NURSE [facility]    No orders of the defined types were placed in this encounter.  Suzen SAUNDERS Kizzie CNM, [facility] 02/10/2024 2:04 PM

## 2024-02-10 NOTE — Patient Instructions (Signed)
 Mary Singleton, thank you for choosing our office today! We appreciate the opportunity to meet your healthcare needs. You may receive a short survey by mail, e-mail, or through [employer]. If you are happy with your care we would appreciate if you could take just a few minutes to complete the survey questions. We read all of your comments and take your feedback very seriously. Thank you again for choosing our office.  Center for [employer] Team at [facility]  [facility] at [facility] ([address] [place], [place] [address]) Entrance C, located off of [address] Free 24/7 valet parking   CLASSES: Go to [email or web address] to register for classes (childbirth, breastfeeding, waterbirth, infant CPR, daddy bootcamp, etc.)  Call the office [phone]) or go to [facility] if: You begin to have strong, frequent contractions Your water breaks.  Sometimes it is a big gush of fluid, sometimes it is just a trickle that keeps getting your panties wet or running down your legs You have vaginal bleeding.  It is normal to have a small amount of spotting if your cervix was checked.  You don't feel your baby moving like normal.  If you don't, get you something to eat and drink and lay down and focus on feeling your baby move.   If your baby is still not moving like normal, you should call the office or go to [facility].  Call the office [phone]) or go to [facility] hospital for these signs of pre-eclampsia: Severe headache that does not go away with Tylenol  Visual changes- seeing spots, double, blurred vision Pain under your right breast or upper abdomen that does not go away with Tums or heartburn medicine Nausea and/or vomiting Severe swelling in your hands, feet, and face   Tdap Vaccine It is recommended that you get the Tdap vaccine during the third trimester of EACH pregnancy to help protect your baby from getting pertussis (whooping cough) 27-36 weeks is the BEST time to do  this so that you can pass the protection on to your baby. During pregnancy is better than after pregnancy, but if you are unable to get it during pregnancy it will be offered at the hospital.  You can get this vaccine with us , at the health department, your family doctor, or some local pharmacies Everyone who will be around your baby should also be up-to-date on their vaccines before the baby comes. Adults (who are not pregnant) only need 1 dose of Tdap during adulthood.   [facility] Pediatricians/Family Doctors [facility]): [address] Luba BROCKS, [phone]           [facility]: [address], [phone]                [facility]): [address], [phone] (call to ask if accepting patients) [facility] Department: [address], [place], [phone]    [facility] Pediatricians/Family Doctors [facility]): [phone] [address], [phone] [facility]: [address] [place], [phone] [facility]: [address], [phone]  [facility] Doctors  [facility]): [phone] [facility]: [address] [phone]   [facility] Doctors [facility]: [address] [phone]  [facility] Doctors  [facility]: [phone], [phone]  Home Blood Pressure Monitoring for Patients   Your provider has recommended that you check your  blood pressure (BP) at least once a week at home. If you do not have a blood pressure cuff at home, one will be provided for you. Contact your provider if you have not received your monitor within 1 week.   Helpful Tips for Accurate Home Blood Pressure Checks  Don't smoke, exercise, or drink caffeine 30 minutes before checking your BP Use the restroom before checking your BP (a full bladder can raise your  pressure) Relax in a comfortable upright chair Feet on the ground Left arm resting comfortably on a flat surface at the level of your heart Legs uncrossed Back supported Sit quietly and don't talk Place the cuff on your bare arm Adjust snuggly, so that only two fingertips can fit between your skin and the top of the cuff Check 2 readings separated by at least one minute Keep a log of your BP readings For a visual, please reference this diagram: http://ccnc.care/bpdiagram  Provider Name: [facility]     Phone: [phone]  Zone 1: ALL CLEAR  Continue to monitor your symptoms:  BP reading is less than 140 (top number) or less than 90 (bottom number)  No right upper stomach pain No headaches or seeing spots No feeling nauseated or throwing up No swelling in face and hands  Zone 2: CAUTION Call your doctor's office for any of the following:  BP reading is greater than 140 (top number) or greater than 90 (bottom number)  Stomach pain under your ribs in the middle or right side Headaches or seeing spots Feeling nauseated or throwing up Swelling in face and hands  Zone 3: EMERGENCY  Seek immediate medical care if you have any of the following:  BP reading is greater than160 (top number) or greater than 110 (bottom number) Severe headaches not improving with Tylenol  Serious difficulty catching your breath Any worsening symptoms from Zone 2  Preterm Labor and Birth Information  The normal length of a pregnancy is 39-41 weeks. Preterm labor is when labor starts before 37 completed weeks of pregnancy. What are the risk factors for preterm labor? Preterm labor is more likely to occur in women who: Have certain infections during pregnancy such as a bladder infection, sexually transmitted infection, or infection inside the uterus (chorioamnionitis). Have a shorter-than-normal cervix. Have gone into preterm labor before. Have had surgery on their cervix. Are younger than age 47  or older than age 82. Are African American. Are pregnant with twins or multiple babies (multiple gestation). Take street drugs or smoke while pregnant. Do not gain enough weight while pregnant. Became pregnant shortly after having been pregnant. What are the symptoms of preterm labor? Symptoms of preterm labor include: Cramps similar to those that can happen during a menstrual period. The cramps may happen with diarrhea. Pain in the abdomen or lower back. Regular uterine contractions that may feel like tightening of the abdomen. A feeling of increased pressure in the pelvis. Increased watery or bloody mucus discharge from the vagina. Water breaking (ruptured amniotic sac). Why is it important to recognize signs of preterm labor? It is important to recognize signs of preterm labor because babies who are born prematurely may not be fully developed. This can put them at an increased risk for: Long-term (chronic) heart and lung problems. Difficulty immediately after birth with regulating body systems, including blood sugar, body temperature, heart rate, and breathing rate. Bleeding in the brain. Cerebral palsy. Learning difficulties. Death. These risks are highest for babies who are born before 34 weeks  of pregnancy. How is preterm labor treated? Treatment depends on the length of your pregnancy, your condition, and the health of your baby. It may involve: Having a stitch (suture) placed in your cervix to prevent your cervix from opening too early (cerclage). Taking or being given medicines, such as: Hormone medicines. These may be given early in pregnancy to help support the pregnancy. Medicine to stop contractions. Medicines to help mature the baby's lungs. These may be prescribed if the risk of delivery is high. Medicines to prevent your baby from developing cerebral palsy. If the labor happens before 34 weeks of pregnancy, you may need to stay in the hospital. What should I do if I  think I am in preterm labor? If you think that you are going into preterm labor, call your health care provider right away. How can I prevent preterm labor in future pregnancies? To increase your chance of having a full-term pregnancy: Do not use any tobacco products, such as cigarettes, chewing tobacco, and e-cigarettes. If you need help quitting, ask your health care provider. Do not use street drugs or medicines that have not been prescribed to you during your pregnancy. Talk with your health care provider before taking any herbal supplements, even if you have been taking them regularly. Make sure you gain a healthy amount of weight during your pregnancy. Watch for infection. If you think that you might have an infection, get it checked right away. Make sure to tell your health care provider if you have gone into preterm labor before. This information is not intended to replace advice given to you by your health care provider. Make sure you discuss any questions you have with your health care provider. Document Revised: 06/13/2018 Document Reviewed: 07/13/2015 Elsevier Patient Education  2020 [employer].

## 2024-02-10 NOTE — Progress Notes (Signed)
 US  34+2 wks,frank breech,posterior placenta gr 2,AFI 12.5 cm,BPP 8/8,RI .72,.67,.71=91%,EFW 1571 g .04%

## 2024-02-12 ENCOUNTER — Other Ambulatory Visit: Payer: Self-pay | Admitting: Advanced Practice Midwife

## 2024-02-12 DIAGNOSIS — O0992 Supervision of high risk pregnancy, unspecified, second trimester: Secondary | ICD-10-CM

## 2024-02-12 DIAGNOSIS — O0993 Supervision of high risk pregnancy, unspecified, third trimester: Secondary | ICD-10-CM

## 2024-02-12 DIAGNOSIS — Z3A35 35 weeks gestation of pregnancy: Secondary | ICD-10-CM

## 2024-02-12 DIAGNOSIS — O365990 Maternal care for other known or suspected poor fetal growth, unspecified trimester, not applicable or unspecified: Secondary | ICD-10-CM

## 2024-02-12 DIAGNOSIS — Z3A25 25 weeks gestation of pregnancy: Secondary | ICD-10-CM

## 2024-02-12 DIAGNOSIS — B9689 Other specified bacterial agents as the cause of diseases classified elsewhere: Secondary | ICD-10-CM

## 2024-02-12 DIAGNOSIS — O365920 Maternal care for other known or suspected poor fetal growth, second trimester, not applicable or unspecified: Secondary | ICD-10-CM

## 2024-02-13 ENCOUNTER — Ambulatory Visit

## 2024-02-13 VITALS — BP 100/68 | HR 84

## 2024-02-13 DIAGNOSIS — O365930 Maternal care for other known or suspected poor fetal growth, third trimester, not applicable or unspecified: Secondary | ICD-10-CM

## 2024-02-13 DIAGNOSIS — O0993 Supervision of high risk pregnancy, unspecified, third trimester: Secondary | ICD-10-CM

## 2024-02-13 DIAGNOSIS — Z3A34 34 weeks gestation of pregnancy: Secondary | ICD-10-CM

## 2024-02-13 NOTE — Progress Notes (Signed)
° °  NURSE VISIT- NST  SUBJECTIVE:  Mary Singleton is a 28 y.o. G75P0102 female at [redacted]w[redacted]d, here for a NST for pregnancy complicated by FGR.  She reports active fetal movement, contractions: none, vaginal bleeding: none, membranes: intact.   OBJECTIVE:  BP 100/68   Pulse 84   LMP 06/15/2023 (Approximate)   Appears well, no apparent distress  No results found for this or any previous visit (from the past 24 hours).  NST: FHR baseline 145 bpm, Variability: moderate, Accelerations:present, Decelerations:  Absent= Cat 1/reactive Toco: none   ASSESSMENT: G2P0102 at [redacted]w[redacted]d with FGR NST reactive  PLAN: EFM strip reviewed by Dr. Ozan   Recommendations: keep next appointment as scheduled    Aleck FORBES Blase  02/13/2024 2:09 PM

## 2024-02-17 ENCOUNTER — Ambulatory Visit: Admitting: Women's Health

## 2024-02-17 ENCOUNTER — Encounter (HOSPITAL_COMMUNITY): Payer: Self-pay | Admitting: *Deleted

## 2024-02-17 ENCOUNTER — Telehealth (HOSPITAL_COMMUNITY): Payer: Self-pay | Admitting: *Deleted

## 2024-02-17 ENCOUNTER — Ambulatory Visit (INDEPENDENT_AMBULATORY_CARE_PROVIDER_SITE_OTHER): Admitting: Radiology

## 2024-02-17 VITALS — BP 91/66 | HR 80 | Wt 109.0 lb

## 2024-02-17 DIAGNOSIS — O365990 Maternal care for other known or suspected poor fetal growth, unspecified trimester, not applicable or unspecified: Secondary | ICD-10-CM

## 2024-02-17 DIAGNOSIS — O365930 Maternal care for other known or suspected poor fetal growth, third trimester, not applicable or unspecified: Secondary | ICD-10-CM

## 2024-02-17 DIAGNOSIS — Z3A35 35 weeks gestation of pregnancy: Secondary | ICD-10-CM

## 2024-02-17 DIAGNOSIS — O0993 Supervision of high risk pregnancy, unspecified, third trimester: Secondary | ICD-10-CM

## 2024-02-17 DIAGNOSIS — B009 Herpesviral infection, unspecified: Secondary | ICD-10-CM

## 2024-02-17 MED ORDER — ACYCLOVIR 400 MG PO TABS: 400.0000 mg | ORAL_TABLET | Freq: Three times a day (TID) | ORAL | 3 refills | Status: AC

## 2024-02-17 NOTE — Progress Notes (Signed)
 HIGH-RISK PREGNANCY VISIT Patient name: Mary Singleton MRN [id number]  Date of birth: April 27, 1995 Chief Complaint:   Routine Prenatal Visit  History of Present Illness:   Mary Singleton is a 27 y.o. G47P0102 female at [redacted]w[redacted]d with an Estimated Date of Delivery: 03/21/24 being seen today for ongoing management of a high-risk pregnancy complicated by fetal growth restriction 0.4% (AC 0.1%) w/ normal UAD, cervical prolapse.    Today she reports no complaints.Hasn't been taking valtrex . NO recent outbreaks.  Contractions: Irritability. Vag. Bleeding: None.  Movement: Present. denies leaking of fluid.      12/04/2023    3:14 PM 09/11/2023   11:13 AM 12/28/2022    9:24 AM 10/11/2020    2:54 PM 01/09/2017    2:56 PM  Depression screen PHQ 2/9  Decreased Interest 1 2 0 0 0  Down, Depressed, Hopeless 1 0 0 0 0  PHQ - 2 Score 2 2 0 0 0  Altered sleeping 0 2  0   Tired, decreased energy 2 2  0   Change in appetite 0 0  0   Feeling bad or failure about yourself  0 0  0   Trouble concentrating 0 0  1   Moving slowly or fidgety/restless 0 0  0   Suicidal thoughts 0 0  0   PHQ-9 Score 4  6   1     Difficult doing work/chores Not difficult at all         Data saved with a previous flowsheet row definition        12/04/2023    3:15 PM 09/11/2023   11:13 AM 10/11/2020    2:54 PM  GAD 7 : Generalized Anxiety Score  Nervous, Anxious, on Edge 1 2 1   Control/stop worrying 1 0 0  Worry too much - different things 1 0 0  Trouble relaxing 0 0 0  Restless 0 0 0  Easily annoyed or irritable 1 0 1  Afraid - awful might happen 1 0 0  Total GAD 7 Score 5 2 2   Anxiety Difficulty Not difficult at all       Review of Systems:   Pertinent items are noted in HPI Denies abnormal vaginal discharge w/ itching/odor/irritation, headaches, visual changes, shortness of breath, chest pain, abdominal pain, severe nausea/vomiting, or problems with urination or bowel movements unless otherwise stated above. Pertinent  History Reviewed:  Reviewed past medical,surgical, social, obstetrical and family history.  Reviewed problem list, medications and allergies. Physical Assessment:   Vitals:   02/17/24 1453  BP: 91/66  Pulse: 80  Weight: 109 lb (49.4 kg)  Body mass index is 22.02 kg/m.           Physical Examination:   General appearance: alert, well appearing, and in no distress  Mental status: alert, oriented to person, place, and time  Skin: warm & dry   Extremities:      Cardiovascular: normal heart rate noted  Respiratory: normal respiratory effort, no distress  Abdomen: gravid, soft, non-tender  Pelvic: Cervical exam deferred, declines GBS today, wants to do next visit        Fetal Status:     Movement: Present    Fetal Surveillance Testing today: US : GA = 35+2 weeks Single active female fetus, cephalic, FHR = 158 bpm, posterior pl, high, gr2, AFI = 7.4 cm, MVP = 3.1 cm, Amn Fluid vol low normal range, BPP = 8/8, RI: 0.65, 0.57  avg: 0.61 65%, ltd view cx, nl  R ov, L ov not seen  Chaperone: N/A  No results found for this or any previous visit (from the past 24 hours).  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    Dispense:  90 tablet    Refill:  3    Labs/procedures today: U/S  Treatment Plan:    UAD EFW Testing Delivery  EFW/AC <3% Q 1wk Q 3wk 28w-weekly BPP 32w-add NST 37.0    Reviewed: Preterm labor symptoms and general obstetric precautions including but not limited to vaginal bleeding, contractions,  leaking of fluid and fetal movement were reviewed in detail with the patient.  All questions were answered. Does have home bp cuff. Office bp cuff given: not applicable. Check bp weekly, let us  know if consistently >140 and/or >90.  Follow-up: Return for As scheduled.   Future Appointments  Date Time Provider Department Center  02/20/2024 11:10 AM [facility] NURSE [facility]  02/24/2024  2:15 PM [facility] IMG 2 [facility]IMG None  02/24/2024  3:10 PM Jayne Vonn DEL, MD [facility]  02/29/2024 12:00 AM [facility]-LD SCHED ROOM [facility]INDC None    No orders of the defined types were placed in this encounter.  Suzen JONELLE Fetters CNM, [facility] 02/17/2024 4:22 PM

## 2024-02-17 NOTE — Patient Instructions (Signed)
 Your induction is scheduled for 12/27. You should be there on 12/26 at 11:45pm  Go to the main desk at the [facility] and let them know you are there to be induced. They will send someone from Labor & Delivery to come get you.  You will get a call from a nurse from the hospital within the next day or so to go over some information. If you have any questions, please let us  know.    Mary Singleton, thank you for choosing our office today! We appreciate the opportunity to meet your healthcare needs. You may receive a short survey by mail, e-mail, or through [employer]. If you are happy with your care we would appreciate if you could take just a few minutes to complete the survey questions. We read all of your comments and take your feedback very seriously. Thank you again for choosing our office.  Center for [employer] Team at [facility]  [facility] at [facility] ([address] [place], [place] [address]) Entrance C, located off of [address] Free 24/7 valet parking   CLASSES: Go to [email or web address] to register for classes (childbirth, breastfeeding, waterbirth, infant CPR, daddy bootcamp, etc.)  Call the office [phone]) or go to [facility] if: You begin to have strong, frequent contractions Your water breaks.  Sometimes it is a big gush of fluid, sometimes it is just a trickle that keeps getting your panties wet or running down your legs You have vaginal bleeding.  It is normal to have a small amount of spotting if your cervix was checked.  You don't feel your baby moving like normal.  If you don't, get you something to eat and drink and lay down and focus on feeling your baby move.   If your baby is still not moving like normal, you should call the office or go to [facility].  Call the office [phone]) or go to [facility] for these signs of pre-eclampsia: Severe headache that does not go away with Tylenol  Visual changes- seeing spots,  double, blurred vision Pain under your right breast or upper abdomen that does not go away with Tums or heartburn medicine Nausea and/or vomiting Severe swelling in your hands, feet, and face   Tdap Vaccine It is recommended that you get the Tdap vaccine during the third trimester of EACH pregnancy to help protect your baby from getting pertussis (whooping cough) 27-36 weeks is the BEST time to do this so that you can pass the protection on to your baby. During pregnancy is better than after pregnancy, but if you are unable to get it during pregnancy it will be offered at the hospital.  You can get this vaccine with us , at the health department, your family doctor, or some local pharmacies Everyone who will be around your baby should also be up-to-date on their vaccines before the baby comes. Adults (who are not pregnant) only need 1 dose of Tdap during adulthood.   [facility] Pediatricians/Family Doctors [facility]): [address] Luba BROCKS, [phone]           [facility]: [address], [phone]                [facility]): [address], [phone] (call to ask if accepting patients) [facility]: [address], [place], [phone]    [facility] Pediatricians/Family Doctors [facility]): [phone] [address], [phone] [facility]: [address] [place], [phone] [facility]: [address], [phone]  [facility] Doctors  [facility]): [phone] [facility]: [address] [phone]   [facility] Doctors [facility]: [address], [phone]  [facility] Doctors  [facility]: [phone], [phone]  Home Blood Pressure Monitoring for Patients   Your provider has recommended that you check your blood  pressure (BP) at least once a week at home. If you do not have a blood pressure cuff at home, one will be provided for you. Contact your provider if you have not received your monitor within 1 week.   Helpful Tips for Accurate Home Blood Pressure Checks  Don't smoke, exercise, or drink caffeine 30 minutes before checking your BP Use the restroom before checking your BP (a full bladder can raise your pressure) Relax in a comfortable upright chair Feet on the ground Left arm resting comfortably on a flat surface at the level of your heart Legs uncrossed Back supported Sit quietly and don't talk Place the cuff on your bare arm Adjust snuggly, so that only two fingertips can fit between your skin and the top of the cuff Check 2 readings separated by at least one minute Keep a log of your BP readings For a visual, please reference this diagram: http://ccnc.care/bpdiagram  Provider Name: [facility]     Phone: [phone]  Zone 1: ALL CLEAR  Continue to monitor your symptoms:  BP reading is less than 140 (top number) or less than 90 (bottom number)  No right upper stomach pain No headaches or seeing spots No feeling nauseated or throwing up No swelling in face and hands  Zone 2: CAUTION Call your doctor's office for any of the following:  BP reading is greater than 140 (top number) or greater than 90 (bottom number)  Stomach pain under your ribs in the middle or right side Headaches or seeing spots Feeling nauseated or throwing up Swelling in face and hands  Zone 3: EMERGENCY  Seek immediate medical care if you have any of the following:  BP reading is greater than160 (top number) or greater than 110 (bottom number) Severe headaches not improving with Tylenol  Serious difficulty catching your breath Any worsening symptoms from Zone 2  Preterm Labor and Birth Information  The normal length of a pregnancy is 39-41 weeks. Preterm labor is when labor starts before 37 completed  weeks of pregnancy. What are the risk factors for preterm labor? Preterm labor is more likely to occur in women who: Have certain infections during pregnancy such as a bladder infection, sexually transmitted infection, or infection inside the uterus (chorioamnionitis). Have a shorter-than-normal cervix. Have gone into preterm labor before. Have had surgery on their cervix. Are younger than age 44 or older than age 39. Are African American. Are pregnant with twins or multiple babies (multiple gestation). Take street drugs or smoke while pregnant. Do not gain enough weight while pregnant. Became pregnant shortly after having been pregnant. What are the symptoms of preterm labor? Symptoms of preterm labor include: Cramps similar to those that can happen during a menstrual period. The cramps may happen with diarrhea. Pain in the abdomen or lower back. Regular uterine contractions that may feel like tightening of the abdomen. A feeling of increased pressure in the pelvis. Increased watery or bloody mucus discharge from the vagina. Water breaking (ruptured amniotic sac). Why  is it important to recognize signs of preterm labor? It is important to recognize signs of preterm labor because babies who are born prematurely may not be fully developed. This can put them at an increased risk for: Long-term (chronic) heart and lung problems. Difficulty immediately after birth with regulating body systems, including blood sugar, body temperature, heart rate, and breathing rate. Bleeding in the brain. Cerebral palsy. Learning difficulties. Death. These risks are highest for babies who are born before 34 weeks of pregnancy. How is preterm labor treated? Treatment depends on the length of your pregnancy, your condition, and the health of your baby. It may involve: Having a stitch (suture) placed in your cervix to prevent your cervix from opening too early (cerclage). Taking or being given medicines,  such as: Hormone medicines. These may be given early in pregnancy to help support the pregnancy. Medicine to stop contractions. Medicines to help mature the babys lungs. These may be prescribed if the risk of delivery is high. Medicines to prevent your baby from developing cerebral palsy. If the labor happens before 34 weeks of pregnancy, you may need to stay in the hospital. What should I do if I think I am in preterm labor? If you think that you are going into preterm labor, call your health care provider right away. How can I prevent preterm labor in future pregnancies? To increase your chance of having a full-term pregnancy: Do not use any tobacco products, such as cigarettes, chewing tobacco, and e-cigarettes. If you need help quitting, ask your health care provider. Do not use street drugs or medicines that have not been prescribed to you during your pregnancy. Talk with your health care provider before taking any herbal supplements, even if you have been taking them regularly. Make sure you gain a healthy amount of weight during your pregnancy. Watch for infection. If you think that you might have an infection, get it checked right away. Make sure to tell your health care provider if you have gone into preterm labor before. This information is not intended to replace advice given to you by your health care provider. Make sure you discuss any questions you have with your health care provider. Document Revised: 06/13/2018 Document Reviewed: 07/13/2015 Elsevier Patient Education  2020 [employer].

## 2024-02-17 NOTE — Telephone Encounter (Signed)
 Preadmission screen

## 2024-02-17 NOTE — Progress Notes (Signed)
 US : GA = 35+2 weeks Single active female fetus, cephalic, FHR = 158 bpm, posterior pl, high, gr2, AFI = 7.4 cm, MVP = 3.1 cm, BPP = 8/8, RI: 0.65, 0.57  avg: 0.61 65%, ltd view cx, nl R ov, L ov not seen

## 2024-02-18 ENCOUNTER — Other Ambulatory Visit: Payer: Self-pay | Admitting: Advanced Practice Midwife

## 2024-02-18 DIAGNOSIS — O365920 Maternal care for other known or suspected poor fetal growth, second trimester, not applicable or unspecified: Secondary | ICD-10-CM

## 2024-02-18 DIAGNOSIS — O365990 Maternal care for other known or suspected poor fetal growth, unspecified trimester, not applicable or unspecified: Secondary | ICD-10-CM

## 2024-02-18 DIAGNOSIS — Z3A36 36 weeks gestation of pregnancy: Secondary | ICD-10-CM

## 2024-02-18 DIAGNOSIS — O0993 Supervision of high risk pregnancy, unspecified, third trimester: Secondary | ICD-10-CM

## 2024-02-18 DIAGNOSIS — B9689 Other specified bacterial agents as the cause of diseases classified elsewhere: Secondary | ICD-10-CM

## 2024-02-18 DIAGNOSIS — O0992 Supervision of high risk pregnancy, unspecified, second trimester: Secondary | ICD-10-CM

## 2024-02-20 ENCOUNTER — Inpatient Hospital Stay (HOSPITAL_COMMUNITY)

## 2024-02-20 ENCOUNTER — Inpatient Hospital Stay (HOSPITAL_COMMUNITY)
Admission: AD | Admit: 2024-02-20 | Discharge: 2024-02-20 | DRG: 833 | Disposition: A | Discharge status: Other Acute Inpatient Hospital | Attending: Obstetrics & Gynecology | Admitting: Obstetrics & Gynecology

## 2024-02-20 ENCOUNTER — Encounter (HOSPITAL_COMMUNITY): Payer: Self-pay | Admitting: Obstetrics and Gynecology

## 2024-02-20 ENCOUNTER — Ambulatory Visit

## 2024-02-20 VITALS — BP 96/65 | HR 86 | Wt 112.0 lb

## 2024-02-20 DIAGNOSIS — Z743 Need for continuous supervision: Secondary | ICD-10-CM | POA: Diagnosis not present

## 2024-02-20 DIAGNOSIS — Z3A35 35 weeks gestation of pregnancy: Secondary | ICD-10-CM

## 2024-02-20 DIAGNOSIS — O365930 Maternal care for other known or suspected poor fetal growth, third trimester, not applicable or unspecified: Secondary | ICD-10-CM | POA: Diagnosis present

## 2024-02-20 DIAGNOSIS — O365990 Maternal care for other known or suspected poor fetal growth, unspecified trimester, not applicable or unspecified: Secondary | ICD-10-CM | POA: Diagnosis present

## 2024-02-20 DIAGNOSIS — R531 Weakness: Secondary | ICD-10-CM | POA: Diagnosis not present

## 2024-02-20 DIAGNOSIS — O368330 Maternal care for abnormalities of the fetal heart rate or rhythm, third trimester, not applicable or unspecified: Principal | ICD-10-CM

## 2024-02-20 DIAGNOSIS — O368390 Maternal care for abnormalities of the fetal heart rate or rhythm, unspecified trimester, not applicable or unspecified: Principal | ICD-10-CM

## 2024-02-20 DIAGNOSIS — O4290 Premature rupture of membranes, unspecified as to length of time between rupture and onset of labor, unspecified weeks of gestation: Secondary | ICD-10-CM

## 2024-02-20 LAB — TYPE AND SCREEN
ABO/RH(D): B POS
Antibody Screen: NEGATIVE

## 2024-02-20 LAB — CBC
HCT: 29 % — ABNORMAL LOW (ref 36.0–46.0)
Hemoglobin: 9.1 g/dL — ABNORMAL LOW (ref 12.0–15.0)
MCH: 24.7 pg — ABNORMAL LOW (ref 26.0–34.0)
MCHC: 31.4 g/dL (ref 30.0–36.0)
MCV: 78.8 fL — ABNORMAL LOW (ref 80.0–100.0)
Platelets: 193 K/uL (ref 150–400)
RBC: 3.68 MIL/uL — ABNORMAL LOW (ref 3.87–5.11)
RDW: 14.1 % (ref 11.5–15.5)
WBC: 11.7 K/uL — ABNORMAL HIGH (ref 4.0–10.5)
nRBC: 0 % (ref 0.0–0.2)

## 2024-02-20 LAB — RUPTURE OF MEMBRANE (ROM)PLUS: Rom Plus: POSITIVE

## 2024-02-20 LAB — POCT FERN TEST: POCT Fern Test: NEGATIVE

## 2024-02-20 MED ORDER — LACTATED RINGERS IV SOLN: 500.0000 mL | INTRAVENOUS | Status: DC | PRN

## 2024-02-20 MED ORDER — LACTATED RINGERS IV SOLN: INTRAVENOUS | Status: DC

## 2024-02-20 MED ORDER — SODIUM CHLORIDE 0.9 % IV SOLN
5.0000 10*6.[IU] | Freq: Once | INTRAVENOUS | Status: AC
  Administered 2024-02-20: 18:00:00 5 10*6.[IU] via INTRAVENOUS
  Filled 2024-02-20: qty 5

## 2024-02-20 MED ORDER — LACTATED RINGERS IV BOLUS
1000.0000 mL | Freq: Once | INTRAVENOUS | Status: AC
  Administered 2024-02-20: 14:00:00 1000 mL via INTRAVENOUS

## 2024-02-20 MED ORDER — PENICILLIN G POT IN DEXTROSE 60000 UNIT/ML IV SOLN
3.0000 10*6.[IU] | INTRAVENOUS | Status: DC
  Filled 2024-02-20 (×3): qty 50

## 2024-02-20 NOTE — Progress Notes (Signed)
 OB Note  I spoke to Dr. Leonce PILA attending at [facility] and he said that they spoke to their NICU and they have space but no staff for the patient.   I will try [facility]  Mary Izell Raddle MD Attending Center for [employer] (Faculty Practice) 02/20/2024 Time: [phone]

## 2024-02-20 NOTE — MAU Note (Signed)
 [facility] here to transfer patient to [facility]. RN called [facility] to inform them of patient status.

## 2024-02-20 NOTE — MAU Note (Signed)
 Report given to [facility] L&D charge nurse.

## 2024-02-20 NOTE — MAU Provider Note (Cosign Needed)
 History     [id number]  Arrival date and time: 02/20/24 1301    Chief Complaint  Patient presents with   Non-stress Test     HPI Mary Singleton is a 28 y.o. at [redacted]w[redacted]d by LMP c/w 7w US , who presents from the office after failed NST with recurrent variables with contractions. Contractions started around noon yesterday when patient was cleaning up her living room. They have been irregular but painful. Her pregnancy is complicated by FGR <1%ile. She states that the contractions have continued into today. Has scheduled IOL on 12/27. Endorses good FM and irregular contraction. Denies VB or LOF.      B/Positive/-- (07/09 1207)  Past Medical History:  Diagnosis Date   Medical history non-contributory    Prolapse  cervix    Pessary in place    Past Surgical History:  Procedure Laterality Date   BIOPSY  08/16/2017   Procedure: BIOPSY;  Surgeon: Golda Claudis PENNER, MD;  Location: [facility];  Service: Endoscopy;;  duodenum   ESOPHAGOGASTRODUODENOSCOPY (EGD) WITH PROPOFOL  N/A 08/16/2017   Procedure: ESOPHAGOGASTRODUODENOSCOPY (EGD) WITH PROPOFOL ;  Surgeon: Golda Claudis PENNER, MD;  Location: [facility];  Service: Endoscopy;  Laterality: N/A;  8:30   EYE SURGERY      Family History  Problem Relation Age of Onset   Cancer Maternal Grandmother    Healthy Mother     Social History   Socioeconomic History   Marital status: Single    Spouse name: Not on file   Number of children: 0   Years of education: Not on file   Highest education level: 12th grade  Occupational History   Not on file  Tobacco Use   Smoking status: Never   Smokeless tobacco: Never  Vaping Use   Vaping status: Never Used  Substance and Sexual Activity   Alcohol use: No   Drug use: No   Sexual activity: Not Currently    Birth control/protection: None  Other Topics Concern   Not on file  Social History Narrative   Not on file   Social Drivers of Health   Tobacco Use: Low Risk (02/20/2024)    Patient History    Smoking Tobacco Use: Never    Smokeless Tobacco Use: Never    Passive Exposure: Not on file  Financial Resource Strain: Low Risk (10/01/2023)   Overall Financial Resource Strain (CARDIA)    Difficulty of Paying Living Expenses: Not hard at all  Food Insecurity: No Food Insecurity (02/02/2024)   Epic    Worried About Programme Researcher, Broadcasting/film/video in the Last Year: Never true    Ran Out of Food in the Last Year: Never true  Transportation Needs: No Transportation Needs (02/02/2024)   Epic    Lack of Transportation (Medical): No    Lack of Transportation (Non-Medical): No  Physical Activity: Insufficiently Active (10/01/2023)   Exercise Vital Sign    Days of Exercise per Week: 1 day    Minutes of Exercise per Session: 10 min  Stress: Patient Declined (10/01/2023)   [employer] of Occupational Health - Occupational Stress Questionnaire    Feeling of Stress: Patient declined  Social Connections: Moderately Isolated (10/01/2023)   Social Connection and Isolation Panel    Frequency of Communication with Friends and Family: More than three times a week    Frequency of Social Gatherings with Friends and Family: Once a week    Attends Religious Services: 1 to 4 times per year    Active Member  of Clubs or Organizations: No    Attends Banker Meetings: Not on file    Marital Status: Never married  Intimate Partner Violence: Not At Risk (02/02/2024)   Epic    Fear of Current or Ex-Partner: No    Emotionally Abused: No    Physically Abused: No    Sexually Abused: No  Depression (PHQ2-9): Low Risk (12/04/2023)   Depression (PHQ2-9)    PHQ-2 Score: 4  Recent Concern: Depression (PHQ2-9) - Medium Risk (09/11/2023)   Depression (PHQ2-9)    PHQ-2 Score: 6  Alcohol Screen: Low Risk (10/01/2023)   Alcohol Screen    Last Alcohol Screening Score (AUDIT): 0  Housing: Unknown (02/02/2024)   Epic    Unable to Pay for Housing in the Last Year: No    Number of Times Moved in  the Last Year: Not on file    Homeless in the Last Year: No  Utilities: Not At Risk (02/02/2024)   Epic    Threatened with loss of utilities: No  Health Literacy: Not on file    Allergies[1]  Medications Ordered Prior to Encounter[2]  Pertinent positives and negative per HPI, all others reviewed and negative  Physical Exam   BP 107/68 (BP Location: Right Arm)   Pulse 74   Temp 98 F (36.7 C)   Resp 18   Ht 4' 11 (1.499 m)   Wt 50.8 kg   LMP 06/15/2023 (Approximate)   SpO2 100%   BMI 22.62 kg/m   Patient Vitals for the past 24 hrs:  BP Temp Pulse Resp SpO2 Height Weight  02/20/24 1333 107/68 -- 74 -- 100 % -- --  02/20/24 1319 (!) 110/56 98 F (36.7 C) 92 18 -- 4' 11 (1.499 m) 50.8 kg    Physical Exam Vitals and nursing note reviewed.  Constitutional:      Appearance: She is well-developed.  HENT:     Head: Normocephalic and atraumatic.     Mouth/Throat:     Mouth: Mucous membranes are moist.  Eyes:     Extraocular Movements: Extraocular movements intact.  Cardiovascular:     Rate and Rhythm: Normal rate and regular rhythm.  Pulmonary:     Effort: Pulmonary effort is normal.  Abdominal:     Palpations: Abdomen is soft.     Tenderness: There is no abdominal tenderness.  Skin:    Capillary Refill: Capillary refill takes less than 2 seconds.  Neurological:     General: No focal deficit present.     Mental Status: She is alert.     FHT Baseline: 150 bpm Variability: Good {> 6 bpm) Accelerations: Reactive Decelerations: Variable: occasional variable decclerations with contractions Uterine activity: Irregular Frequency: Every 2-10 minutes  Labs No results found for this or any previous visit (from the past 24 hours).  Imaging No results found.  MAU Course  Procedures Lab Orders  No laboratory test(s) ordered today   Meds ordered this encounter  Medications   lactated ringers  bolus 1,000 mL    Imaging Orders         US  MFM UA CORD DOPPLER          US  MFM FETAL BPP WO NON STRESS     MDM Moderate (Level 3-4)  Assessment and Plan  Non-reassuring fetal cardiotocographic tracing  Leakage of amniotic fluid  [redacted] weeks gestation of pregnancy  Indication for care in labor or delivery - Plan: CBC, Type and screen, RPR, CBC, Type and screen, RPR   #  FWB: NST: Reactive with occasional variable decelerations that occur with contractions.   Mary Singleton is a 28 y.o. at [redacted]w[redacted]d by LMP c/w 7w US , who presents from the office after failed NST with recurrent variables with contractions.  Hx significant for IUGR. Last US : 02/17/24 at [redacted]w[redacted]d IUGR 0.4%ile, (AC 0.1%ile) with BPP 8/8 with low normal fluid.   -Continued to have occasional variables with contractions that seemed to mildly improve with fluid bolus but continued throughout 5 hours of monitoring. SVE 0.5/50/-3 which is unchanged from office cervical exam. -Irregular contractions every 2-10 minutes. Patient feels mild discomfort with them but otherwise states they are tolerable. -CBC with anemia, Hgb 9.1 -While patient was in MAU, she started complaining of leakage of fluid. Initial fern negative. SSE with small amount of pooling in the vaginal vault. Repeat fern negative but ROM (+) positive.  -Repeat US  this visit BPP 8/8 with AFI 8.7, MVP 2.6. S/D 2.4 49%ile, RI 0.6, 61%ile.  -Patient discussed with attending Dr. Izell -Spoke with NICU who recommended transfer due to lack of NICU available beds.  -Transfer accepted at [facility] for likely PTL and SROM in the setting of PTL.  -Stable for transfer.     Janila Arrazola L Jewelia Bocchino, MD/MHA 02/20/2024 1:47 PM       [1]  Allergies Allergen Reactions   Pineapple Itching and Swelling   Meloxicam  Rash  [2]  No current facility-administered medications on file prior to encounter.   Current Outpatient Medications on File Prior to Encounter  Medication Sig Dispense Refill   acyclovir  (ZOVIRAX ) 400 MG tablet Take 1 tablet (400 mg total)  by mouth 3 (three) times daily. 90 tablet 3   Blood Pressure Monitor MISC For regular home bp monitoring during pregnancy 1 each 0   docusate sodium  (COLACE) 100 MG capsule Take 100 mg by mouth 2 (two) times daily. (Patient not taking: Reported on 02/17/2024)     Prenat-FeCbn-FeAspGl-FA-Omega (OB COMPLETE PETITE) 35-5-1-200 MG CAPS Take 1 tablet by mouth daily. BIN:  [id number]   PCN:  [id number]     GRP:  [id number]    ID:  [id number] 30 capsule 11   [DISCONTINUED] clonazePAM (KLONOPIN) 0.5 MG tablet

## 2024-02-20 NOTE — Progress Notes (Signed)
 OB Note  [facility] called back and Dr. Annia states they cannot accept the patient  I will try [facility]  Bebe Izell Raddle MD Attending Center for [facility] (Faculty Practice) 02/20/2024 Time: [phone]

## 2024-02-20 NOTE — Progress Notes (Addendum)
° °  NURSE VISIT- NST  SUBJECTIVE:  Mary Singleton is a 28 y.o. G8P0102 female at [redacted]w[redacted]d, here for a NST for pregnancy complicated by FGR.  She reports active fetal movement, contractions: irregular, vaginal bleeding: none, membranes: intact.   OBJECTIVE:  BP 96/65   Pulse 86   Wt 112 lb (50.8 kg)   LMP 06/15/2023 (Approximate)   BMI 22.62 kg/m   Appears well, no apparent distress  No results found for this or any previous visit (from the past 24 hours).  NST: FHR baseline 145 bpm, Variability: moderate, Accelerations:present, Decelerations:  Present variables with contractions= Cat 2/non-reactive Toco: irregular, every 2-3 minutes   ASSESSMENT: G2P0102 at [redacted]w[redacted]d with FGR NST reactive  PLAN: EFM strip reviewed by Luke Fetters, CNM, [facility]   Recommendations: send to [facility] MAU Dr. Ilean notified    Alan LITTIE Fischer  02/20/2024 12:05 PM   Chart reviewed for nurse visit. Agree with plan of care. Contractions began this am. Cat 2 strip, moderate variables w/ uc's q 2-3 first few minutes after put on, variables improved some. SVE ft/50ish/-2, vtx. To MAU for further eval. Advised to go straight there. Notified Dr. Ilean and picture of strip sent.  Fetters Suzen [place], [place] 02/20/2024 12:35 PM

## 2024-02-20 NOTE — MAU Note (Signed)
 Mary Singleton is a 28 y.o. at [redacted]w[redacted]d here in MAU reporting: sent from office for NST. Reports a little cramping. Cervix check 0.5cm. Good fetal movement reported.   LMP:  Onset of complaint: today Pain score: 7  Vitals:   02/20/24 1319  BP: (!) 110/56  Pulse: 92  Resp: 18  Temp: 98 F (36.7 C)     FHT: 168  Lab orders placed from triage:

## 2024-02-20 NOTE — Progress Notes (Signed)
 MAU doctor stated that NICU said they are full and request transfer if patient is stable. I called NICU Dr. Clotilda and he confirms this and said that [facility] can accommodate the patient. She's from [place] and I asked the patient her preference and she stated that she prefers [place]. Will call them.   Baby is category I with accels and appears to have UCs q50m which pt states she feels and are very mild  SSE negative and SVE FT/50/-3  Bebe Izell Raddle MD Attending Center for [employer] (Faculty Practice) 02/20/2024 Time: [phone]

## 2024-02-20 NOTE — Progress Notes (Signed)
 OB Triage  Triage Start Time:     Chief Complaint:  Chief Complaint  Patient presents with   Rupture of Membranes    Mode of Arrival:  transportation van  Pregnancy Complication History:  No past medical history on file.  Primary Assessment:  Fetal Movement: Present  Contractions:  regular every 3 minutes Vaginal Discharge or LOF: Yes  Vaginal Bleeding:  None Pre-eclampsia s/s:  absent  Fetal Heart Tones:  Mode: External US  reassuring  Vital Signs:  Vital Signs Temp: 37 C (98.6 F) Temp Source: Oral Heart Rate: 92 Resp: 18 BP: 100/60 SpO2: 99 % Any supplemental oxygen?: No Height: 149.9 cm (4' 11) Weight: 51 kg (112 lb 7 oz) Weight Method: Standing Weight BMI (Calculated): 22.7  Pain Score:    7 ,  ,           Maternal Fetal Triage Index (MFTI) score:  2  Disposition/Reassessment Time:  Exam room #5411   SAMANTHA VAZ

## 2024-02-21 ENCOUNTER — Other Ambulatory Visit: Payer: Self-pay | Admitting: Obstetrics and Gynecology

## 2024-02-21 LAB — SYPHILIS: RPR W/REFLEX TO RPR TITER AND TREPONEMAL ANTIBODIES, TRADITIONAL SCREENING AND DIAGNOSIS ALGORITHM: RPR Ser Ql: NONREACTIVE

## 2024-02-21 NOTE — Discharge Summary (Signed)
 MAU note to serve as Discharge Summary as patient was transferred from the MAU to [facility] for further care.  History      [id number]   Arrival date and time: 02/20/24 1301        Chief Complaint  Patient presents with   Non-stress Test        HPI Mary Singleton is a 28 y.o. at [redacted]w[redacted]d by LMP c/w 7w US , who presents from the office after failed NST with recurrent variables with contractions. Contractions started around noon yesterday when patient was cleaning up her living room. They have been irregular but painful. Her pregnancy is complicated by FGR <1%ile. She states that the contractions have continued into today. Has scheduled IOL on 12/27. Endorses good FM and irregular contraction. Denies VB or LOF.          B/Positive/-- (07/09 1207)       Past Medical History:  Diagnosis Date   Medical history non-contributory     Prolapse  cervix      Pessary in place               Past Surgical History:  Procedure Laterality Date   BIOPSY   08/16/2017    Procedure: BIOPSY;  Surgeon: Golda Claudis PENNER, MD;  Location: [facility];  Service: Endoscopy;;  duodenum   ESOPHAGOGASTRODUODENOSCOPY (EGD) WITH PROPOFOL  N/A 08/16/2017    Procedure: ESOPHAGOGASTRODUODENOSCOPY (EGD) WITH PROPOFOL ;  Surgeon: Golda Claudis PENNER, MD;  Location: [facility];  Service: Endoscopy;  Laterality: N/A;  8:30   EYE SURGERY                   Family History  Problem Relation Age of Onset   Cancer Maternal Grandmother     Healthy Mother            Social History         Socioeconomic History   Marital status: Single      Spouse name: Not on file   Number of children: 0   Years of education: Not on file   Highest education level: 12th grade  Occupational History   Not on file  Tobacco Use   Smoking status: Never   Smokeless tobacco: Never  Vaping Use   Vaping status: Never Used  Substance and Sexual Activity   Alcohol use: No   Drug use: No   Sexual activity: Not Currently       Birth control/protection: None  Other Topics Concern   Not on file  Social History Narrative   Not on file    Social Drivers of Health        Tobacco Use: Low Risk (02/20/2024)    Patient History     Smoking Tobacco Use: Never     Smokeless Tobacco Use: Never     Passive Exposure: Not on file  Financial Resource Strain: Low Risk (10/01/2023)    Overall Financial Resource Strain (CARDIA)     Difficulty of Paying Living Expenses: Not hard at all  Food Insecurity: No Food Insecurity (02/02/2024)    Epic     Worried About Programme Researcher, Broadcasting/film/video in the Last Year: Never true     Ran Out of Food in the Last Year: Never true  Transportation Needs: No Transportation Needs (02/02/2024)    Epic     Lack of Transportation (Medical): No     Lack of Transportation (Non-Medical): No  Physical Activity: Insufficiently Active (10/01/2023)    Exercise Vital  Sign     Days of Exercise per Week: 1 day     Minutes of Exercise per Session: 10 min  Stress: Patient Declined (10/01/2023)    [employer] of Occupational Health - Occupational Stress Questionnaire     Feeling of Stress: Patient declined  Social Connections: Moderately Isolated (10/01/2023)    Social Connection and Isolation Panel     Frequency of Communication with Friends and Family: More than three times a week     Frequency of Social Gatherings with Friends and Family: Once a week     Attends Religious Services: 1 to 4 times per year     Active Member of [employer] or Organizations: No     Attends Engineer, Structural: Not on file     Marital Status: Never married  Intimate Partner Violence: Not At Risk (02/02/2024)    Epic     Fear of Current or Ex-Partner: No     Emotionally Abused: No     Physically Abused: No     Sexually Abused: No  Depression (PHQ2-9): Low Risk (12/04/2023)    Depression (PHQ2-9)     PHQ-2 Score: 4  Recent Concern: Depression (PHQ2-9) - Medium Risk (09/11/2023)    Depression (PHQ2-9)     PHQ-2 Score: 6   Alcohol Screen: Low Risk (10/01/2023)    Alcohol Screen     Last Alcohol Screening Score (AUDIT): 0  Housing: Unknown (02/02/2024)    Epic     Unable to Pay for Housing in the Last Year: No     Number of Times Moved in the Last Year: Not on file     Homeless in the Last Year: No  Utilities: Not At Risk (02/02/2024)    Epic     Threatened with loss of utilities: No  Health Literacy: Not on file      [Allergies]  [Allergies]     Allergen Reactions   Pineapple Itching and Swelling   Meloxicam  Rash     [Medications Ordered Prior to [employer] Ordered Prior to [employer] No current facility-administered medications on file prior to encounter.          Current Outpatient Medications on File Prior to Encounter  Medication Sig Dispense Refill   acyclovir  (ZOVIRAX ) 400 MG tablet Take 1 tablet (400 mg total) by mouth 3 (three) times daily. 90 tablet 3   Blood Pressure Monitor MISC For regular home bp monitoring during pregnancy 1 each 0   docusate sodium  (COLACE) 100 MG capsule Take 100 mg by mouth 2 (two) times daily. (Patient not taking: Reported on 02/17/2024)       Prenat-FeCbn-FeAspGl-FA-Omega (OB COMPLETE PETITE) 35-5-1-200 MG CAPS Take 1 tablet by mouth daily. BIN:  [id number]   PCN:  [id number]     GRP:  [id number]    ID:  [id number] 30 capsule 11   [DISCONTINUED] clonazePAM (KLONOPIN) 0.5 MG tablet           Pertinent positives and negative per HPI, all others reviewed and negative   Physical Exam    BP 107/68 (BP Location: Right Arm)   Pulse 74   Temp 98 F (36.7 C)   Resp 18   Ht 4' 11 (1.499 m)   Wt 50.8 kg   LMP 06/15/2023 (Approximate)   SpO2 100%   BMI 22.62 kg/m    Patient Vitals for the past 24 hrs:   BP Temp Pulse Resp SpO2 Height Weight  02/20/24 1333 107/68 -- 74 -- 100 % -- --  02/20/24 1319 (!) 110/56 98 F (36.7 C) 92 18 -- 4' 11 (1.499 m) 50.8 kg      Physical Exam Vitals and nursing note reviewed.  Constitutional:      Appearance:  She is well-developed.  HENT:     Head: Normocephalic and atraumatic.     Mouth/Throat:     Mouth: Mucous membranes are moist.  Eyes:     Extraocular Movements: Extraocular movements intact.  Cardiovascular:     Rate and Rhythm: Normal rate and regular rhythm.  Pulmonary:     Effort: Pulmonary effort is normal.  Abdominal:     Palpations: Abdomen is soft.     Tenderness: There is no abdominal tenderness.  Skin:    Capillary Refill: Capillary refill takes less than 2 seconds.  Neurological:     General: No focal deficit present.     Mental Status: She is alert.     FHT Baseline: 150 bpm Variability: Good {> 6 bpm) Accelerations: Reactive Decelerations: Variable: occasional variable decclerations with contractions Uterine activity: Irregular Frequency: Every 2-10 minutes   Labs Lab Results Last 24 Hours  No results found for this or any previous visit (from the past 24 hours).     Imaging No results found.   MAU Course  Procedures Lab Orders  No laboratory test(s) ordered today       Meds ordered this encounter  Medications   lactated ringers  bolus 1,000 mL      Imaging Orders         US  MFM UA CORD DOPPLER         US  MFM FETAL BPP WO NON STRESS      MDM Moderate (Level 3-4)   Assessment and Plan  Non-reassuring fetal cardiotocographic tracing   Leakage of amniotic fluid   [redacted] weeks gestation of pregnancy   Indication for care in labor or delivery - Plan: CBC, Type and screen, RPR, CBC, Type and screen, RPR     #FWB: NST: Reactive with occasional variable decelerations that occur with contractions.    Mary Singleton is a 28 y.o. at [redacted]w[redacted]d by LMP c/w 7w US , who presents from the office after failed NST with recurrent variables with contractions.   Hx significant for IUGR. Last US : 02/17/24 at [redacted]w[redacted]d IUGR 0.4%ile, (AC 0.1%ile) with BPP 8/8 with low normal fluid.    -Continued to have occasional variables with contractions that seemed to mildly improve  with fluid bolus but continued throughout 5 hours of monitoring. SVE 0.5/50/-3 which is unchanged from office cervical exam. -Irregular contractions every 2-10 minutes. Patient feels mild discomfort with them but otherwise states they are tolerable. -CBC with anemia, Hgb 9.1 -While patient was in MAU, she started complaining of leakage of fluid. Initial fern negative. SSE with small amount of pooling in the vaginal vault. Repeat fern negative but ROM (+) positive.  -Repeat US  this visit BPP 8/8 with AFI 8.7, MVP 2.6. S/D 2.4 49%ile, RI 0.6, 61%ile.   -Patient discussed with attending Dr. Izell -Spoke with NICU who recommended transfer due to lack of NICU available beds.  -Transfer accepted at [facility] for likely PTL and SROM in the setting of PTL.  -Stable for transfer.         Ezra Marquess L Khyler Eschmann, MD/MHA 02/20/2024 1:47 PM

## 2024-02-24 ENCOUNTER — Other Ambulatory Visit: Admitting: Radiology

## 2024-02-24 ENCOUNTER — Ambulatory Visit: Admitting: Obstetrics & Gynecology

## 2024-02-24 VITALS — BP 115/82 | HR 102 | Wt 111.0 lb

## 2024-02-24 DIAGNOSIS — N812 Incomplete uterovaginal prolapse: Secondary | ICD-10-CM

## 2024-02-24 DIAGNOSIS — O0993 Supervision of high risk pregnancy, unspecified, third trimester: Secondary | ICD-10-CM

## 2024-02-24 DIAGNOSIS — Z3A36 36 weeks gestation of pregnancy: Secondary | ICD-10-CM

## 2024-02-24 DIAGNOSIS — O365930 Maternal care for other known or suspected poor fetal growth, third trimester, not applicable or unspecified: Secondary | ICD-10-CM

## 2024-02-24 DIAGNOSIS — O365990 Maternal care for other known or suspected poor fetal growth, unspecified trimester, not applicable or unspecified: Secondary | ICD-10-CM

## 2024-02-24 DIAGNOSIS — O365920 Maternal care for other known or suspected poor fetal growth, second trimester, not applicable or unspecified: Secondary | ICD-10-CM

## 2024-02-24 NOTE — Progress Notes (Signed)
 "   HIGH-RISK PREGNANCY VISIT Patient name: Mary Singleton MRN [id number]  Date of birth: 09-Mar-1995 Chief Complaint:   Routine Prenatal Visit  History of Present Illness:   Mary Singleton is a 28 y.o. G54P0102 female at [redacted]w[redacted]d with an Estimated Date of Delivery: 03/21/24 being seen today for ongoing management of a high-risk pregnancy complicated by     ICD-10-CM   1. Encounter for supervision of high risk pregnancy in third trimester, antepartum  O09.93     2. Featl growth restricition 0.91% with normal Dopplers  O36.5930     3. Cervical prolapse: had pessary for about 7 weeks, resolved  N81.2      .    Today she reports no complaints. Contractions: Not present. Vag. Bleeding: None.  Movement: Present. denies leaking of fluid.      12/04/2023    3:14 PM 09/11/2023   11:13 AM 12/28/2022    9:24 AM 10/11/2020    2:54 PM 01/09/2017    2:56 PM  Depression screen PHQ 2/9  Decreased Interest 1 2 0 0 0  Down, Depressed, Hopeless 1 0 0 0 0  PHQ - 2 Score 2 2 0 0 0  Altered sleeping 0 2  0   Tired, decreased energy 2 2  0   Change in appetite 0 0  0   Feeling bad or failure about yourself  0 0  0   Trouble concentrating 0 0  1   Moving slowly or fidgety/restless 0 0  0   Suicidal thoughts 0 0  0   PHQ-9 Score 4  6   1     Difficult doing work/chores Not difficult at all         Data saved with a previous flowsheet row definition        12/04/2023    3:15 PM 09/11/2023   11:13 AM 10/11/2020    2:54 PM  GAD 7 : Generalized Anxiety Score  Nervous, Anxious, on Edge 1 2 1   Control/stop worrying 1 0 0  Worry too much - different things 1 0 0  Trouble relaxing 0 0 0  Restless 0 0 0  Easily annoyed or irritable 1 0 1  Afraid - awful might happen 1 0 0  Total GAD 7 Score 5 2 2   Anxiety Difficulty Not difficult at all       Review of Systems:   Pertinent items are noted in HPI Denies abnormal vaginal discharge w/ itching/odor/irritation, headaches, visual changes, shortness of  breath, chest pain, abdominal pain, severe nausea/vomiting, or problems with urination or bowel movements unless otherwise stated above. Pertinent History Reviewed:  Reviewed past medical,surgical, social, obstetrical and family history.  Reviewed problem list, medications and allergies. Physical Assessment:   Vitals:   02/24/24 1453  BP: 115/82  Pulse: (!) 102  Weight: 111 lb (50.3 kg)  Body mass index is 22.42 kg/m.           Physical Examination:   General appearance: alert, well appearing, and in no distress  Mental status: alert, oriented to person, place, and time  Skin: warm & dry   Extremities:      Cardiovascular: normal heart rate noted  Respiratory: normal respiratory effort, no distress  Abdomen: gravid, soft, non-tender  Pelvic: Cervical exam deferred         Fetal Status:     Movement: Present    Fetal Surveillance Testing today: BPP 8/8 normal UAD EFW 0.91%   Chaperone: N/A  No results found for this or any previous visit (from the past 24 hours).  Assessment & Plan:  High-risk pregnancy: G2P0102 at [redacted]w[redacted]d with an Estimated Date of Delivery: 03/21/24      ICD-10-CM   1. Encounter for supervision of high risk pregnancy in third trimester, antepartum  O09.93     2. Featl growth restricition 0.91% with normal Dopplers  O36.5930     3. Cervical prolapse: had pessary for about 7 weeks, resolved  N81.2          Meds: No orders of the defined types were placed in this encounter.   Orders: No orders of the defined types were placed in this encounter.    Labs/procedures today: U/S  Treatment Plan:  IOL 12/27    Follow-up: No follow-ups on file.   Future Appointments  Date Time Provider Department Center  02/29/2024 12:00 AM [facility] SCHED ROOM [facility] None    No orders of the defined types were placed in this encounter.  Vonn VEAR Inch  Attending Physician for the Center for [facility] 02/24/2024 3:38 PM  "

## 2024-02-24 NOTE — Progress Notes (Signed)
 US : GA = 36+2 weeks Single active female fetus, cephalic, FHR = 166 bpm, AFI  88.7 cm, MVP = 4.5 cm, posterior pl, gr 2, EFW<1% 0.92%, 1976g, BPP = 8/8, RI: 0.63, 0.60,   AVG: 0.61  69%, cervix closed, nl R ov, L ov not seen

## 2024-02-29 ENCOUNTER — Other Ambulatory Visit: Payer: Self-pay

## 2024-02-29 ENCOUNTER — Encounter (HOSPITAL_COMMUNITY): Payer: Self-pay | Admitting: Obstetrics and Gynecology

## 2024-02-29 ENCOUNTER — Inpatient Hospital Stay (HOSPITAL_COMMUNITY)
Admission: RE | Admit: 2024-02-29 | Discharge: 2024-03-03 | DRG: 805 | Disposition: A | Discharge status: Home / Self Care | Attending: Obstetrics & Gynecology | Admitting: Obstetrics & Gynecology

## 2024-02-29 ENCOUNTER — Inpatient Hospital Stay (HOSPITAL_COMMUNITY)

## 2024-02-29 DIAGNOSIS — B081 Molluscum contagiosum: Secondary | ICD-10-CM | POA: Diagnosis present

## 2024-02-29 DIAGNOSIS — O365930 Maternal care for other known or suspected poor fetal growth, third trimester, not applicable or unspecified: Principal | ICD-10-CM | POA: Diagnosis present

## 2024-02-29 DIAGNOSIS — O326XX0 Maternal care for compound presentation, not applicable or unspecified: Secondary | ICD-10-CM | POA: Diagnosis present

## 2024-02-29 DIAGNOSIS — O99892 Other specified diseases and conditions complicating childbirth: Secondary | ICD-10-CM | POA: Diagnosis present

## 2024-02-29 DIAGNOSIS — O9902 Anemia complicating childbirth: Secondary | ICD-10-CM | POA: Diagnosis present

## 2024-02-29 DIAGNOSIS — Z3A37 37 weeks gestation of pregnancy: Secondary | ICD-10-CM | POA: Diagnosis not present

## 2024-02-29 DIAGNOSIS — Z880 Allergy status to penicillin: Secondary | ICD-10-CM | POA: Diagnosis not present

## 2024-02-29 DIAGNOSIS — O411230 Chorioamnionitis, third trimester, not applicable or unspecified: Secondary | ICD-10-CM | POA: Diagnosis present

## 2024-02-29 DIAGNOSIS — O0990 Supervision of high risk pregnancy, unspecified, unspecified trimester: Secondary | ICD-10-CM

## 2024-02-29 DIAGNOSIS — O365990 Maternal care for other known or suspected poor fetal growth, unspecified trimester, not applicable or unspecified: Principal | ICD-10-CM | POA: Diagnosis present

## 2024-02-29 LAB — TYPE AND SCREEN
ABO/RH(D): B POS
Antibody Screen: NEGATIVE

## 2024-02-29 LAB — CBC
HCT: 28.9 % — ABNORMAL LOW (ref 36.0–46.0)
Hemoglobin: 8.9 g/dL — ABNORMAL LOW (ref 12.0–15.0)
MCH: 24.3 pg — ABNORMAL LOW (ref 26.0–34.0)
MCHC: 30.8 g/dL (ref 30.0–36.0)
MCV: 79 fL — ABNORMAL LOW (ref 80.0–100.0)
Platelets: 245 K/uL (ref 150–400)
RBC: 3.66 MIL/uL — ABNORMAL LOW (ref 3.87–5.11)
RDW: 14.6 % (ref 11.5–15.5)
WBC: 11.5 K/uL — ABNORMAL HIGH (ref 4.0–10.5)
nRBC: 0.3 % — ABNORMAL HIGH (ref 0.0–0.2)

## 2024-02-29 MED ORDER — ACETAMINOPHEN 325 MG PO TABS: 650.0000 mg | ORAL_TABLET | ORAL | Status: DC | PRN

## 2024-02-29 MED ORDER — PHENYLEPHRINE 80 MCG/ML (10ML) SYRINGE FOR IV PUSH (FOR BLOOD PRESSURE SUPPORT): 80.0000 ug | PREFILLED_SYRINGE | INTRAVENOUS | Status: DC | PRN

## 2024-02-29 MED ORDER — EPHEDRINE 5 MG/ML INJ: 10.0000 mg | INTRAVENOUS | Status: DC | PRN

## 2024-02-29 MED ORDER — PHENYLEPHRINE 80 MCG/ML (10ML) SYRINGE FOR IV PUSH (FOR BLOOD PRESSURE SUPPORT)
80.0000 ug | PREFILLED_SYRINGE | INTRAVENOUS | Status: DC | PRN
  Filled 2024-02-29: qty 10

## 2024-02-29 MED ORDER — LIDOCAINE HCL (PF) 1 % IJ SOLN: 30.0000 mL | INTRAMUSCULAR | Status: DC | PRN

## 2024-02-29 MED ORDER — OXYCODONE-ACETAMINOPHEN 5-325 MG PO TABS: 2.0000 | ORAL_TABLET | ORAL | Status: DC | PRN

## 2024-02-29 MED ORDER — DIPHENHYDRAMINE HCL 50 MG/ML IJ SOLN: 12.5000 mg | INTRAMUSCULAR | Status: DC | PRN

## 2024-02-29 MED ORDER — FENTANYL-BUPIVACAINE-NACL 0.5-0.125-0.9 MG/250ML-% EP SOLN
12.0000 mL/h | EPIDURAL | Status: DC | PRN
  Administered 2024-03-01: 12 mL/h via EPIDURAL
  Filled 2024-02-29: qty 250

## 2024-02-29 MED ORDER — FENTANYL CITRATE (PF) 100 MCG/2ML IJ SOLN
100.0000 ug | INTRAMUSCULAR | Status: DC | PRN
  Administered 2024-02-29 (×2): 100 ug via INTRAVENOUS
  Administered 2024-02-29: 50 ug via INTRAVENOUS
  Filled 2024-02-29 (×5): qty 2

## 2024-02-29 MED ORDER — LACTATED RINGERS IV SOLN: 500.0000 mL | Freq: Once | INTRAVENOUS | Status: DC

## 2024-02-29 MED ORDER — HYDROXYZINE HCL 50 MG PO TABS: 50.0000 mg | ORAL_TABLET | Freq: Four times a day (QID) | ORAL | Status: DC | PRN

## 2024-02-29 MED ORDER — OXYCODONE-ACETAMINOPHEN 5-325 MG PO TABS: 1.0000 | ORAL_TABLET | ORAL | Status: DC | PRN

## 2024-02-29 MED ORDER — FLEET ENEMA RE ENEM: 1.0000 | ENEMA | RECTAL | Status: DC | PRN

## 2024-02-29 MED ORDER — MISOPROSTOL 25 MCG QUARTER TABLET
25.0000 ug | ORAL_TABLET | ORAL | Status: DC
  Administered 2024-02-29: 25 ug via VAGINAL
  Filled 2024-02-29: qty 1

## 2024-02-29 MED ORDER — ONDANSETRON HCL 4 MG/2ML IJ SOLN
4.0000 mg | Freq: Four times a day (QID) | INTRAMUSCULAR | Status: DC | PRN
  Administered 2024-03-01: 4 mg via INTRAVENOUS
  Filled 2024-02-29: qty 2

## 2024-02-29 MED ORDER — OXYTOCIN-SODIUM CHLORIDE 30-0.9 UT/500ML-% IV SOLN
2.5000 [IU]/h | INTRAVENOUS | Status: DC
  Filled 2024-02-29: qty 500

## 2024-02-29 MED ORDER — SOD CITRATE-CITRIC ACID 500-334 MG/5ML PO SOLN
30.0000 mL | ORAL | Status: DC | PRN
  Administered 2024-03-01: 30 mL via ORAL
  Filled 2024-02-29: qty 30

## 2024-02-29 MED ORDER — OXYTOCIN BOLUS FROM INFUSION
333.0000 mL | Freq: Once | INTRAVENOUS | Status: AC
  Administered 2024-03-01: 333 mL via INTRAVENOUS

## 2024-02-29 NOTE — H&P (Signed)
 OBSTETRIC ADMISSION HISTORY AND PHYSICAL  Mary Singleton is a 28 y.o. female [phone] with IUP at [redacted]w[redacted]d (dated by LMP c/w 7wUS, Estimated Date of Delivery: 03/21/24) presenting for IOL due to IUGR. .   She reports +FMs, No LOF, no VB, no blurry vision, headaches or peripheral edema, and RUQ pain.    She plans on breast and formula feeding. She request BTL for birth control.  She received [facility] care at [facility]   Prenatal History/Complications:   Concern for HSV IUGR  Past Medical History: Past Medical History:  Diagnosis Date   Medical history non-contributory    Prolapse  cervix    Pessary in place    Past Surgical History: Past Surgical History:  Procedure Laterality Date   BIOPSY  08/16/2017   Procedure: BIOPSY;  Surgeon: Golda Claudis PENNER, MD;  Location: [facility];  Service: Endoscopy;;  duodenum   ESOPHAGOGASTRODUODENOSCOPY (EGD) WITH PROPOFOL  N/A 08/16/2017   Procedure: ESOPHAGOGASTRODUODENOSCOPY (EGD) WITH PROPOFOL ;  Surgeon: Golda Claudis PENNER, MD;  Location: [facility];  Service: Endoscopy;  Laterality: N/A;  8:30   EYE SURGERY      Obstetrical History: OB History     Gravida  2   Para  1   Term  0   Preterm  1   AB  0   Living  2      SAB  0   IAB  0   Ectopic  0   Multiple  2   Live Births  2           Social History Social History   Socioeconomic History   Marital status: Single    Spouse name: Not on file   Number of children: 0   Years of education: Not on file   Highest education level: 12th grade  Occupational History   Not on file  Tobacco Use   Smoking status: Never   Smokeless tobacco: Never  Vaping Use   Vaping status: Never Used  Substance and Sexual Activity   Alcohol use: No   Drug use: No   Sexual activity: Not Currently    Birth control/protection: None  Other Topics Concern   Not on file  Social History Narrative   Not on file   Social Drivers of Health   Tobacco Use: Low Risk (02/29/2024)    Patient History    Smoking Tobacco Use: Never    Smokeless Tobacco Use: Never    Passive Exposure: Not on file  Financial Resource Strain: Low Risk (10/01/2023)   Overall Financial Resource Strain (CARDIA)    Difficulty of Paying Living Expenses: Not hard at all  Food Insecurity: No Food Insecurity (02/29/2024)   Epic    Worried About Radiation Protection Practitioner of Food in the Last Year: Never true    Ran Out of Food in the Last Year: Never true  Transportation Needs: No Transportation Needs (02/29/2024)   Epic    Lack of Transportation (Medical): No    Lack of Transportation (Non-Medical): No  Physical Activity: Insufficiently Active (10/01/2023)   Exercise Vital Sign    Days of Exercise per Week: 1 day    Minutes of Exercise per Session: 10 min  Stress: Patient Declined (10/01/2023)   [employer] of Occupational Health - Occupational Stress Questionnaire    Feeling of Stress: Patient declined  Social Connections: Moderately Isolated (10/01/2023)   Social Connection and Isolation Panel    Frequency of Communication with Friends and Family: More than three  times a week    Frequency of Social Gatherings with Friends and Family: Once a week    Attends Religious Services: 1 to 4 times per year    Active Member of Clubs or Organizations: No    Attends Engineer, Structural: Not on file    Marital Status: Never married  Depression (PHQ2-9): Low Risk (12/04/2023)   Depression (PHQ2-9)    PHQ-2 Score: 4  Recent Concern: Depression (PHQ2-9) - Medium Risk (09/11/2023)   Depression (PHQ2-9)    PHQ-2 Score: 6  Alcohol Screen: Low Risk (10/01/2023)   Alcohol Screen    Last Alcohol Screening Score (AUDIT): 0  Housing: Low Risk (02/29/2024)   Epic    Unable to Pay for Housing in the Last Year: No    Number of Times Moved in the Last Year: 0    Homeless in the Last Year: No  Utilities: Not At Risk (02/29/2024)   Epic    Threatened with loss of utilities: No  Health Literacy: Not on file     Family History: Family History  Problem Relation Age of Onset   Cancer Maternal Grandmother    Healthy Mother     Allergies: Allergies[1]  Medications Prior to Admission  Medication Sig Dispense Refill Last Dose/Taking   acyclovir  (ZOVIRAX ) 400 MG tablet Take 1 tablet (400 mg total) by mouth 3 (three) times daily. 90 tablet 3    Blood Pressure Monitor MISC For regular home bp monitoring during pregnancy 1 each 0    docusate sodium  (COLACE) 100 MG capsule Take 100 mg by mouth 2 (two) times daily. (Patient not taking: Reported on 02/17/2024)      Prenat-FeCbn-FeAspGl-FA-Omega (OB COMPLETE PETITE) 35-5-1-200 MG CAPS Take 1 tablet by mouth daily. BIN:  [id number]   PCN:  [id number]     GRP:  [id number]    ID:  [id number] 30 capsule 11      Review of Systems  All systems reviewed and negative except as stated in HPI.  Blood pressure 116/78, pulse 100, temperature 98.2 F (36.8 C), temperature source Oral, resp. rate 17, height 4' 11 (1.499 m), weight 50.4 kg, last menstrual period 06/15/2023. General appearance: alert, cooperative, and appears stated age Lungs: breathing comfortably on room air Heart: regular rate Abdomen: soft, non-tender; gravid Extremities: no edema of bilateral lower extremities DTR's intact Presentation: cephalic Fetal monitoringBaseline: 160 bpm, Variability: Good {> 6 bpm), Accelerations: Reactive, and Decelerations: Absent Uterine activity: None  Dilation: Fingertip Effacement (%): 50 Station: -3 Exam by:: Dr Magali   Prenatal labs: ABO, Rh: --/--/B POS (12/27 2045) Antibody: NEG (12/27 2045) Rubella: 1.03 (07/09 1207) RPR: NON REACTIVE (12/18 1737)  HBsAg: Negative (07/09 1207)  HIV: Non Reactive (10/20 0817)  GBS:   Negative  2 hr Glucola normal Genetic screening  LR female Anatomy US  Appears normal Last US : At [redacted]w[redacted]d - cephalic presentation, EFW 1976g (0.92 %tile),  Prenatal Transfer Tool  Maternal Diabetes: No Genetic Screening:  Normal Maternal Ultrasounds/Referrals: Normal Fetal Ultrasounds or other Referrals:  None Maternal Substance Abuse:  No Significant Maternal Medications:  None Significant Maternal Lab Results:  Group B Strep negative Number of Prenatal Visits:greater than 3 verified prenatal visits Other Comments:  None  Results for orders placed or performed during the hospital encounter of 02/29/24 (from the past 24 hours)  CBC   Collection Time: 02/29/24  8:11 PM  Result Value Ref Range   WBC 11.5 (H) 4.0 - 10.5 K/uL   RBC 3.66 (L) 3.87 -  5.11 MIL/uL   Hemoglobin 8.9 (L) 12.0 - 15.0 g/dL   HCT 71.0 (L) 63.9 - 53.9 %   MCV 79.0 (L) 80.0 - 100.0 fL   MCH 24.3 (L) 26.0 - 34.0 pg   MCHC 30.8 30.0 - 36.0 g/dL   RDW 85.3 88.4 - 84.4 %   Platelets 245 150 - 400 K/uL   nRBC 0.3 (H) 0.0 - 0.2 %  Type and screen [facility]   Collection Time: 02/29/24  8:45 PM  Result Value Ref Range   ABO/RH(D) B POS    Antibody Screen NEG    Sample Expiration      03/03/2024,2359 Performed at [facility], 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 G2P0102 at [redacted]w[redacted]d here for IOL due to IUGR <1%ile  #Labor: IOL process explained to the patient. FB placed with speculum exam and 25mcg of cytotec  given. #Pain: Per patient request, planning epidural. #FWB: Cat I  #ID:  GBS negative at [facility]. #MOF: Breast and Formula #MOC: BTL, consent signed 01/02/24 #Circ:  N/A #IUGR: <1%ile, normal dopplers. Will watch tracing closely for signs of fetal intolerance to labor.  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 Does have genital lesions that were swabbed at  [facility] and negative for HSV. On appearance, more consistent with Molluscum Contagiosum. Ok to proceed with vaginal delivery.   Barkley Angles, MD OB Fellow, Faculty [facility], Center for [facility] 02/29/2024 11:30 PM        [1]  Allergies Allergen Reactions   Penicillins Hives   Pineapple Itching and Swelling   Meloxicam  Rash

## 2024-03-01 ENCOUNTER — Inpatient Hospital Stay (HOSPITAL_COMMUNITY): Admitting: Anesthesiology

## 2024-03-01 ENCOUNTER — Encounter (HOSPITAL_COMMUNITY): Payer: Self-pay | Admitting: Obstetrics & Gynecology

## 2024-03-01 DIAGNOSIS — O365930 Maternal care for other known or suspected poor fetal growth, third trimester, not applicable or unspecified: Secondary | ICD-10-CM

## 2024-03-01 DIAGNOSIS — O326XX0 Maternal care for compound presentation, not applicable or unspecified: Secondary | ICD-10-CM

## 2024-03-01 DIAGNOSIS — Z3A37 37 weeks gestation of pregnancy: Secondary | ICD-10-CM

## 2024-03-01 DIAGNOSIS — O411230 Chorioamnionitis, third trimester, not applicable or unspecified: Secondary | ICD-10-CM

## 2024-03-01 DIAGNOSIS — O365990 Maternal care for other known or suspected poor fetal growth, unspecified trimester, not applicable or unspecified: Principal | ICD-10-CM | POA: Diagnosis present

## 2024-03-01 LAB — SYPHILIS: RPR W/REFLEX TO RPR TITER AND TREPONEMAL ANTIBODIES, TRADITIONAL SCREENING AND DIAGNOSIS ALGORITHM: RPR Ser Ql: NONREACTIVE

## 2024-03-01 MED ORDER — GENTAMICIN SULFATE 40 MG/ML IJ SOLN
5.0000 mg/kg | INTRAVENOUS | Status: DC
  Administered 2024-03-01: 250 mg via INTRAVENOUS
  Filled 2024-03-01 (×2): qty 6.25

## 2024-03-01 MED ORDER — OXYCODONE HCL 5 MG PO TABS: 5.0000 mg | ORAL_TABLET | ORAL | Status: DC | PRN

## 2024-03-01 MED ORDER — PRENATAL MULTIVITAMIN CH
1.0000 | ORAL_TABLET | Freq: Every day | ORAL | Status: DC
  Administered 2024-03-02 – 2024-03-03 (×2): 1 via ORAL

## 2024-03-01 MED ORDER — MAGNESIUM HYDROXIDE 400 MG/5ML PO SUSP: 30.0000 mL | ORAL | Status: DC | PRN

## 2024-03-01 MED ORDER — ONDANSETRON HCL 4 MG PO TABS: 4.0000 mg | ORAL_TABLET | ORAL | Status: DC | PRN

## 2024-03-01 MED ORDER — WITCH HAZEL-GLYCERIN EX PADS: 1.0000 | MEDICATED_PAD | CUTANEOUS | Status: DC | PRN

## 2024-03-01 MED ORDER — OXYCODONE HCL 5 MG PO TABS: 10.0000 mg | ORAL_TABLET | ORAL | Status: DC | PRN

## 2024-03-01 MED ORDER — ONDANSETRON HCL 4 MG/2ML IJ SOLN: 4.0000 mg | INTRAMUSCULAR | Status: DC | PRN

## 2024-03-01 MED ORDER — COCONUT OIL OIL: 1.0000 | TOPICAL_OIL | Status: DC | PRN

## 2024-03-01 MED ORDER — CLINDAMYCIN PHOSPHATE 900 MG/50ML IV SOLN
900.0000 mg | Freq: Three times a day (TID) | INTRAVENOUS | Status: DC
  Administered 2024-03-01: 900 mg via INTRAVENOUS
  Filled 2024-03-01 (×3): qty 50

## 2024-03-01 MED ORDER — DIBUCAINE (PERIANAL) 1 % EX OINT: 1.0000 | TOPICAL_OINTMENT | CUTANEOUS | Status: DC | PRN

## 2024-03-01 MED ORDER — TERBUTALINE SULFATE 1 MG/ML IJ SOLN
0.2500 mg | Freq: Once | INTRAMUSCULAR | Status: AC | PRN
  Administered 2024-03-01: 0.25 mg via SUBCUTANEOUS
  Filled 2024-03-01: qty 1

## 2024-03-01 MED ORDER — OXYTOCIN-SODIUM CHLORIDE 30-0.9 UT/500ML-% IV SOLN
1.0000 m[IU]/min | INTRAVENOUS | Status: DC
  Administered 2024-03-01: 2 m[IU]/min via INTRAVENOUS

## 2024-03-01 MED ORDER — BENZOCAINE-MENTHOL 20-0.5 % EX AERO
1.0000 | INHALATION_SPRAY | CUTANEOUS | Status: DC | PRN
  Administered 2024-03-01: 1 via TOPICAL
  Filled 2024-03-01: qty 56

## 2024-03-01 MED ORDER — MEASLES, MUMPS & RUBELLA VAC ~~LOC~~ SUSR: 0.5000 mL | Freq: Once | SUBCUTANEOUS | Status: DC

## 2024-03-01 MED ORDER — LACTATED RINGERS IV SOLN: INTRAVENOUS | Status: DC

## 2024-03-01 MED ORDER — LIDOCAINE HCL (PF) 1 % IJ SOLN
INTRAMUSCULAR | Status: DC | PRN
  Administered 2024-03-01: 6 mL via EPIDURAL

## 2024-03-01 MED ORDER — TETANUS-DIPHTH-ACELL PERTUSSIS 5-2-15.5 LF-MCG/0.5 IM SUSP: 0.5000 mL | Freq: Once | INTRAMUSCULAR | Status: DC

## 2024-03-01 MED ORDER — DIPHENHYDRAMINE HCL 25 MG PO CAPS: 25.0000 mg | ORAL_CAPSULE | Freq: Four times a day (QID) | ORAL | Status: DC | PRN

## 2024-03-01 MED ORDER — ACETAMINOPHEN 500 MG PO TABS
1000.0000 mg | ORAL_TABLET | Freq: Four times a day (QID) | ORAL | Status: DC
  Administered 2024-03-01 – 2024-03-03 (×7): 1000 mg via ORAL
  Filled 2024-03-01 (×5): qty 2

## 2024-03-01 MED ORDER — SIMETHICONE 80 MG PO CHEW: 80.0000 mg | CHEWABLE_TABLET | ORAL | Status: DC | PRN

## 2024-03-01 NOTE — Anesthesia Procedure Notes (Signed)
 Epidural Patient location during procedure: OB Start time: 03/01/2024 1:20 AM End time: 03/01/2024 1:30 AM  Staffing Anesthesiologist: Niels Marien CROME, MD Performed: anesthesiologist   Preanesthetic Checklist Completed: patient identified, IV checked, risks and benefits discussed, monitors and equipment checked, pre-op evaluation and timeout performed  Epidural Patient position: sitting Prep: DuraPrep and site prepped and draped Patient monitoring: continuous pulse ox, blood pressure, heart rate and cardiac monitor Approach: midline Location: L3-L4 Injection technique: LOR air  Needle:  Needle type: Tuohy  Needle gauge: 17 G Needle length: 9 cm Needle insertion depth: 6 cm Catheter type: closed end flexible Catheter size: 19 Gauge Catheter at skin depth: 11 cm Test dose: negative  Assessment Sensory level: T8 Events: blood not aspirated, no cerebrospinal fluid, injection not painful, no injection resistance, no paresthesia and negative IV test  Additional Notes Patient identified. Risks/Benefits/Options discussed with patient including but not limited to bleeding, infection, nerve damage, paralysis, failed block, incomplete pain control, headache, blood pressure changes, nausea, vomiting, reactions to medication both or allergic, itching and postpartum back pain. Confirmed with bedside nurse the patient's most recent platelet count. Confirmed with patient that they are not currently taking any anticoagulation, have any bleeding history or any family history of bleeding disorders. Patient expressed understanding and wished to proceed. All questions were answered. Sterile technique was used throughout the entire procedure. Please see nursing notes for vital signs. Test dose was given through epidural catheter and negative prior to continuing to dose epidural or start infusion. Warning signs of high block given to the patient including shortness of breath, tingling/numbness in  hands, complete motor block, or any concerning symptoms with instructions to call for help. Patient was given instructions on fall risk and not to get out of bed. All questions and concerns addressed with instructions to call with any issues or inadequate analgesia.  Reason for block:procedure for pain

## 2024-03-01 NOTE — Discharge Summary (Signed)
 "    Postpartum Discharge Summary  Date of Service updated***     Patient Name: Mary Singleton DOB: [birth year] MRN: [id number]  Date of admission: 02/29/2024 Delivery date:03/01/2024 Delivering provider: CLAUDENE, Digby Groeneveld  Date of discharge: 03/01/2024  Admitting diagnosis: Fetal growth restriction antepartum [O36.5990] Intrauterine pregnancy: [redacted]w[redacted]d     Secondary diagnosis:  Principal Problem:   Fetal growth restriction antepartum Active Problems:   Vaginal delivery  Additional problems: ***    Discharge diagnosis: Term Pregnancy Delivered                                              Post partum procedures:{Postpartum procedures:23558} Augmentation: Pitocin , Cytotec , and IP Foley Complications: Intrauterine Inflammation or infection (Chorioamniotis) based on fetal tachycardia.   Hospital course: Induction of Labor With Vaginal Delivery   28 y.o. yo G2P0102 at [redacted]w[redacted]d was admitted to the hospital 02/29/2024 for induction of labor.  Indication for induction: Fetal growth restriction.  Patient had an labor course complicated by fetal tachycardia to 180s.  Presumed chorioamnionitis treated with gentamicin  and clindamycin . Membrane Rupture Time/Date: 2:55 PM,02/20/2024  Delivery Method:Vaginal, Spontaneous Operative Delivery:N/A Episiotomy: None Lacerations:  None Details of delivery can be found in separate delivery note.  Patient had a postpartum course complicated by***. Patient is discharged home 03/01/2024.  Newborn Data: Birth date:03/01/2024 Birth time:7:20 PM Gender:Female Living status:Living Apgars:7 ,9  Weight:2230 g  Magnesium  Sulfate received: No BMZ received: No Rhophylac:N/A MMR:N/A T-DaP:Declined Flu: Declined RSV Vaccine received: Declined Transfusion:{Transfusion received:30440034}  Immunizations received: Immunization History  Administered Date(s) Administered   DTaP 04/18/1998, 06/17/1998, 08/17/1998   HIB (PRP-OMP) 04/18/1998, 06/17/1998,  08/17/1998   Hepatitis B, ADULT 08/18/1998   IPV 04/18/1998, 06/17/1998    Physical exam  Vitals:   03/01/24 1830 03/01/24 1930 03/01/24 2000 03/01/24 2015  BP: 102/68 (!) 92/50 (!) 87/49 (!) 97/59  Pulse: 88 86 90 (!) 101  Resp:  16    Temp:    99.4 F (37.4 C)  TempSrc:    Axillary  SpO2:      Weight:      Height:       General: {Exam; general:21111117} Lochia: {Desc; appropriate/inappropriate:30686::appropriate} Uterine Fundus: {Desc; firm/soft:30687} Incision: {Exam; incision:21111123} DVT Evaluation: {Exam; dvt:2111122} Labs: Lab Results  Component Value Date   WBC 11.5 (H) 02/29/2024   HGB 8.9 (L) 02/29/2024   HCT 28.9 (L) 02/29/2024   MCV 79.0 (L) 02/29/2024   PLT 245 02/29/2024      Latest Ref Rng & Units 01/02/2023    8:07 PM  CMP  Glucose 70 - 99 mg/dL 99   BUN 6 - 20 mg/dL 10   Creatinine 9.55 - 1.00 mg/dL 9.43   Sodium 864 - 854 mmol/L 136   Potassium 3.5 - 5.1 mmol/L 2.7   Chloride 98 - 111 mmol/L 105   CO2 22 - 32 mmol/L 22   Calcium  8.9 - 10.3 mg/dL 9.2   Total Protein 6.5 - 8.1 g/dL 7.5   Total Bilirubin 0.3 - 1.2 mg/dL 0.7   Alkaline Phos 38 - 126 U/L 57   AST 15 - 41 U/L 20   ALT 0 - 44 U/L 18    Edinburgh Score:    04/17/2021    4:30 PM  Edinburgh Postnatal Depression Scale Screening Tool  I have been able to laugh and see the funny side of things. 0  I have looked forward with enjoyment to things. 0   I have blamed myself unnecessarily when things went wrong. 0   I have been anxious or worried for no good reason. 0   I have felt scared or panicky for no good reason. 0   Things have been getting on top of me. 0   I have been so unhappy that I have had difficulty sleeping. 0   I have felt sad or miserable. 0   I have been so unhappy that I have been crying. 0   The thought of harming myself has occurred to me. 0   Edinburgh Postnatal Depression Scale Total 0      Data saved with a previous flowsheet row definition   No data  recorded  After visit meds:  Allergies as of 03/01/2024       Reactions   Penicillins Hives   Pineapple Itching, Swelling   Meloxicam  Rash     Med Rec must be completed prior to using this [facility]***        Discharge home in stable condition Infant Feeding: Bottle and Breast Infant Disposition:{CHL IP OB HOME WITH FNUYZM:76418} Discharge instruction: per After Visit Summary and Postpartum booklet. Activity: Advance as tolerated. Pelvic rest for 6 weeks.  Diet: iron rich diet Future Appointments:No future appointments. Follow up Visit:   Please schedule this patient for a In person postpartum visit in 4 weeks with the following provider: Any provider. Additional Postpartum F/U:Incision check 1 week  High risk pregnancy complicated by: FGR Delivery mode:  Vaginal, Spontaneous Anticipated Birth Control:  plan PP BTL   03/01/2024 Mary Singleton  Claudene, CNM    "

## 2024-03-01 NOTE — Progress Notes (Signed)
 HOORIA GASPARINI is a 28 y.o. G2P0102 at [redacted]w[redacted]d.  Subjective: Called to bedside for fetal heart rate deceleration.  Patient denies pain.  Objective: BP 92/66   Pulse 68   Temp (!) 97.4 F (36.3 C) (Oral)   Resp 16   Ht 4' 11 (1.499 m)   Wt 50.4 kg   LMP 06/15/2023 (Approximate)   SpO2 100%   BMI 22.46 kg/m    FHT:  FHR: 160 bpm, variability: Moderate,  accelerations: 15 X15,  decelerations: Few variable and late decelerations.  Prolonged deceleration to 80s x 9 minutes with intermittent returns to baseline. UC:   Q 1-3 minutes, moderate Dilation: 5 Effacement (%): 50 Station: -2 Presentation: Vertex Exam by:: Teofil Maniaci, cnm IUPC placed by RN at [facility].  Large gush of clear fluid during cervical exam per RN.  CNM performed cervical exam during deceleration.  No cord palpated.  Labs: Results for orders placed or performed during the hospital encounter of 02/29/24 (from the past 24 hours)  CBC     Status: Abnormal   Collection Time: 02/29/24  8:11 PM  Result Value Ref Range   WBC 11.5 (H) 4.0 - 10.5 K/uL   RBC 3.66 (L) 3.87 - 5.11 MIL/uL   Hemoglobin 8.9 (L) 12.0 - 15.0 g/dL   HCT 71.0 (L) 63.9 - 53.9 %   MCV 79.0 (L) 80.0 - 100.0 fL   MCH 24.3 (L) 26.0 - 34.0 pg   MCHC 30.8 30.0 - 36.0 g/dL   RDW 85.3 88.4 - 84.4 %   Platelets 245 150 - 400 K/uL   nRBC 0.3 (H) 0.0 - 0.2 %  RPR     Status: None   Collection Time: 02/29/24  8:11 PM  Result Value Ref Range   RPR Ser Ql NON REACTIVE NON REACTIVE  Type and screen [facility]     Status: None   Collection Time: 02/29/24  8:45 PM  Result Value Ref Range   ABO/RH(D) B POS    Antibody Screen NEG    Sample Expiration      03/03/2024,2359 Performed at [facility], [address] [place], [place] [address]     Assessment / Plan: [redacted]w[redacted]d week IUP ROM x Days: 9 Hours: 22 Minutes: 23 Labor: Induction of labor for FGR.  Latent progressing appropriately after Cytotec , Foley bulb, now on Pitocin .  Slow  progress likely due to inadequate contractions and thick cervix after Foley bulb.  Cervix is now very soft.  Pitocin  discontinued during deceleration.  Will assess MVU's  and fetal heart rate pattern over the next 30 minutes and determine if Pitocin  can or should be restarted.  Plan amnioinfusion if variable decelerations resume. Fetal Wellbeing:  Category 2. Prior to prolonged deceleration fetal heart rate had been reassuring due to moderate variability, accelerations.  After prolonged deceleration MD and CNM have greater concern about whether baby will tolerate contractions.  Brief discussion with patient that C-section would be recommended for additional significant decelerations or inability to get labor to progress.  Patient verbalized understanding. Pain Control: Epidural working well. Anticipated MOD: Still hopeful for vaginal birth but explained that C-section may become indicated.  Dr. Ozan at bedside reviewing tracing and talking to patient.  Claudene, Aldean Suddeth , CNM 03/01/2024 1:18 PM

## 2024-03-01 NOTE — Anesthesia Preprocedure Evaluation (Addendum)
 Anesthesia Evaluation  Patient identified by MRN, date of birth, ID band Patient awake    Reviewed: Allergy & Precautions, NPO status , Patient's Chart, lab work & pertinent test results  Airway Mallampati: II  TM Distance: >3 FB Neck ROM: Full    Dental no notable dental hx.    Pulmonary neg pulmonary ROS   Pulmonary exam normal breath sounds clear to auscultation       Cardiovascular negative cardio ROS Normal cardiovascular exam Rhythm:Regular Rate:Normal     Neuro/Psych negative neurological ROS  negative psych ROS   GI/Hepatic negative GI ROS, Neg liver ROS,,,  Endo/Other  negative endocrine ROS    Renal/GU negative Renal ROS  negative genitourinary   Musculoskeletal negative musculoskeletal ROS (+)    Abdominal   Peds  Hematology negative hematology ROS (+)   Anesthesia Other Findings IOL for IUGR  Reproductive/Obstetrics (+) Pregnancy                             Anesthesia Physical Anesthesia Plan  ASA: 2  Anesthesia Plan: Epidural   Post-op Pain Management:    Induction:   PONV Risk Score and Plan: Treatment may vary due to age or medical condition  Airway Management Planned: Natural Airway  Additional Equipment:   Intra-op Plan:   Post-operative Plan:   Informed Consent: I have reviewed the patients History and Physical, chart, labs and discussed the procedure including the risks, benefits and alternatives for the proposed anesthesia with the patient or authorized representative who has indicated his/her understanding and acceptance.       Plan Discussed with: Anesthesiologist  Anesthesia Plan Comments: (Patient identified. Risks, benefits, options discussed with patient including but not limited to bleeding, infection, nerve damage, paralysis, failed block, incomplete pain control, headache, blood pressure changes, nausea, vomiting, reactions to medication,  itching, and post partum back pain. Confirmed with bedside nurse the patient's most recent platelet count. Confirmed with the patient that they are not taking any anticoagulation, have any bleeding history or any family history of bleeding disorders. Patient expressed understanding and wishes to proceed. All questions were answered. )       Anesthesia Quick Evaluation

## 2024-03-01 NOTE — Progress Notes (Signed)
 Mary Singleton is a 28 y.o. G2P0102 at [redacted]w[redacted]d.  Subjective: Comfortable with epidural  Objective: BP 99/61   Pulse 90   Temp 98.2 F (36.8 C) (Axillary)   Resp 18   Ht 4' 11 (1.499 m)   Wt 50.4 kg   LMP 06/15/2023 (Approximate)   SpO2 100%   BMI 22.46 kg/m    FHT:  FHR: 145 bpm, variability: Moderate,  accelerations: 15 X15,  decelerations: None UC:   Q 2-3 minutes, moderate Dilation: 4.5 Effacement (%): 50 Station: -2 Presentation: Vertex Exam by:: RN  Labs: N/A  Assessment / Plan: [redacted]w[redacted]d week IUP ROM x 11 hours Labor: Early.  Progressing appropriately after Cytotec  and Foley bulb.  Cervix still thick.  Continue Pitocin . Fetal Wellbeing:  Category 1 Pain Control: Epidural Anticipated MOD: SVD  Shevaun Lovan , CNM 03/01/2024 11:30 AM

## 2024-03-02 ENCOUNTER — Encounter (HOSPITAL_COMMUNITY)
Admission: RE | Disposition: A | Discharge status: Home / Self Care | Payer: Self-pay | Source: Home / Self Care | Attending: Obstetrics & Gynecology

## 2024-03-02 ENCOUNTER — Encounter: Admitting: Women's Health

## 2024-03-02 ENCOUNTER — Other Ambulatory Visit

## 2024-03-02 LAB — CBC
HCT: 25.7 % — ABNORMAL LOW (ref 36.0–46.0)
Hemoglobin: 8.1 g/dL — ABNORMAL LOW (ref 12.0–15.0)
MCH: 24.7 pg — ABNORMAL LOW (ref 26.0–34.0)
MCHC: 31.5 g/dL (ref 30.0–36.0)
MCV: 78.4 fL — ABNORMAL LOW (ref 80.0–100.0)
Platelets: 196 K/uL (ref 150–400)
RBC: 3.28 MIL/uL — ABNORMAL LOW (ref 3.87–5.11)
RDW: 14.8 % (ref 11.5–15.5)
WBC: 19.3 K/uL — ABNORMAL HIGH (ref 4.0–10.5)
nRBC: 0.1 % (ref 0.0–0.2)

## 2024-03-02 SURGERY — LIGATION, FALLOPIAN TUBE, POSTPARTUM
Anesthesia: Epidural | Laterality: Bilateral

## 2024-03-02 MED ORDER — LACTATED RINGERS IV SOLN: INTRAVENOUS | Status: AC

## 2024-03-02 MED ORDER — METOCLOPRAMIDE HCL 10 MG PO TABS
10.0000 mg | ORAL_TABLET | Freq: Once | ORAL | Status: AC
  Administered 2024-03-02: 10 mg via ORAL
  Filled 2024-03-02: qty 1

## 2024-03-02 MED ORDER — FAMOTIDINE 20 MG PO TABS
40.0000 mg | ORAL_TABLET | Freq: Once | ORAL | Status: AC
  Administered 2024-03-02: 40 mg via ORAL
  Filled 2024-03-02: qty 2

## 2024-03-02 NOTE — Progress Notes (Signed)
 Order placed for pt to call in breakfast, but pt declines. Writer explained NPO status until after procedure with the next likely meal being around dinner time. Pt states understanding and does not want to order anything to eat.   BTL time not posted yet, but nightime OR staff mentioned it would be in the afternoon, after the last scheduled case ending at 1500. Report given to Abigail, CHARITY FUNDRAISER.   Torry Adamczak, RN 03/02/2024

## 2024-03-02 NOTE — Progress Notes (Signed)
 Pt verbalized that she does not want to have her BTL today. OR team notified.

## 2024-03-02 NOTE — Progress Notes (Signed)
 Post Partum Day 1 Subjective: no complaints, up ad lib, voiding, and tolerating PO  Objective: Blood pressure 97/76, pulse 92, temperature 98 F (36.7 C), temperature source Oral, resp. rate 18, height 4' 11 (1.499 m), weight 50.4 kg, last menstrual period 06/15/2023, SpO2 100%, unknown if currently breastfeeding.  Physical Exam:  General: alert, cooperative, and no distress Lochia: appropriate Uterine Fundus: firm Incision: n/a DVT Evaluation: No evidence of DVT seen on physical exam. Negative Homan's sign. No cords or calf tenderness.  Recent Labs    02/29/24 2011 03/02/24 0312  HGB 8.9* 8.1*  HCT 28.9* 25.7*    Assessment/Plan: Plan for discharge tomorrow  Risks of procedure discussed with patient including but not limited to: risk of regret, permanence of method, bleeding, infection, injury to surrounding organs and need for additional procedures.  Failure risk of 1 -2 % with increased risk of ectopic gestation if pregnancy occurs was also discussed with patient.     LOS: 2 days   Mary Singleton J Venetia Prewitt, DO 03/02/2024, 10:18 AM

## 2024-03-02 NOTE — Progress Notes (Signed)
 Patient ID: Mary Singleton, female   DOB: 11-02-1995, 28 y.o.   MRN: [id number]  Patient decided not to get PP BTL. Undecided about contraception. Will decide by PPV.  Takenya Travaglini J Shanel Prazak, DO

## 2024-03-02 NOTE — Patient Instructions (Signed)
 If you are interested in an outpatient lactation consultation -- available in-office or virtually -- please reach out to us  at:  [facility] (First Floor) ?? [address] [place], [place]  ?? [phone] Please leave a message on our lactation voicemail box. We welcome any lactation-related questions or concerns -- our team is here to support you and your baby.  Lactation Support Groups Join us  at: [facility] ?? Tuesdays, 10:00 AM - 12:00 PM ?? [address], [employer], [employer]  Lactating parents and lap babies are welcome!  ?? [email or web address]  ?? [email or web address]      Mary Singleton, [facility]

## 2024-03-02 NOTE — Lactation Note (Signed)
 This note was copied from a baby's chart. Lactation Consultation Note  Patient Name: Girl Kaisa Wofford Unijb'd Date: 03/02/2024 Age:26 hours   P2. Mom stated she is just going to formula feed.  Maternal Data    Feeding Nipple Type: Extra Slow Flow  LATCH Score                    Lactation Tools Discussed/Used    Interventions    Discharge    Consult Status Consult Status: Complete    Rehmat Murtagh G 03/02/2024, 1:03 AM

## 2024-03-03 ENCOUNTER — Other Ambulatory Visit (HOSPITAL_COMMUNITY): Payer: Self-pay

## 2024-03-03 MED ORDER — PRENATAL 27-1 MG PO TABS
1.0000 | ORAL_TABLET | Freq: Every day | ORAL | 4 refills | Status: AC
  Filled 2024-03-03: qty 30, 30d supply, fill #0

## 2024-03-03 MED ORDER — SIMETHICONE 80 MG PO CHEW
80.0000 mg | CHEWABLE_TABLET | ORAL | 0 refills | Status: AC | PRN
  Filled 2024-03-03: qty 30, 30d supply, fill #0

## 2024-03-03 MED ORDER — ACETAMINOPHEN 500 MG PO TABS
1000.0000 mg | ORAL_TABLET | Freq: Four times a day (QID) | ORAL | 0 refills | Status: AC
  Filled 2024-03-03: qty 30, 4d supply, fill #0

## 2024-03-03 NOTE — Anesthesia Postprocedure Evaluation (Signed)
"   Anesthesia Post Note  Patient: Mary Singleton  Procedure(s) Performed: AN AD HOC LABOR EPIDURAL     Patient location during evaluation: Mother Baby Anesthesia Type: Epidural Level of consciousness: awake and alert Pain management: pain level controlled Vital Signs Assessment: post-procedure vital signs reviewed and stable Respiratory status: spontaneous breathing, nonlabored ventilation and respiratory function stable Cardiovascular status: stable Postop Assessment: no headache, no backache and epidural receding Anesthetic complications: no   No notable events documented.  Last Vitals:  Vitals:   03/02/24 2110 03/03/24 0500  BP: 94/65 97/76  Pulse: (!) 109 98  Resp: 18 18  Temp: 37.7 C 37.9 C  SpO2: 100% 97%    Last Pain:  Vitals:   03/03/24 0511  TempSrc:   PainSc: 1    Pain Goal:                   Mary Singleton      "

## 2024-03-09 ENCOUNTER — Other Ambulatory Visit

## 2024-03-09 ENCOUNTER — Encounter: Admitting: Women's Health

## 2024-03-10 ENCOUNTER — Telehealth (HOSPITAL_COMMUNITY): Payer: Self-pay | Admitting: *Deleted

## 2024-03-10 NOTE — Telephone Encounter (Signed)
 03/10/2024  Name: Mary Singleton MRN: [id number] DOB: 12/08/1995  Reason for Call:  Transition of Care Hospital Discharge Call  Contact Status: Patient Contact Status: Complete  Language assistant needed: Interpreter Mode: Interpreter Not Needed        Follow-Up Questions: Do You Have Any Concerns About Your Health As You Heal From Delivery?: No Do You Have Any Concerns About Your Infants Health?: No  Edinburgh Postnatal Depression Scale:  In the Past 7 Days:    PHQ2-9 Depression Scale:     Discharge Follow-up: Edinburgh score requires follow up?:  (Patient says answers are the same as in the hospital when score was 0'. Patient endorses she is doing well emotionally.) Patient was advised of the following resources:: Support Group, Breastfeeding Support Group (declines postpartum information via email)  Post-discharge interventions: Reviewed Newborn Safe Sleep Practices  Mliss Sieve, RN 03/10/2024 11:27

## 2024-03-10 NOTE — Telephone Encounter (Signed)
 03/10/2024  Name: Mary Singleton MRN: [id number] DOB: October 26, 1995  Reason for Call:  Transition of Care Hospital Discharge Call  Contact Status: Patient Contact Status: Unable to contact (voice mail box has not been set up yet, unable to leave a message)  Language assistant needed:          Follow-Up Questions:    Van Postnatal Depression Scale:  In the Past 7 Days:    PHQ2-9 Depression Scale:     Discharge Follow-up:    Post-discharge interventions: NA  Mliss Sieve, RN 03/10/2024 11:17

## 2024-03-12 ENCOUNTER — Other Ambulatory Visit

## 2024-03-16 ENCOUNTER — Encounter: Admitting: Women's Health

## 2024-03-16 ENCOUNTER — Other Ambulatory Visit: Admitting: Radiology

## 2024-03-19 ENCOUNTER — Other Ambulatory Visit

## 2024-04-05 ENCOUNTER — Encounter: Payer: Self-pay | Admitting: *Deleted

## 2024-04-06 ENCOUNTER — Ambulatory Visit: Admitting: Advanced Practice Midwife

## 2024-04-20 ENCOUNTER — Ambulatory Visit: Admitting: Advanced Practice Midwife
